# Patient Record
Sex: Female | Born: 1960 | Race: White | Hispanic: No | Smoking: Current every day smoker
Health system: Southern US, Community
[De-identification: ages and names within clinical notes are randomized; demographics above are authoritative.]

## PROBLEM LIST (undated history)

## (undated) DIAGNOSIS — H547 Unspecified visual loss: Secondary | ICD-10-CM

## (undated) DIAGNOSIS — N760 Acute vaginitis: Secondary | ICD-10-CM

## (undated) DIAGNOSIS — F419 Anxiety disorder, unspecified: Secondary | ICD-10-CM

## (undated) DIAGNOSIS — K219 Gastro-esophageal reflux disease without esophagitis: Secondary | ICD-10-CM

## (undated) DIAGNOSIS — F329 Major depressive disorder, single episode, unspecified: Secondary | ICD-10-CM

## (undated) DIAGNOSIS — R519 Headache, unspecified: Secondary | ICD-10-CM

## (undated) DIAGNOSIS — R51 Headache: Secondary | ICD-10-CM

## (undated) DIAGNOSIS — E041 Nontoxic single thyroid nodule: Secondary | ICD-10-CM

## (undated) DIAGNOSIS — F32A Depression, unspecified: Secondary | ICD-10-CM

## (undated) DIAGNOSIS — F4310 Post-traumatic stress disorder, unspecified: Secondary | ICD-10-CM

## (undated) DIAGNOSIS — R569 Unspecified convulsions: Secondary | ICD-10-CM

## (undated) HISTORY — PX: EYE SURGERY: SHX253

## (undated) HISTORY — PX: TUBAL LIGATION: SHX77

## (undated) HISTORY — PX: OTHER SURGICAL HISTORY: SHX169

## (undated) HISTORY — PX: HERNIA REPAIR: SHX51

## (undated) HISTORY — PX: COLONOSCOPY: SHX174

---

## 2003-10-28 HISTORY — PX: TONSILLECTOMY: SUR1361

## 2015-04-26 ENCOUNTER — Inpatient Hospital Stay (HOSPITAL_COMMUNITY)
Admission: AD | Admit: 2015-04-26 | Discharge: 2015-04-26 | Disposition: A | Discharge status: Home / Self Care | Payer: Medicaid - Out of State | Source: Ambulatory Visit | Attending: Obstetrics & Gynecology | Admitting: Obstetrics & Gynecology

## 2015-04-26 ENCOUNTER — Encounter (HOSPITAL_COMMUNITY): Payer: Self-pay | Admitting: *Deleted

## 2015-04-26 DIAGNOSIS — F419 Anxiety disorder, unspecified: Secondary | ICD-10-CM | POA: Insufficient documentation

## 2015-04-26 DIAGNOSIS — L02828 Furuncle of other sites: Secondary | ICD-10-CM | POA: Insufficient documentation

## 2015-04-26 DIAGNOSIS — N904 Leukoplakia of vulva: Secondary | ICD-10-CM

## 2015-04-26 DIAGNOSIS — H6692 Otitis media, unspecified, left ear: Secondary | ICD-10-CM | POA: Diagnosis not present

## 2015-04-26 DIAGNOSIS — F411 Generalized anxiety disorder: Secondary | ICD-10-CM

## 2015-04-26 DIAGNOSIS — Z87891 Personal history of nicotine dependence: Secondary | ICD-10-CM | POA: Diagnosis not present

## 2015-04-26 DIAGNOSIS — R42 Dizziness and giddiness: Secondary | ICD-10-CM

## 2015-04-26 DIAGNOSIS — N362 Urethral caruncle: Secondary | ICD-10-CM

## 2015-04-26 HISTORY — DX: Major depressive disorder, single episode, unspecified: F32.9

## 2015-04-26 HISTORY — DX: Post-traumatic stress disorder, unspecified: F43.10

## 2015-04-26 HISTORY — DX: Nontoxic single thyroid nodule: E04.1

## 2015-04-26 HISTORY — DX: Anxiety disorder, unspecified: F41.9

## 2015-04-26 HISTORY — DX: Depression, unspecified: F32.A

## 2015-04-26 HISTORY — DX: Acute vaginitis: N76.0

## 2015-04-26 HISTORY — DX: Headache: R51

## 2015-04-26 HISTORY — DX: Headache, unspecified: R51.9

## 2015-04-26 LAB — URINALYSIS, ROUTINE W REFLEX MICROSCOPIC
Bilirubin Urine: NEGATIVE
Glucose, UA: NEGATIVE mg/dL
HGB URINE DIPSTICK: NEGATIVE
Ketones, ur: NEGATIVE mg/dL
Leukocytes, UA: NEGATIVE
Nitrite: NEGATIVE
PROTEIN: NEGATIVE mg/dL
Urobilinogen, UA: 0.2 mg/dL (ref 0.0–1.0)
pH: 6 (ref 5.0–8.0)

## 2015-04-26 LAB — WET PREP, GENITAL
CLUE CELLS WET PREP: NONE SEEN
TRICH WET PREP: NONE SEEN
Yeast Wet Prep HPF POC: NONE SEEN

## 2015-04-26 LAB — POCT PREGNANCY, URINE: Preg Test, Ur: NEGATIVE

## 2015-04-26 MED ORDER — PSEUDOEPHEDRINE HCL 30 MG PO TABS: 30.0000 mg | ORAL_TABLET | ORAL | Status: DC | PRN

## 2015-04-26 MED ORDER — AMOXICILLIN-POT CLAVULANATE 875-125 MG PO TABS: 1.0000 | ORAL_TABLET | Freq: Two times a day (BID) | ORAL | Status: DC

## 2015-04-26 MED ORDER — MECLIZINE HCL 32 MG PO TABS: 32.0000 mg | ORAL_TABLET | Freq: Three times a day (TID) | ORAL | Status: DC | PRN
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
## 2015-04-26 NOTE — Discharge Instructions (Signed)
Benign Positional Vertigo °Vertigo means you feel like you or your surroundings are moving when they are not. Benign positional vertigo is the most common form of vertigo. Benign means that the cause of your condition is not serious. Benign positional vertigo is more common in older adults. °CAUSES  °Benign positional vertigo is the result of an upset in the labyrinth system. This is an area in the middle ear that helps control your balance. This may be caused by a viral infection, head injury, or repetitive motion. However, often no specific cause is found. °SYMPTOMS  °Symptoms of benign positional vertigo occur when you move your head or eyes in different directions. Some of the symptoms may include: °· Loss of balance and falls. °· Vomiting. °· Blurred vision. °· Dizziness. °· Nausea. °· Involuntary eye movements (nystagmus). °DIAGNOSIS  °Benign positional vertigo is usually diagnosed by physical exam. If the specific cause of your benign positional vertigo is unknown, your caregiver may perform imaging tests, such as magnetic resonance imaging (MRI) or computed tomography (CT). °TREATMENT  °Your caregiver may recommend movements or procedures to correct the benign positional vertigo. Medicines such as meclizine, benzodiazepines, and medicines for nausea may be used to treat your symptoms. In rare cases, if your symptoms are caused by certain conditions that affect the inner ear, you may need surgery. °HOME CARE INSTRUCTIONS  °· Follow your caregiver's instructions. °· Move slowly. Do not make sudden body or head movements. °· Avoid driving. °· Avoid operating heavy machinery. °· Avoid performing any tasks that would be dangerous to you or others during a vertigo episode. °· Drink enough fluids to keep your urine clear or pale yellow. °SEEK IMMEDIATE MEDICAL CARE IF:  °· You develop problems with walking, weakness, numbness, or using your arms, hands, or legs. °· You have difficulty speaking. °· You develop  severe headaches. °· Your nausea or vomiting continues or gets worse. °· You develop visual changes. °· Your family or friends notice any behavioral changes. °· Your condition gets worse. °· You have a fever. °· You develop a stiff neck or sensitivity to light. °MAKE SURE YOU:  °· Understand these instructions. °· Will watch your condition. °· Will get help right away if you are not doing well or get worse. °Document Released: 07/21/2006 Document Revised: 01/05/2012 Document Reviewed: 07/03/2011 °ExitCare® Patient Information ©2015 [employer]. This information is not intended to replace advice given to you by your health care provider. Make sure you discuss any questions you have with your health care provider. ° °Dizziness ° Dizziness means you feel unsteady or lightheaded. You might feel like you are going to pass out (faint). °HOME CARE  °· Drink enough fluids to keep your pee (urine) clear or pale yellow. °· Take your medicines exactly as told by your doctor. If you take blood pressure medicine, always stand up slowly from the lying or sitting position. Hold on to something to steady yourself. °· If you need to stand in one place for a long time, move your legs often. Tighten and relax your leg muscles. °· Have someone stay with you until you feel okay. °· Do not drive or use heavy machinery if you feel dizzy. °· Do not drink alcohol. °GET HELP RIGHT AWAY IF:  °· You feel dizzy or lightheaded and it gets worse. °· You feel sick to your stomach (nauseous), or you throw up (vomit). °· You have trouble talking or walking. °· You feel weak or have trouble using your arms, hands,   or legs.  You cannot think clearly or have trouble forming sentences.  You have chest pain, belly (abdominal) pain, sweating, or you are short of breath.  Your vision changes.  You are bleeding.  You have problems from your medicine that seem to be getting worse. MAKE SURE YOU:   Understand these instructions.  Will watch  your condition.  Will get help right away if you are not doing well or get worse. Document Released: 10/02/2011 Document Revised: 01/05/2012 Document Reviewed: 10/02/2011 [facility] Patient Information 2015 [place], [place]. This information is not intended to replace advice given to you by your health care provider. Make sure you discuss any questions you have with your health care provider.  Otitis Media Otitis media is redness, soreness, and inflammation of the middle ear. Otitis media may be caused by allergies or, most commonly, by infection. Often it occurs as a complication of the common cold. SIGNS AND SYMPTOMS Symptoms of otitis media may include:  Earache.  Fever.  Ringing in your ear.  Headache.  Leakage of fluid from the ear. DIAGNOSIS To diagnose otitis media, your health care provider will examine your ear with an otoscope. This is an instrument that allows your health care provider to see into your ear in order to examine your eardrum. Your health care provider also will ask you questions about your symptoms. TREATMENT  Typically, otitis media resolves on its own within 3-5 days. Your health care provider may prescribe medicine to ease your symptoms of pain. If otitis media does not resolve within 5 days or is recurrent, your health care provider may prescribe antibiotic medicines if he or she suspects that a bacterial infection is the cause. HOME CARE INSTRUCTIONS   If you were prescribed an antibiotic medicine, finish it all even if you start to feel better.  Take medicines only as directed by your health care provider.  Keep all follow-up visits as directed by your health care provider. SEEK MEDICAL CARE IF:  You have otitis media only in one ear, or bleeding from your nose, or both.  You notice a lump on your neck.  You are not getting better in 3-5 days.  You feel worse instead of better. SEEK IMMEDIATE MEDICAL CARE IF:   You have pain that is not controlled  with medicine.  You have swelling, redness, or pain around your ear or stiffness in your neck.  You notice that part of your face is paralyzed.  You notice that the bone behind your ear (mastoid) is tender when you touch it. MAKE SURE YOU:   Understand these instructions.  Will watch your condition.  Will get help right away if you are not doing well or get worse. Document Released: 07/18/2004 Document Revised: 02/27/2014 Document Reviewed: 05/10/2013 [facility] Patient Information 2015 [place], [place]. This information is not intended to replace advice given to you by your health care provider. Make sure you discuss any questions you have with your health care provider. Lichen Sclerosus Lichen sclerosus is a skin problem. It can happen on any part of the body, but it commonly involves the anal or genital areas. Lichen sclerosus is not an infection or a fungus. Girls and women are more commonly affected than boys and men. CAUSES The cause is not known. It could be the result of an overactive immune system or a lack of certain hormones. Lichen sclerosus is not passed from one person to another (not contagious). SYMPTOMS Your skin may have:  Thin, wrinkled, white areas.  Thickened white areas.  Red and swollen patches.  Tears or cracks.  Bruising.  Blood blisters.  Severe itching. You may also have pain, itching, or burning with urination. Constipation is also common in people with lichen sclerosus. DIAGNOSIS Your caregiver will do a physical exam. Sometimes, a tissue sample (biopsy) may be sent for testing. TREATMENT Treatment may involve putting a thin layer of medicated cream (topical steroid) over the areas with lichen sclerosus. Use the cream only as directed by your caregiver.  HOME CARE INSTRUCTIONS  Only take over-the-counter or prescription medicines as directed by your caregiver.  Keep the vaginal area as clean and dry as possible. SEEK MEDICAL CARE IF: You  develop increasing pain, swelling, or redness. Document Released: 03/05/2011 Document Revised: 01/05/2012 Document Reviewed: 03/05/2011 [facility] Patient Information 2015 [place], [place]. This information is not intended to replace advice given to you by your health care provider. Make sure you discuss any questions you have with your health care provider.

## 2015-04-26 NOTE — MAU Note (Signed)
Urine in lab 
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
## 2015-04-26 NOTE — MAU Note (Signed)
Cherry red urinary meatus for 4 wks, [facility] for a bad vulvar yeast infection- not internal, just external.  [facility]

## 2015-04-26 NOTE — MAU Note (Signed)
136/82     61    Standing   126/93     66    Sitting  108/82     75     Standing

## 2015-05-30 ENCOUNTER — Encounter: Payer: Self-pay | Admitting: Obstetrics & Gynecology

## 2015-05-30 ENCOUNTER — Ambulatory Visit (INDEPENDENT_AMBULATORY_CARE_PROVIDER_SITE_OTHER): Payer: Medicaid - Out of State | Admitting: Obstetrics & Gynecology

## 2015-05-30 VITALS — BP 119/86 | HR 78 | Temp 98.7°F | Wt 211.5 lb

## 2015-05-30 DIAGNOSIS — L900 Lichen sclerosus et atrophicus: Secondary | ICD-10-CM

## 2015-05-30 DIAGNOSIS — B372 Candidiasis of skin and nail: Secondary | ICD-10-CM

## 2015-05-30 DIAGNOSIS — B373 Candidiasis of vulva and vagina: Secondary | ICD-10-CM

## 2015-05-30 LAB — POCT PREGNANCY, URINE: Preg Test, Ur: NEGATIVE

## 2015-05-30 MED ORDER — FLUCONAZOLE 150 MG PO TABS: 150.0000 mg | ORAL_TABLET | Freq: Once | ORAL | Status: DC

## 2015-05-30 MED ORDER — CLOBETASOL PROPIONATE 0.05 % EX CREA: TOPICAL_CREAM | CUTANEOUS | Status: DC

## 2015-05-30 NOTE — Patient Instructions (Signed)
Return to clinic for any scheduled appointments or for any gynecologic concerns as needed.   

## 2015-05-30 NOTE — Progress Notes (Signed)
   CLINIC ENCOUNTER NOTE  History:  54 y.o. [id number] here today for follow up after being evaluated in MAU on 04/26/15 for presumed lichen sclerosus and a urethral furuncle. She has been using clobetasol ointment and some herbal remedies which have helped her symptoms.  Reports having a perianal yeast infection, wants treatment. She denies any abnormal vaginal discharge, bleeding, pelvic pain or other concerns.   Past Medical History  Diagnosis Date  . Anxiety   . Depression   . PTSD (post-traumatic stress disorder)   . Headache     migraines  . Thyroid nodule   . Vaginal infection     Past Surgical History  Procedure Laterality Date  . Tubal ligation    . Hernia repair     The following portions of the patient's history were reviewed and updated as appropriate: allergies, current medications, past family history, past medical history, past social history, past surgical history and problem list.   Health Maintenance:  Normal pap and negative HRHPV on 10/2014.  Normal mammogram on 07/2015.   Review of Systems:  Pertinent items are noted in HPI. Comprehensive review of systems was otherwise negative.  Objective:  Physical Exam BP 119/86 mmHg  Pulse 78  Temp(Src) 98.7 F (37.1 C)  Wt 211 lb 8 oz (95.936 kg)  SpO2 97% CONSTITUTIONAL: Well-developed, well-nourished female in no acute distress.  HENT:  Normocephalic, atraumatic. External right and left ear normal. Oropharynx is clear and moist EYES: Conjunctivae and EOM are normal. Pupils are equal, round, and reactive to light. No scleral icterus.  NECK: Normal range of motion, supple, no masses SKIN: Skin is warm and dry. No rash noted. Not diaphoretic. No erythema. No pallor. NEUROLGIC: Alert and oriented to person, place, and time. Normal reflexes, muscle tone coordination. No cranial nerve deficit noted. PSYCHIATRIC: Normal mood and affect. Normal behavior. Normal judgment and thought content. CARDIOVASCULAR: Normal heart  rate noted RESPIRATORY: Effort and breath sounds normal, no problems with respiration noted ABDOMEN: Soft, no distention noted.   MUSCULOSKELETAL: Normal range of motion. No edema noted. PELVIC: Improved vulvar exam compared to 04/26/15.  Vulva has less loss of pigmentation and even urethral furuncle has improved. No erythema.   No abnormal discharge noted.   Perianal erythema noted concerning for candidal infection.  Assessment & Plan:  Improved vulvar exam s/p clobetasol consistent with lichen sclerosus No indication for biopsy today, will continue to follow. Diflucan prescribed for perianal erythema, she will call/come in for worsening symptoms. Return in 3-6 months for re-evaluation. Routine preventative health maintenance measures emphasized. Please refer to After Visit Summary for other counseling recommendations.    Jaynie Collins, MD, FACOG Attending Obstetrician & Gynecologist Center for [employer], [facility]

## 2015-06-11 ENCOUNTER — Emergency Department (HOSPITAL_COMMUNITY)
Admission: EM | Admit: 2015-06-11 | Discharge: 2015-06-11 | Disposition: A | Discharge status: Home / Self Care | Payer: No Typology Code available for payment source | Attending: Emergency Medicine | Admitting: Emergency Medicine

## 2015-06-11 ENCOUNTER — Emergency Department (HOSPITAL_COMMUNITY): Payer: No Typology Code available for payment source

## 2015-06-11 ENCOUNTER — Encounter (HOSPITAL_COMMUNITY): Payer: Self-pay | Admitting: Emergency Medicine

## 2015-06-11 DIAGNOSIS — F329 Major depressive disorder, single episode, unspecified: Secondary | ICD-10-CM | POA: Diagnosis not present

## 2015-06-11 DIAGNOSIS — S59901A Unspecified injury of right elbow, initial encounter: Secondary | ICD-10-CM | POA: Diagnosis not present

## 2015-06-11 DIAGNOSIS — Y998 Other external cause status: Secondary | ICD-10-CM | POA: Diagnosis not present

## 2015-06-11 DIAGNOSIS — F419 Anxiety disorder, unspecified: Secondary | ICD-10-CM | POA: Diagnosis not present

## 2015-06-11 DIAGNOSIS — Y9289 Other specified places as the place of occurrence of the external cause: Secondary | ICD-10-CM | POA: Insufficient documentation

## 2015-06-11 DIAGNOSIS — Z79899 Other long term (current) drug therapy: Secondary | ICD-10-CM | POA: Diagnosis not present

## 2015-06-11 DIAGNOSIS — Z8639 Personal history of other endocrine, nutritional and metabolic disease: Secondary | ICD-10-CM | POA: Insufficient documentation

## 2015-06-11 DIAGNOSIS — S52121A Displaced fracture of head of right radius, initial encounter for closed fracture: Secondary | ICD-10-CM

## 2015-06-11 DIAGNOSIS — S4991XA Unspecified injury of right shoulder and upper arm, initial encounter: Secondary | ICD-10-CM | POA: Diagnosis present

## 2015-06-11 DIAGNOSIS — W01198A Fall on same level from slipping, tripping and stumbling with subsequent striking against other object, initial encounter: Secondary | ICD-10-CM | POA: Insufficient documentation

## 2015-06-11 DIAGNOSIS — S6991XA Unspecified injury of right wrist, hand and finger(s), initial encounter: Secondary | ICD-10-CM | POA: Insufficient documentation

## 2015-06-11 DIAGNOSIS — Z8742 Personal history of other diseases of the female genital tract: Secondary | ICD-10-CM | POA: Diagnosis not present

## 2015-06-11 DIAGNOSIS — Z87891 Personal history of nicotine dependence: Secondary | ICD-10-CM | POA: Diagnosis not present

## 2015-06-11 DIAGNOSIS — Y9389 Activity, other specified: Secondary | ICD-10-CM | POA: Insufficient documentation

## 2015-06-11 MED ORDER — OXYCODONE-ACETAMINOPHEN 5-325 MG PO TABS: 2.0000 | ORAL_TABLET | Freq: Once | ORAL | Status: DC

## 2015-06-11 MED ORDER — ONDANSETRON HCL 4 MG/2ML IJ SOLN
4.0000 mg | Freq: Once | INTRAMUSCULAR | Status: DC
  Filled 2015-06-11: qty 2

## 2015-06-11 MED ORDER — OXYCODONE-ACETAMINOPHEN 5-325 MG PO TABS
2.0000 | ORAL_TABLET | Freq: Once | ORAL | Status: AC
  Administered 2015-06-11: 2 via ORAL
  Filled 2015-06-11: qty 2

## 2015-06-11 MED ORDER — OXYCODONE-ACETAMINOPHEN 5-325 MG PO TABS: 2.0000 | ORAL_TABLET | ORAL | Status: DC | PRN

## 2015-06-11 MED ORDER — HYDROMORPHONE HCL 1 MG/ML IJ SOLN
2.0000 mg | Freq: Once | INTRAMUSCULAR | Status: DC
  Filled 2015-06-11: qty 2

## 2015-06-11 NOTE — ED Notes (Signed)
Transported patient to Xray

## 2015-06-11 NOTE — Discharge Instructions (Signed)
Radial Head Fracture °A radial head fracture is a break of the smaller bone (radius) in the forearm. The head of this bone is the part near the elbow. These fractures commonly happen during a fall, when you land on an outstretched arm. These fractures are more common in middle aged adults and are common with a dislocation of the elbow. °SYMPTOMS  °· Swelling of the elbow joint and pain on the outside of the elbow. °· Pain and difficulty in bending or straightening the elbow. °· Pain and difficulty in turning the palm of the hand up or down with the elbow bent. °DIAGNOSIS  °Your caregiver may make this diagnosis by a physical exam. X-rays can confirm the type and amount of fracture. Sometimes a fracture that is not displaced cannot be seen on the original X-ray. °TREATMENT  °Radial head fractures are classified according to the amount of movement (displacement) of parts from the normal position.  °Type 1 Fractures °· Type 1 fractures are generally small fractures in which bone pieces remain together (nondisplaced fracture). °· The fracture may not be seen on initial X-rays. Usually if X-rays are repeated two to three weeks later, the fracture will show up. A splint or sling is used for a few days. Gentle early motion is used to prevent the elbow from becoming stiff. It should not be done vigorously or forced as this could displace the bone pieces. °Type 2 Fractures °· With type 2 fractures, bone pieces are slightly displaced and larger pieces of bone are broken off. °· If only a little displacement of the bone piece is present, splinting for 4 to 5 days usually works well. This is again followed with gentle active range of motion. Small fragments may be surgically removed. °· Large pieces of bone that can be put back into place will sometimes be fixed with pins or screws to hold them until the bone is healed. If this cannot be done, the fragments are removed. For older, less active people, sometimes the entire radial  head is removed if the wrist is not injured. The elbow and arm will still work fine. Soft tissue, tendon, and ligament injuries are corrected at the same time. °Type 3 Fractures °· Type 3 fractures have multiple broken pieces of bone that cannot be fixed. Surgery is usually needed to remove the broken bits of bone and what is left of the radial head. Soft-tissue damage is repaired. Gentle early motion is used to prevent the elbow from becoming stiff. Sometimes an artificial radial head can be used to prevent deformity if the elbow is unstable. °Rest, ice, elevation, immobilization, medications, and pain control are used in the early care. °HOME CARE INSTRUCTIONS  °· Keep the injured part elevated while sitting or lying down. Keep the injury above the level of your heart (the center of the chest). This will decrease swelling and pain. °· Apply ice to the injury for 15-20 minutes, 03-04 times per day while awake, for 2 days. Put the ice in a plastic bag and place a towel between the bag of ice and your cast or splint. °· Move your fingers to avoid stiffness and minimize swelling. °· If you have a plaster or fiberglass cast: °¨ Do not try to scratch the skin under the cast using sharp or pointed objects. °¨ Check the skin around the cast every day. You may put lotion on any red or sore areas. °¨ Keep your cast dry and clean. °· If you have a plaster splint: °¨   Wear the splint as directed. °¨ You may loosen the elastic around the splint if your fingers become numb, tingle, or turn cold or blue. °· Do not put pressure on any part of your cast or splint. It may break. Rest your cast only on a pillow for the first 24 hours until it is fully hardened. °· Your cast or splint can be protected during bathing with a plastic bag. Do not lower the cast or splint into the water. °· Only take over-the-counter or prescription medicines for pain, discomfort, or fever as directed by your caregiver. °· Follow all instructions for  follow-up with your caregiver. This includes any orthopedic referrals, physical therapy, and rehabilitation. Any delay in obtaining necessary care could result in a delay or failure of the bones to heal or permanent elbow stiffness. °· Do not overdo exercises. This could further damage your injury. °SEEK IMMEDIATE MEDICAL CARE IF:  °· Your cast or splint gets damaged or breaks. °· You have more severe pain or swelling than you did before getting the cast. °· You have severe pain when stretching your fingers. °· There is a bad smell, new stains, and/or pus-like (purulent) drainage coming from under the cast. °· Your fingers or hand turn pale or blue, become cold, or you lose feeling. °Document Released: 08/04/2006 Document Revised: 02/27/2014 Document Reviewed: 09/11/2009 °[employer]® Patient Information ©2015 [employer]. This information is not intended to replace advice given to you by your health care provider. Make sure you discuss any questions you have with your health care provider. ° °

## 2015-06-11 NOTE — Progress Notes (Signed)
Orthopedic Tech Progress Note Patient Details:  Sandra Herring 10-03-1961 [id number]  Ortho Devices Type of Ortho Device: Ace wrap, Arm sling, Post (long arm) splint Ortho Device/Splint Location: RUE Ortho Device/Splint Interventions: Ordered, Application   Jennye Moccasin 06/11/2015, 7:48 PM

## 2015-06-11 NOTE — ED Provider Notes (Signed)
CSN: [id number]     Arrival date & time 06/11/15  1723 History  This chart was scribed for Trisha Mangle, PA-C, working with Lyndal Pulley, MD by Elon Spanner, ED Scribe. This patient was seen in room TR10C/TR10C and the patient's care was started at 5:33 PM.   No chief complaint on file.  The history is provided by the patient. No language interpreter was used.   HPI Comments: Sandra Herring is a 54 y.o. female who presents to the Emergency Department complaining of constant, unchanged, severe right elbow and wrist pain onset PTA after a fall.  The patient was a gas station when she tripped and fell, catching herself on an outstretched right hand.  She currently takes sertraline, valium, trazadone, and propanolol.  Patient is new to the area and does not have primary care.  NKA.    Past Medical History  Diagnosis Date  . Anxiety   . Depression   . PTSD (post-traumatic stress disorder)   . Headache     migraines  . Thyroid nodule   . Vaginal infection    Past Surgical History  Procedure Laterality Date  . Tubal ligation    . Hernia repair     No family history on file. Social History  Substance Use Topics  . Smoking status: Former Smoker -- 0.25 packs/day    Quit date: 10/20/2014  . Smokeless tobacco: Never Used  . Alcohol Use: Yes   OB History    Gravida Para Term Preterm AB TAB SAB Ectopic Multiple Living   Review of Systems  Constitutional: Negative for fever.  Musculoskeletal: Positive for joint swelling and arthralgias.      Allergies  Serzone  Home Medications   Prior to Admission medications   Medication Sig Start Date End Date Taking? Authorizing Provider  acetaminophen (TYLENOL) 500 MG tablet Take 1,000 mg by mouth every 6 (six) hours as needed for moderate pain.    Historical Provider, MD  amoxicillin-clavulanate (AUGMENTIN) 875-125 MG per tablet Take 1 tablet by mouth every 12 (twelve) hours. Patient not taking: Reported on 05/30/2015  04/26/15   Aviva Signs, CNM  clobetasol cream (TEMOVATE) 0.05 % Apply to affected area two-three times a week 05/30/15   Tereso Newcomer, MD  diazepam (VALIUM) 5 MG tablet Take 2.5-5 mg by mouth as needed for anxiety.    Historical Provider, MD  diphenhydrAMINE (BENADRYL) 25 MG tablet Take 25 mg by mouth every 6 (six) hours as needed for allergies.    Historical Provider, MD  famotidine (PEPCID) 20 MG tablet Take 20 mg by mouth 2 (two) times daily.    Historical Provider, MD  fluconazole (DIFLUCAN) 150 MG tablet Take 1 tablet (150 mg total) by mouth once. Can take additional dose three days later if symptoms persist 05/30/15   Tereso Newcomer, MD  meclizine (ANTIVERT) 32 MG tablet Take 1 tablet (32 mg total) by mouth 3 (three) times daily as needed. Patient not taking: Reported on 05/30/2015 04/26/15   Aviva Signs, CNM  propranolol (INDERAL) 20 MG tablet Take 20 mg by mouth daily.    Historical Provider, MD  pseudoephedrine (SUDAFED) 30 MG tablet Take 1 tablet (30 mg total) by mouth every 4 (four) hours as needed for congestion. Patient not taking: Reported on 05/30/2015 04/26/15   Aviva Signs, CNM  sertraline (ZOLOFT) 25 MG tablet Take 25-50 mg by mouth daily. Patient is  working up to 50mg  Zoloft, took 25mg  today    Ecologist, MD  traZODone (DESYREL) 50 MG tablet Take 50 mg by mouth at bedtime as needed for sleep.    Historical Provider, MD   There were no vitals taken for this visit. Physical Exam  Constitutional: She is oriented to person, place, and time. She appears well-developed and well-nourished. No distress.  HENT:  Head: Normocephalic and atraumatic.  Eyes: Conjunctivae and EOM are normal.  Neck: Neck supple. No tracheal deviation present.  Cardiovascular: Normal rate.   Pulmonary/Chest: Effort normal. No respiratory distress.  Musculoskeletal: Normal range of motion.  Tender to right elbow, wrist.  Pain with ROM.  NVI.  Neurological: She is alert and oriented to  person, place, and time.  Skin: Skin is warm and dry.  Psychiatric: She has a normal mood and affect. Her behavior is normal.  Nursing note and vitals reviewed.   ED Course  Procedures (including critical care time)  DIAGNOSTIC STUDIES: Oxygen Saturation is 97% on RA, normal by my interpretation.    COORDINATION OF CARE:  5:40 PM Discussed treatment plan with patient at bedside.  Patient acknowledges and agrees with plan.    Labs Review Labs Reviewed - No data to display  Imaging Review Dg Elbow Complete Right  06/11/2015   CLINICAL DATA:  Fall today on out stetched right arm. Right arm pain from distal humerus to wrist. Pt in a lot of pain, unable to bend elbow to 90 degrees  EXAM: RIGHT ELBOW - COMPLETE 3+ VIEW  COMPARISON:  None.  FINDINGS: There is a fracture through the radial head, with a fracture line extending across the articular surface in an oblique sagittal plane and another fracture line across the radial neck. No significant fracture comminution or displacement. No other fractures. Elbow joint is normally aligned. There is a joint effusion.  IMPRESSION: Nondisplaced fracture of the radial head.   Electronically Signed   By: Amie Portland M.D.   On: 06/11/2015 18:36   Dg Forearm Right  06/11/2015   CLINICAL DATA:  Fall on outstretched right arm. Right forearm pain. Initial encounter.  EXAM: RIGHT FOREARM - 2 VIEW  COMPARISON:  None.  FINDINGS: Minimally displaced radial head fracture is demonstrated. No other fracture seen involving the radius or ulna. No evidence of dislocation.  IMPRESSION: Minimally displaced radial head fracture.   Electronically Signed   By: Myles Rosenthal M.D.   On: 06/11/2015 18:37   Dg Wrist Complete Right  06/11/2015   CLINICAL DATA:  Fall on outstretched arm injury. Arm pain extending from humerus to wrist.  EXAM: RIGHT WRIST - COMPLETE 3+ VIEW  COMPARISON:  None.  FINDINGS: There is no evidence of fracture or dislocation. There is no evidence of  arthropathy or other focal bone abnormality. Soft tissues are unremarkable.  IMPRESSION: Negative.   Electronically Signed   By: Andreas Newport M.D.   On: 06/11/2015 18:39   I personally reviewed and evaluated these images and lab results as part of my medical decision-making.   EKG Interpretation None      MDM   Final diagnoses:  Radial head fracture, closed, right, initial encounter    Pt placed in a splint and sling Care Management spoke with pt about follow up Percocet   I personally performed the services in this documentation, which was scribed in my presence.  The recorded information has been reviewed and considered.   Barnet Pall.  Lonia Skinner [place], PA-C 06/12/15 [phone]  Lyndal Pulley, MD 06/12/15 [phone]

## 2015-06-11 NOTE — ED Notes (Signed)
Pt was walking and tripped and broke her fall w/ her right arm.

## 2015-07-11 ENCOUNTER — Encounter: Payer: Self-pay | Admitting: Obstetrics & Gynecology

## 2015-11-01 ENCOUNTER — Emergency Department (HOSPITAL_COMMUNITY): Payer: BLUE CROSS/BLUE SHIELD

## 2015-11-01 ENCOUNTER — Emergency Department (HOSPITAL_COMMUNITY)
Admission: EM | Admit: 2015-11-01 | Discharge: 2015-11-01 | Disposition: A | Discharge status: Home / Self Care | Payer: BLUE CROSS/BLUE SHIELD | Attending: Emergency Medicine | Admitting: Emergency Medicine

## 2015-11-01 ENCOUNTER — Encounter (HOSPITAL_COMMUNITY): Payer: Self-pay | Admitting: Neurology

## 2015-11-01 DIAGNOSIS — F329 Major depressive disorder, single episode, unspecified: Secondary | ICD-10-CM | POA: Diagnosis not present

## 2015-11-01 DIAGNOSIS — Z8742 Personal history of other diseases of the female genital tract: Secondary | ICD-10-CM | POA: Diagnosis not present

## 2015-11-01 DIAGNOSIS — R112 Nausea with vomiting, unspecified: Secondary | ICD-10-CM | POA: Diagnosis not present

## 2015-11-01 DIAGNOSIS — R1013 Epigastric pain: Secondary | ICD-10-CM

## 2015-11-01 DIAGNOSIS — F419 Anxiety disorder, unspecified: Secondary | ICD-10-CM | POA: Diagnosis not present

## 2015-11-01 DIAGNOSIS — Z79899 Other long term (current) drug therapy: Secondary | ICD-10-CM | POA: Diagnosis not present

## 2015-11-01 DIAGNOSIS — Z8639 Personal history of other endocrine, nutritional and metabolic disease: Secondary | ICD-10-CM | POA: Insufficient documentation

## 2015-11-01 DIAGNOSIS — Z87891 Personal history of nicotine dependence: Secondary | ICD-10-CM | POA: Insufficient documentation

## 2015-11-01 DIAGNOSIS — R109 Unspecified abdominal pain: Secondary | ICD-10-CM | POA: Diagnosis present

## 2015-11-01 LAB — COMPREHENSIVE METABOLIC PANEL
ALT: 23 U/L (ref 14–54)
AST: 19 U/L (ref 15–41)
Albumin: 4.3 g/dL (ref 3.5–5.0)
Alkaline Phosphatase: 70 U/L (ref 38–126)
Anion gap: 8 (ref 5–15)
BUN: 9 mg/dL (ref 6–20)
CHLORIDE: 103 mmol/L (ref 101–111)
CO2: 29 mmol/L (ref 22–32)
Calcium: 9.7 mg/dL (ref 8.9–10.3)
Creatinine, Ser: 0.82 mg/dL (ref 0.44–1.00)
GFR calc Af Amer: >60 mL/min (ref >60)
GFR calc non Af Amer: >60 mL/min (ref >60)
Glucose, Bld: 111 mg/dL — ABNORMAL HIGH (ref 65–99)
POTASSIUM: 4.1 mmol/L (ref 3.5–5.1)
Sodium: 140 mmol/L (ref 135–145)
Total Bilirubin: 0.6 mg/dL (ref 0.3–1.2)
Total Protein: 7 g/dL (ref 6.5–8.1)

## 2015-11-01 LAB — CBC
HEMATOCRIT: 39.5 % (ref 36.0–46.0)
Hemoglobin: 12.8 g/dL (ref 12.0–15.0)
MCH: 29.1 pg (ref 26.0–34.0)
MCHC: 32.4 g/dL (ref 30.0–36.0)
MCV: 89.8 fL (ref 78.0–100.0)
Platelets: 212 10*3/uL (ref 150–400)
RBC: 4.4 MIL/uL (ref 3.87–5.11)
RDW: 12.3 % (ref 11.5–15.5)
WBC: 5.4 10*3/uL (ref 4.0–10.5)

## 2015-11-01 LAB — I-STAT TROPONIN, ED: TROPONIN I, POC: 0 ng/mL (ref 0.00–0.08)

## 2015-11-01 LAB — LIPASE, BLOOD: LIPASE: 34 U/L (ref 11–51)

## 2015-11-01 MED ORDER — GI COCKTAIL ~~LOC~~
30.0000 mL | Freq: Once | ORAL | Status: AC
  Administered 2015-11-01: 30 mL via ORAL
  Filled 2015-11-01: qty 30

## 2015-11-01 MED ORDER — LIDOCAINE VISCOUS 2 % MT SOLN: 15.0000 mL | Freq: Four times a day (QID) | OROMUCOSAL | Status: DC | PRN

## 2015-11-01 MED ORDER — PANTOPRAZOLE SODIUM 20 MG PO TBEC: 20.0000 mg | DELAYED_RELEASE_TABLET | Freq: Every day | ORAL | Status: DC

## 2015-11-01 NOTE — ED Provider Notes (Signed)
CSN: [id number]     Arrival date & time 11/01/15  0913 History  By signing my name below, I, Freida Busman, attest that this documentation has been prepared under the direction and in the presence of non-physician practitioner, Rhea Bleacher PA-C. Electronically Signed: Freida Busman, Scribe. 11/01/2015. 2:50 PM.    Chief Complaint  Patient presents with  . Abdominal Pain    The history is provided by the patient. No language interpreter was used.    HPI Comments:  Sandra Herring is a 55 y.o. female with a history of hernia repair (1994), who presents to the Emergency Department complaining of non-radiating burning mid abdominal pain. She notes her pain began after taking levaquin and prednisone which she was placed on for PNA in October 2016. She notes in the past prednisone has caused gastritis.  Pain is most severe in the middle the night. She reports associated sporadic episodes of nausea and vomiting. She was recently evaluated at Urgent Care for the pain, had negative H. Pylori and was given GI cocktail with relief. She was discharged with dexilant which provided no relief. She also notes her pain is improved after eating and she often wakes up with sour taste in her mouth. She reports h/o gallbladder issues. No fever or diarrhea.     Past Medical History  Diagnosis Date  . Anxiety   . Depression   . PTSD (post-traumatic stress disorder)   . Headache     migraines  . Thyroid nodule   . Vaginal infection    Past Surgical History  Procedure Laterality Date  . Tubal ligation    . Hernia repair     No family history on file. Social History  Substance Use Topics  . Smoking status: Former Smoker -- 0.25 packs/day    Quit date: 10/20/2014  . Smokeless tobacco: Never Used  . Alcohol Use: Yes     Comment: occ   OB History    Gravida Para Term Preterm AB TAB SAB Ectopic Multiple Living   3 3 3       3      Review of Systems  Constitutional: Negative for fever.  HENT: Positive  for trouble swallowing (No regurgitation). Negative for rhinorrhea and sore throat.   Eyes: Negative for redness.  Respiratory: Negative for cough.   Cardiovascular: Negative for chest pain.  Gastrointestinal: Positive for nausea, vomiting and abdominal pain. Negative for diarrhea.  Genitourinary: Negative for dysuria.  Musculoskeletal: Negative for myalgias.  Skin: Negative for rash.  Neurological: Negative for headaches.   Allergies  Serzone  Home Medications   Prior to Admission medications   Medication Sig Start Date End Date Taking? Authorizing Provider  acetaminophen (TYLENOL) 500 MG tablet Take 1,000 mg by mouth every 6 (six) hours as needed for moderate pain.    Historical Provider, MD  amoxicillin-clavulanate (AUGMENTIN) 875-125 MG per tablet Take 1 tablet by mouth every 12 (twelve) hours. Patient not taking: Reported on 05/30/2015 04/26/15   Aviva Signs, CNM  clobetasol cream (TEMOVATE) 0.05 % Apply to affected area two-three times a week 05/30/15   Tereso Newcomer, MD  diazepam (VALIUM) 5 MG tablet Take 2.5-5 mg by mouth as needed for anxiety.    Historical Provider, MD  diphenhydrAMINE (BENADRYL) 25 MG tablet Take 25 mg by mouth every 6 (six) hours as needed for allergies.    Historical Provider, MD  famotidine (PEPCID) 20 MG tablet Take 20 mg by mouth 2 (two) times daily.  Historical Provider, MD  fluconazole (DIFLUCAN) 150 MG tablet Take 1 tablet (150 mg total) by mouth once. Can take additional dose three days later if symptoms persist 05/30/15   Tereso NewcomerUgonna A Anyanwu, MD  meclizine (ANTIVERT) 32 MG tablet Take 1 tablet (32 mg total) by mouth 3 (three) times daily as needed. Patient not taking: Reported on 05/30/2015 04/26/15   Aviva SignsMarie L Williams, CNM  oxyCODONE-acetaminophen (PERCOCET/ROXICET) 5-325 MG per tablet Take 2 tablets by mouth every 4 (four) hours as needed for severe pain. 06/11/15   Elson AreasLeslie K Sofia, PA-C  propranolol (INDERAL) 20 MG tablet Take 20 mg by mouth daily.     Historical Provider, MD  pseudoephedrine (SUDAFED) 30 MG tablet Take 1 tablet (30 mg total) by mouth every 4 (four) hours as needed for congestion. Patient not taking: Reported on 05/30/2015 04/26/15   Aviva SignsMarie L Williams, CNM  sertraline (ZOLOFT) 25 MG tablet Take 25-50 mg by mouth daily. Patient is working up to 50mg  Zoloft, took 25mg  today    EcologistHistorical Provider, MD  traZODone (DESYREL) 50 MG tablet Take 50 mg by mouth at bedtime as needed for sleep.    Historical Provider, MD   BP 134/96 mmHg  Pulse 65  Temp(Src) 97.9 F (36.6 C) (Oral)  Resp 22  Ht 5' 8.5" (1.74 m)  Wt 224 lb (101.606 kg)  BMI 33.56 kg/m2  SpO2 96%   Physical Exam  Constitutional: She is oriented to person, place, and time. She appears well-developed and well-nourished. No distress.  HENT:  Head: Normocephalic and atraumatic.  Mouth/Throat: Oropharynx is clear and moist.  Eyes: Conjunctivae are normal. Right eye exhibits no discharge. Left eye exhibits no discharge.  Neck: Normal range of motion. Neck supple.  Cardiovascular: Normal rate, regular rhythm and normal heart sounds.   No murmur heard. Pulmonary/Chest: Effort normal and breath sounds normal. No respiratory distress. She has no wheezes. She has no rales.  Abdominal: Soft. She exhibits no distension. There is tenderness (epigastrium, mild to moderate).  Neurological: She is alert and oriented to person, place, and time.  Skin: Skin is warm and dry.  Psychiatric: She has a normal mood and affect.  Nursing note and vitals reviewed.   ED Course  Procedures   DIAGNOSTIC STUDIES:  Oxygen Saturation is 96% on RA, normal by my interpretation.    COORDINATION OF CARE:  2:32 PM Will order abdominal US and GI cocktail and discharge with PPI. Discussed treatment plan with pt at bedside and pt agreed to plan.  Labs Review Labs Reviewed  COMPREHENSIVE METABOLIC PANEL - Abnormal; Notable for the following:    Glucose, Bld 111 (*)    All other components  within normal limits  LIPASE, BLOOD  CBC  I-STAT TROPOININ, ED    Imaging Review [place] Abdomen Complete  11/01/2015  CLINICAL DATA:  Upper abdominal pain for 3 months EXAM: ABDOMEN ULTRASOUND COMPLETE COMPARISON:  None FINDINGS: Gallbladder: Normally distended without stones or wall thickening. No pericholecystic fluid or sonographic Murphy sign. Common bile duct: Diameter: 2 mm diameter , normal Liver: Normal appearance IVC: Normal appearance Pancreas: Normal appearance Spleen: Normal appearance, 8.3 cm length Right Kidney: Length: 12.1 cm. Normal morphology without mass or hydronephrosis. Left Kidney: Length: 11.5 cm. Normal morphology without mass or hydronephrosis. Abdominal aorta: Mild atherosclerotic changes.  Normal caliber. Other findings: No free fluid IMPRESSION: No acute upper abdominal sonographic abnormalities. Electronically Signed   By: Ulyses SouthwardMark  Boles M.D.   On: 11/01/2015 16:29   I have personally reviewed  and evaluated these images and lab results as part of my medical decision-making.  Vital signs reviewed and are as follows: Filed Vitals:   11/01/15 0949  BP: 134/96  Pulse: 65  Temp: 97.9 F (36.6 C)  Resp: 22   5:03 PM ultrasound is unremarkable. Will start on Protonix and give viscous lidocaine. Patient states that her primary care doctor has called in her prescription for Carafate. She has Pepcid which she will continue to take.  Referral to gastroenterology given for consideration of EGD.  The patient was urged to return to the Emergency Department immediately with worsening of current symptoms, worsening abdominal pain, persistent vomiting, blood noted in stools, fever, or any other concerns. The patient verbalized understanding.    MDM   Final diagnoses:  Epigastric pain   Patient with epigastric pain with features most consistent with peptic ulcer disease or gastritis. Ultrasound performed to rule out possibility of gallbladder disease. This was negative. Lab work  is otherwise unremarkable. Patient discharged with GI follow-up. Return instructions as above. Treatment as above.  I personally performed the services described in this documentation, which was scribed in my presence. The recorded information has been reviewed and is accurate.    Renne Crigler, PA-C 11/01/15 1704  Pricilla Loveless, MD 11/02/15 [phone]

## 2015-11-01 NOTE — ED Notes (Signed)
Pt states she has been experiencing epigastric pain since Oct when she was tx for pnx with levaquin and prednisone.  [facility] and was given 4 days of dexilant with  No relief.

## 2015-11-01 NOTE — Discharge Instructions (Signed)
Please read and follow all provided instructions.  Your diagnoses today include:  1. Epigastric pain     Tests performed today include:  Blood counts and electrolytes  Blood tests to check liver and kidney function  Blood tests to check pancreas function  Ultrasound - no gallbladder problems  Vital signs. See below for your results today.   Medications prescribed:   Protonix - stomach acid reducer  Take any prescribed medications only as directed.  Home care instructions:   Follow any educational materials contained in this packet.  Use of famotidine and Carafate as prescribed by your doctor.   Follow-up instructions: Please follow-up with the gastroenterologist in the next 7 days for further evaluation of your symptoms.    Return instructions:  SEEK IMMEDIATE MEDICAL ATTENTION IF:  The pain does not go away or becomes severe   A temperature above 101F develops   Repeated vomiting occurs (multiple episodes)   The pain becomes localized to portions of the abdomen. The right side could possibly be appendicitis. In an adult, the left lower portion of the abdomen could be colitis or diverticulitis.   Blood is being passed in stools or vomit (bright red or black tarry stools)   You develop chest pain, difficulty breathing, dizziness or fainting, or become confused, poorly responsive, or inconsolable (young children)  If you have any other emergent concerns regarding your health  Additional Information: Abdominal (belly) pain can be caused by many things. Your caregiver performed an examination and possibly ordered blood/urine tests and imaging (CT scan, x-rays, ultrasound). Many cases can be observed and treated at home after initial evaluation in the emergency department. Even though you are being discharged home, abdominal pain can be unpredictable. Therefore, you need a repeated exam if your pain does not resolve, returns, or worsens. Most patients with abdominal pain  don't have to be admitted to the hospital or have surgery, but serious problems like appendicitis and gallbladder attacks can start out as nonspecific pain. Many abdominal conditions cannot be diagnosed in one visit, so follow-up evaluations are very important.  Your vital signs today were: BP 134/96 mmHg   Pulse 65   Temp(Src) 97.9 F (36.6 C) (Oral)   Resp 22   Ht 5' 8.5" (1.74 m)   Wt 101.606 kg   BMI 33.56 kg/m2   SpO2 96% If your blood pressure (bp) was elevated above 135/85 this visit, please have this repeated by your doctor within one month. --------------

## 2015-11-13 ENCOUNTER — Other Ambulatory Visit: Payer: Self-pay | Admitting: Gastroenterology

## 2015-11-13 DIAGNOSIS — R1013 Epigastric pain: Secondary | ICD-10-CM

## 2015-11-21 ENCOUNTER — Ambulatory Visit (HOSPITAL_COMMUNITY)
Admission: RE | Admit: 2015-11-21 | Discharge: 2015-11-21 | Disposition: A | Discharge status: Home / Self Care | Payer: BLUE CROSS/BLUE SHIELD | Source: Ambulatory Visit | Attending: Gastroenterology | Admitting: Gastroenterology

## 2015-11-21 DIAGNOSIS — R1013 Epigastric pain: Secondary | ICD-10-CM | POA: Diagnosis not present

## 2015-11-21 MED ORDER — SINCALIDE 5 MCG IJ SOLR
0.0200 ug/kg | Freq: Once | INTRAMUSCULAR | Status: AC
  Administered 2015-11-21: 5 ug via INTRAVENOUS

## 2015-11-21 MED ORDER — SINCALIDE 5 MCG IJ SOLR
INTRAMUSCULAR | Status: AC
  Administered 2015-11-21: 5 ug via INTRAVENOUS
  Filled 2015-11-21: qty 5

## 2015-11-21 MED ORDER — TECHNETIUM TC 99M MEBROFENIN IV KIT
5.2000 | PACK | Freq: Once | INTRAVENOUS | Status: AC | PRN
  Administered 2015-11-21: 5 via INTRAVENOUS

## 2016-07-31 ENCOUNTER — Other Ambulatory Visit: Payer: Self-pay | Admitting: Orthopaedic Surgery

## 2016-07-31 DIAGNOSIS — M5416 Radiculopathy, lumbar region: Secondary | ICD-10-CM

## 2016-08-18 ENCOUNTER — Inpatient Hospital Stay: Admission: RE | Admit: 2016-08-18 | Payer: No Typology Code available for payment source | Source: Ambulatory Visit

## 2016-08-21 ENCOUNTER — Other Ambulatory Visit: Payer: No Typology Code available for payment source

## 2016-08-26 ENCOUNTER — Ambulatory Visit
Admission: RE | Admit: 2016-08-26 | Discharge: 2016-08-26 | Disposition: A | Discharge status: Home / Self Care | Payer: BLUE CROSS/BLUE SHIELD | Source: Ambulatory Visit | Attending: Orthopaedic Surgery | Admitting: Orthopaedic Surgery

## 2016-08-26 DIAGNOSIS — M5416 Radiculopathy, lumbar region: Secondary | ICD-10-CM

## 2016-09-22 IMAGING — DX DG ELBOW COMPLETE 3+V*R*
4 series · 4 of 4 positions shown · non-contrast
Comparison: None.

CLINICAL DATA: Fall today on out stetched right arm. Right arm pain
from distal humerus to wrist. Pt in a lot of pain, unable to bend
elbow to 90 degrees

EXAM:
RIGHT ELBOW - COMPLETE 3+ VIEW

[x elbow obl right (1 of 2)]
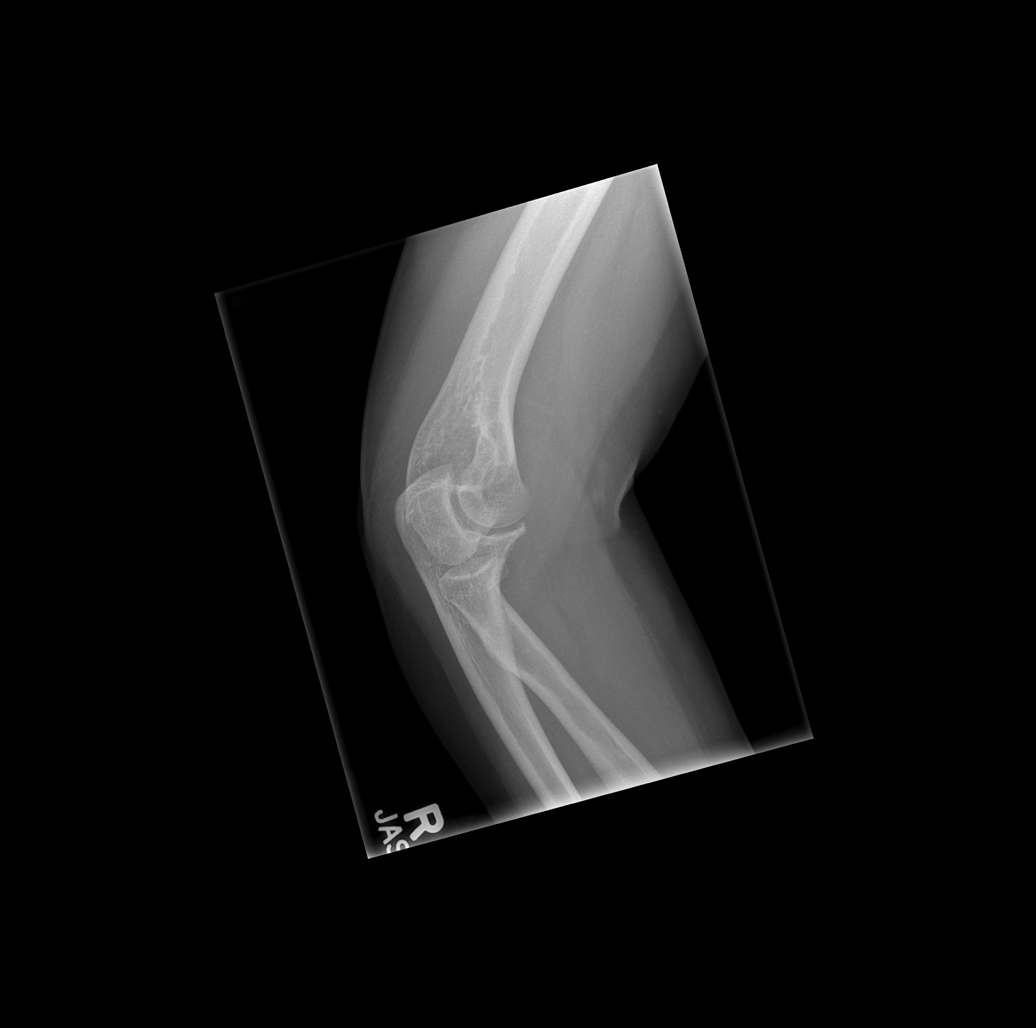

[x elbow ap right]
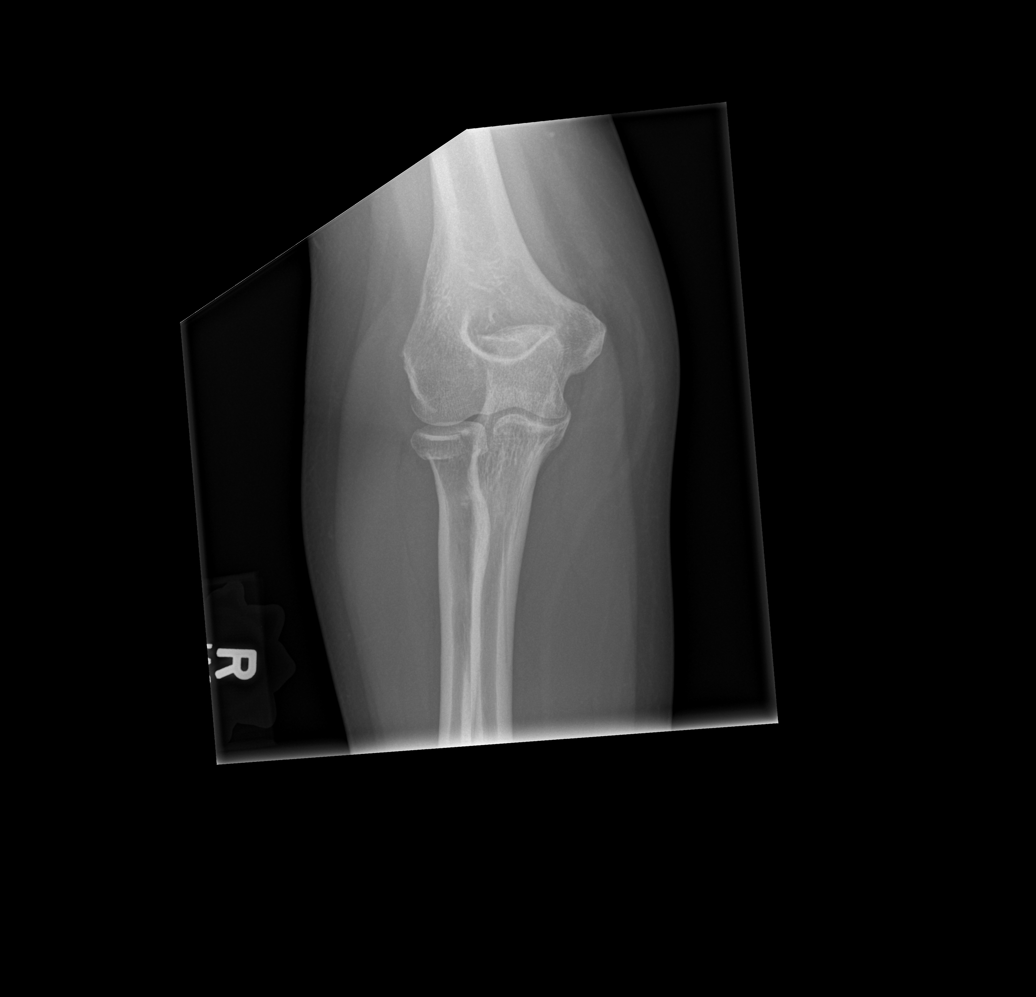

[x elbow obl right (2 of 2)]
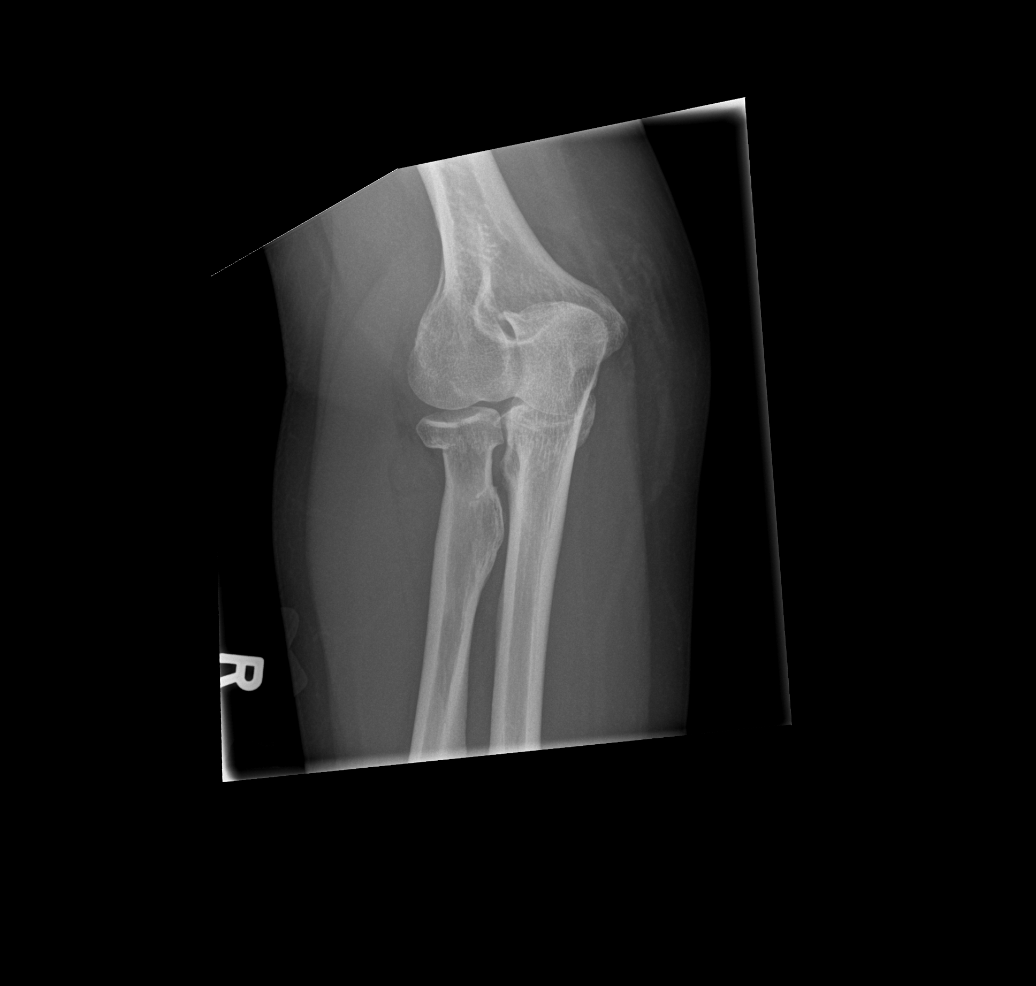

[x elbow lat right]
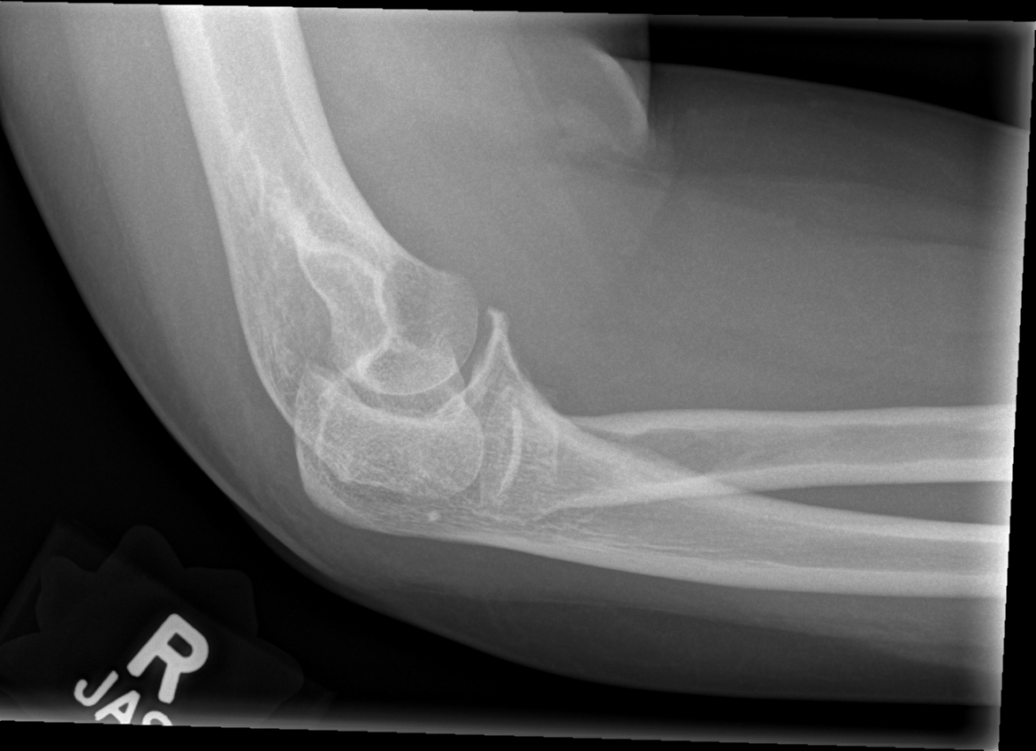

[4 of 4 positions shown; findings below may reference images not displayed]

FINDINGS: There is a fracture through the radial head, with a fracture line
extending across the articular surface in an oblique sagittal plane
and another fracture line across the radial neck. No significant
fracture comminution or displacement. No other fractures. Elbow
joint is normally aligned. There is a joint effusion.
IMPRESSION: Nondisplaced fracture of the radial head.

## 2016-09-22 IMAGING — DX DG FOREARM 2V*R*
3 series · 3 of 3 positions shown · non-contrast
Comparison: None.

CLINICAL DATA: Fall on outstretched right arm. Right forearm pain.
Initial encounter.

EXAM:
RIGHT FOREARM - 2 VIEW

[x forearm ap right (1 of 2)]
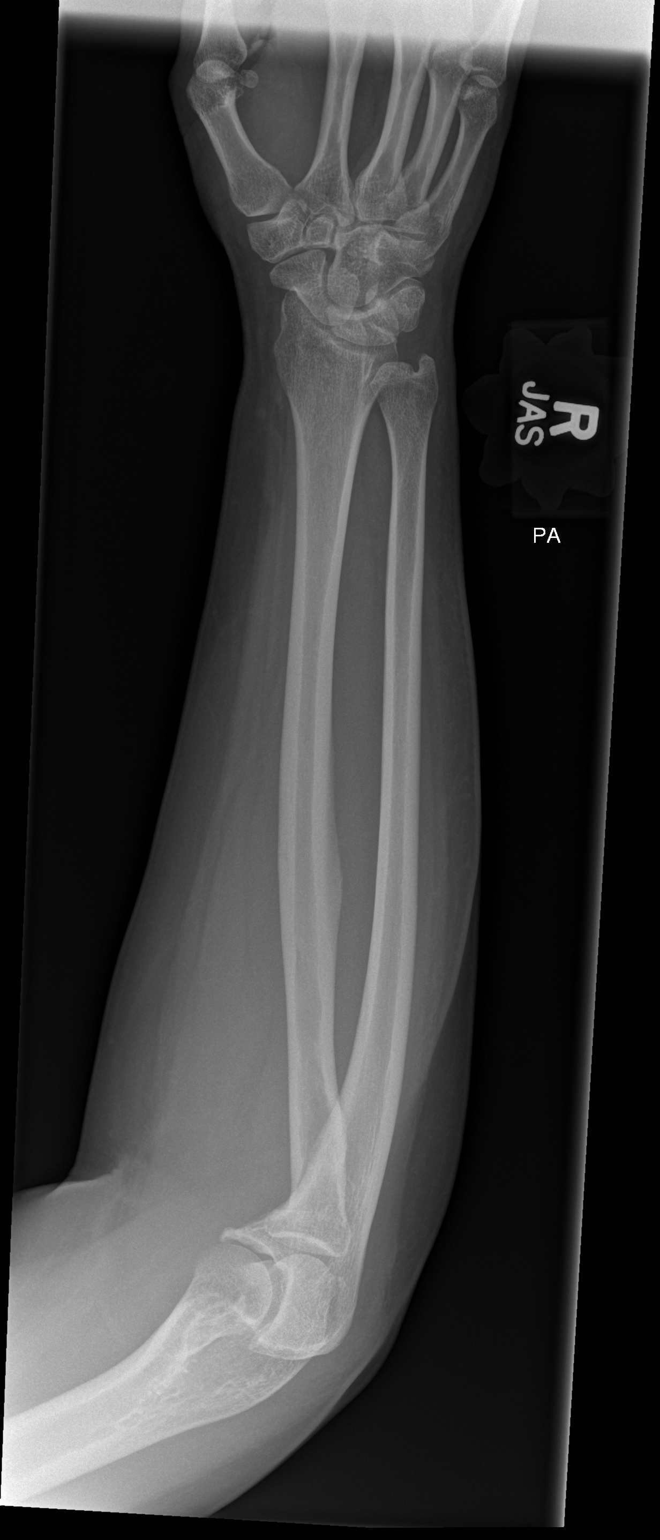

[x forearm ap right (2 of 2)]
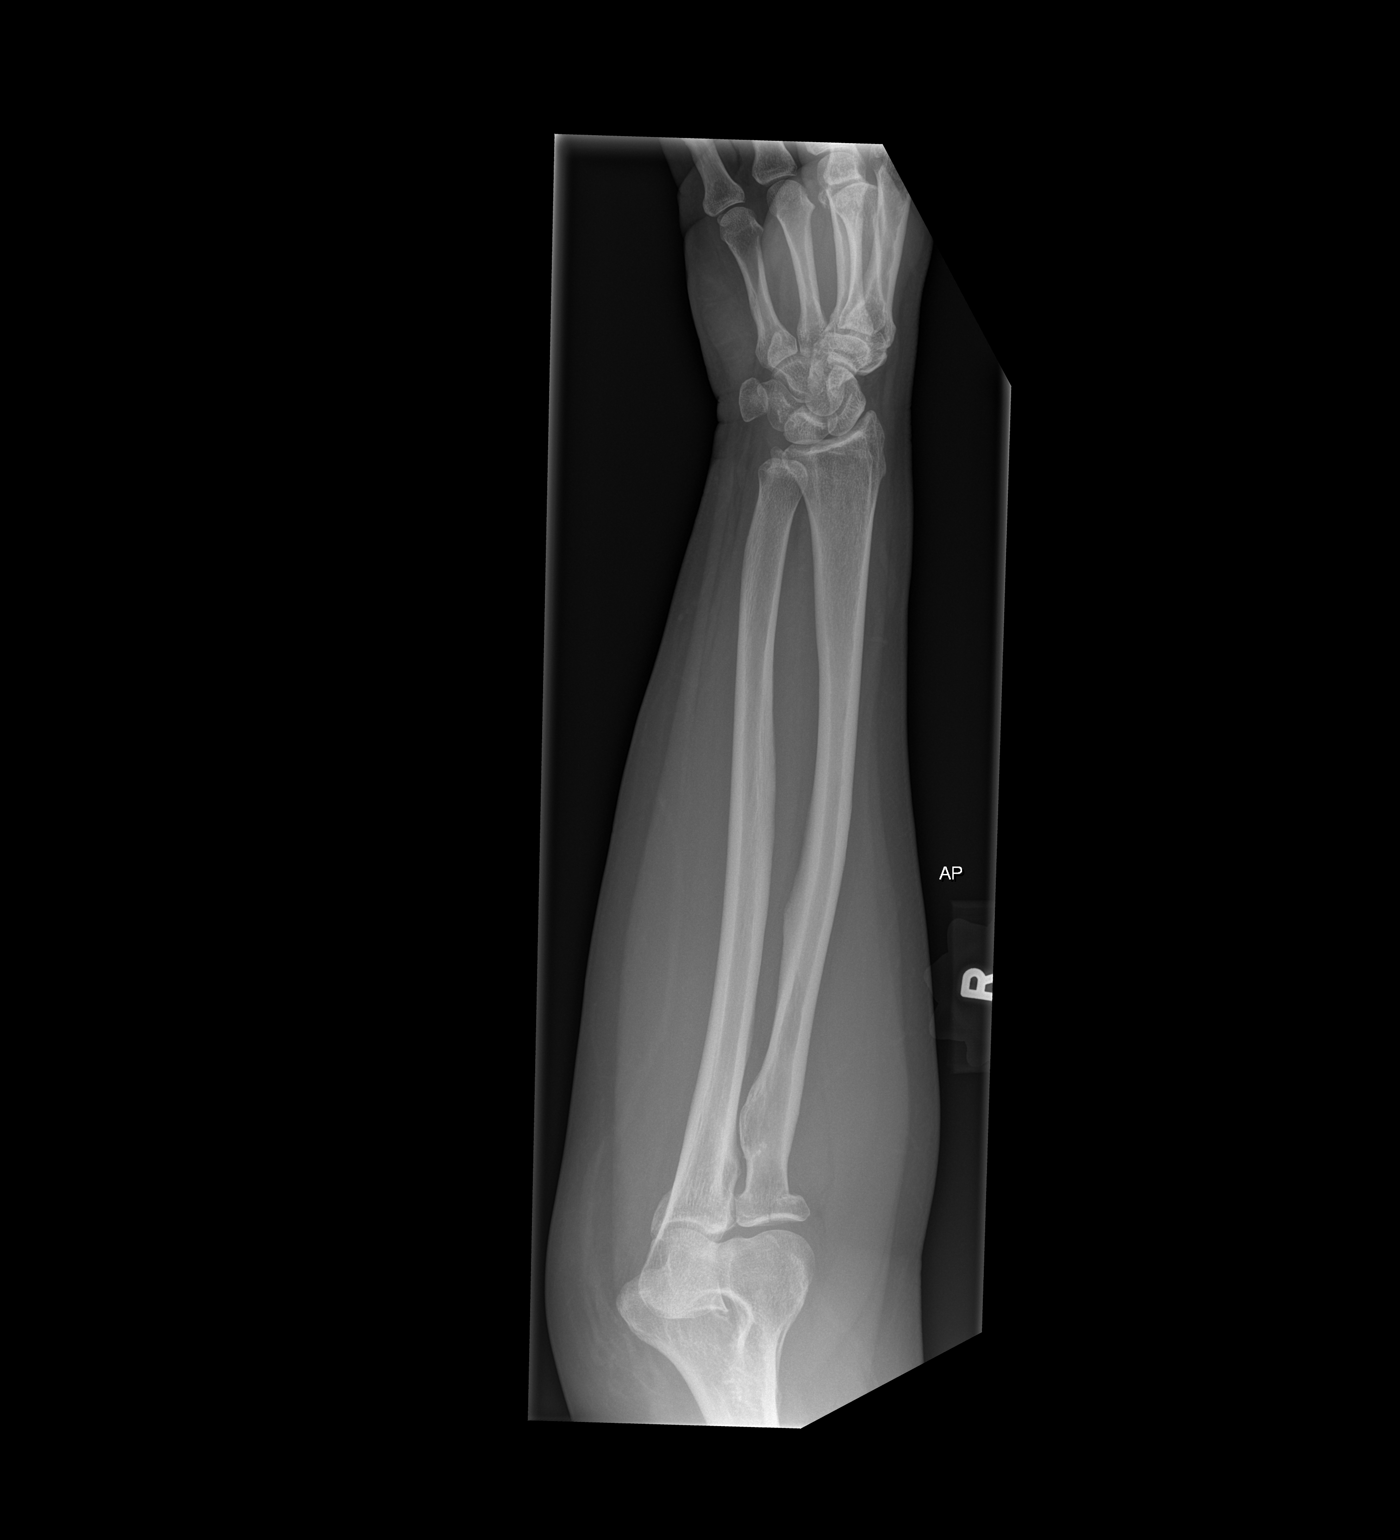

[x forearm lat right]
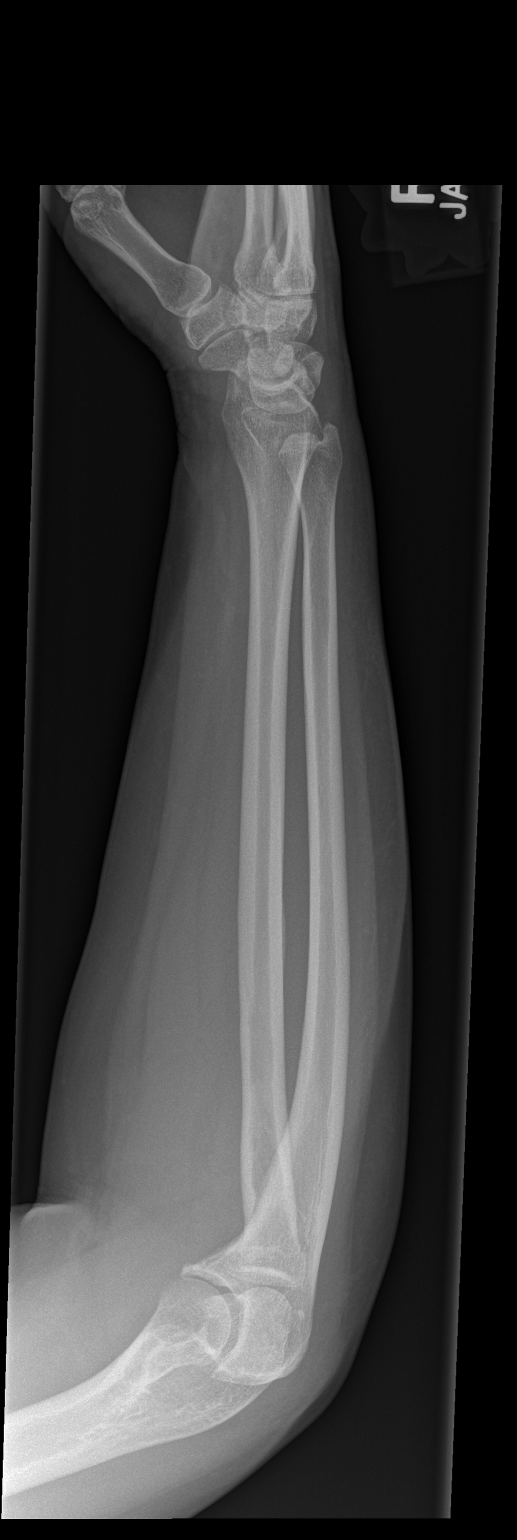

[3 of 3 positions shown; findings below may reference images not displayed]

FINDINGS: Minimally displaced radial head fracture is demonstrated. No other
fracture seen involving the radius or ulna. No evidence of
dislocation.
IMPRESSION: Minimally displaced radial head fracture.

## 2017-10-13 NOTE — Progress Notes (Signed)
[facility] - Clinic Note  10/14/2017     CHIEF COMPLAINT Patient presents for Retina Evaluation   HISTORY OF PRESENT ILLNESS: Sandra CockayneLaurie A Mcmiller is a 56 y.o. female who presents to the clinic today for:   HPI    Retina Evaluation    In right eye.  This started 1 week ago.  Associated Symptoms Blind Spot, Floaters, Photophobia and Glare.  Negative for Flashes, Scalp Tenderness, Fever, Pain, Jaw Claudication, Weight Loss, Distortion, Redness, Trauma, Shoulder/Hip pain and Fatigue.  Context:  distance vision, mid-range vision and near vision.  Treatments tried include no treatments.  I, the attending physician,  performed the HPI with the patient and updated documentation appropriately.          Comments    Pt presents today for retinal evaluation for VMT OD at the referral of Dr. Fabian SharpScott Groat, pt states she sees a gray ring in her central vision OD, pt also states she has had cataract sx OS and now vision in that eye is cloudy, pt denies flashes, pain or wavy vision, but does see floaters OS, pt states she had a spontaneous RD OS in 2006;       Last edited by Rennis ChrisZamora, Dynasia Kercheval, MD on 10/14/2017 10:58 AM. (History)    Pt states that she went to see Dr. Dione BoozeGroat to have a Yag Cap done; pt states that she was seen to "have vision confirmed"; Pt reports that she was given a "scan" and there was a "pulling on macula"; Pt reports she has many floaters OS, states that she experienced flashes of light OS at night;   Referring physician: Olivia CanterGroat, Richard Scott, MD [address] [place], [place] [id number]  HISTORICAL INFORMATION:   Selected notes from the MEDICAL RECORD NUMBER Referred by Dr. Laruth BouchardS. Groat for concern of VMT with macular hole OD;  LEE- 12.13.18 (S. Groat) [BCVA OD: 20/30+2 OS: 20/20] [facility] cataract OD, pseudophakia OS  PMH- migraine, PTSD    CURRENT MEDICATIONS: No current outpatient medications on file. (Ophthalmic  Drugs)   No current facility-administered medications for this visit.  (Ophthalmic Drugs)   Current Outpatient Medications (Other)  Medication Sig  . acetaminophen (TYLENOL) 500 MG tablet Take 1,000 mg by mouth every 6 (six) hours as needed for moderate pain.  . clobetasol cream (TEMOVATE) 0.05 % Apply to affected area two-three times a week  . diazepam (VALIUM) 5 MG tablet Take 2.5-5 mg by mouth as needed for anxiety.  . diphenhydrAMINE (BENADRYL) 25 MG tablet Take 25 mg by mouth every 6 (six) hours as needed for allergies.  . famotidine (PEPCID) 20 MG tablet Take 20 mg by mouth 2 (two) times daily.  . fluconazole (DIFLUCAN) 150 MG tablet Take 1 tablet (150 mg total) by mouth once. Can take additional dose three days later if symptoms persist  . lidocaine (XYLOCAINE) 2 % solution Use as directed 15 mLs in the mouth or throat every 6 (six) hours as needed.  Marland Kitchen. oxyCODONE-acetaminophen (PERCOCET/ROXICET) 5-325 MG per tablet Take 2 tablets by mouth every 4 (four) hours as needed for severe pain.  . pantoprazole (PROTONIX) 20 MG tablet Take 1 tablet (20 mg total) by mouth daily.  . propranolol (INDERAL) 20 MG tablet Take 20 mg by mouth daily.  . sertraline (ZOLOFT) 25 MG tablet Take 25-50 mg by mouth daily. Patient is working up to 50mg  Zoloft, took 25mg  today  . traZODone (DESYREL)  50 MG tablet Take 50 mg by mouth at bedtime as needed for sleep.   No current facility-administered medications for this visit.  (Other)      REVIEW OF SYSTEMS: ROS    Positive for: Eyes   Negative for: Constitutional, Gastrointestinal, Neurological, Skin, Genitourinary, Musculoskeletal, HENT, Endocrine, Cardiovascular, Respiratory, Psychiatric, Allergic/Imm, Heme/Lymph   Last edited by Posey Boyer, COT on 10/14/2017  9:14 AM. (History)       ALLERGIES Allergies  Allergen Reactions  . Serzone [Nefazodone] Other (See Comments)    Breathing difficulty    PAST MEDICAL HISTORY Past Medical History:   Diagnosis Date  . Anxiety   . Depression   . Headache    migraines  . PTSD (post-traumatic stress disorder)   . Thyroid nodule   . Vaginal infection    Past Surgical History:  Procedure Laterality Date  . HERNIA REPAIR    . TUBAL LIGATION      FAMILY HISTORY Family History  Problem Relation Age of Onset  . Cataracts Mother   . Cataracts Father   . Cataracts Maternal Grandfather   . Amblyopia Neg Hx   . Blindness Neg Hx   . Glaucoma Neg Hx   . Diabetes Neg Hx   . Macular degeneration Neg Hx   . Retinal detachment Neg Hx   . Strabismus Neg Hx   . Retinitis pigmentosa Neg Hx     SOCIAL HISTORY Social History   Tobacco Use  . Smoking status: Former Smoker    Packs/day: 0.25    Last attempt to quit: 10/20/2014    Years since quitting: 2.9  . Smokeless tobacco: Never Used  Substance Use Topics  . Alcohol use: Yes    Comment: occ  . Drug use: No         OPHTHALMIC EXAM:  Base Eye Exam    Visual Acuity (Snellen - Linear)      Right Left   Dist [place]  20/25 -2   Dist cc 20/50 +2    Dist ph [place]  20/20   Dist ph cc NI    Correction:  Glasses       Tonometry (Tonopen, 9:36 AM)      Right Left   Pressure 19 17       Pupils      Dark Light Shape React APD   Right 5 3 Round Brisk None   Left 5 3 Round Brisk None       Visual Fields (Counting fingers)      Left Right    Full Full       Extraocular Movement      Right Left    Full, Ortho Full, Ortho       Neuro/Psych    Oriented x3:  Yes   Mood/Affect:  Normal       Dilation    Both eyes:  1.0% Mydriacyl, 2.5% Phenylephrine @ 9:57 AM        Slit Lamp and Fundus Exam    Slit Lamp Exam      Right Left   Lids/Lashes Dermatochalasis - upper lid Dermatochalasis - upper lid   Conjunctiva/Sclera White and quiet White and quiet   Cornea 1+ Punctate epithelial erosions 1+ Punctate epithelial erosions   Anterior Chamber Deep and quiet Deep and quiet   Iris Round and dilated, punctate TID at 1000  Round and dilated, Transillumination defects from 0600 to 1100   Lens 1+ Posterior capsular opacification, 2+ Nuclear sclerosis PC  IOL in good postion with open PC   Vitreous syneresis Posterior vitreous detachment, vitreous debris       Fundus Exam      Right Left   Disc Normal Normal   C/D Ratio 0.1 0.25   Macula abnormal foveal reflex with tiny macular hole flat; mild ERM; no heme or edema   Vessels Mild Vascular attenuation Mild Vascular attenuation   Periphery Attached ragged Horseshoe tear spanning from 1130 to 0130 with good surrounding laser scars, round area of chorioretinal atrophy at 0700        Refraction    Wearing Rx      Sphere Cylinder Axis Add   Right -4.00 +3.00 098 +2.00   Left -5.50 +2.25 085 +2.00   Age:  66yrs   Type:  Bifocal       Manifest Refraction (Over)      Sphere Cylinder Axis Dist VA   Right -4.00 +3.00 087 20/40   Left -0.75 +1.00 085 20/25+2          IMAGING AND PROCEDURES  Imaging and Procedures for 10/15/17  OCT, Retina - OU - Both Eyes     Right Eye Quality was good. Central Foveal Thickness: 271. Progression has no prior data. Findings include abnormal foveal contour, epiretinal membrane, macular hole, vitreous traction, no IRF, no SRF (PVD).   Left Eye Quality was good. Central Foveal Thickness: 281. Progression has no prior data. Findings include normal foveal contour, no IRF, no SRF, epiretinal membrane.   Notes *Images captured and stored on drive  Diagnosis / Impression:  OD: small macular hole with residual VMT OS: mild ERM  Clinical management:  See below  Abbreviations: NFP - Normal foveal profile. CME - cystoid macular edema. PED - pigment epithelial detachment. IRF - intraretinal fluid. SRF - subretinal fluid. EZ - ellipsoid zone. ERM - epiretinal membrane. ORA - outer retinal atrophy. ORT - outer retinal tubulation. SRHM - subretinal hyper-reflective material                  ASSESSMENT/PLAN:     ICD-10-CM   1. Macular hole of right eye H35.341   2. Retinal edema H35.81 OCT, Retina - OU - Both Eyes  3. 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 Pseudophakia of left eye Z96.1   5. History of repair of retinal tear by laser photocoagulation Z98.890     1. Macular hole, OD-  - The natural history, anatomy, potential for loss of vision, and treatment options including vitrectomy techniques and the complications of endophthalmitis, retinal detachment, vitreous hemorrhage, cataract progression and permanent vision loss discussed with the patient. - very small full thickness macular hole with some residual VMT on nasal edge of hole - discussed findings and prognosis - recommend PPV w/ membrane peel following CE/IOL OD w/ Dr. Dione Booze - long discussion of PPV and aftercare, risk/benefits/alternatives - F/U 4-6 weeks   2. Minimal retinal edema associated with macular hole  3. Combined forms of age-related cataract OD - The symptoms of cataract, surgical options, and treatments and risks were discussed with patient. - discussed diagnosis and progression - visually significant - as above, recommend Dr. Dione Booze perform cataract surgery prior to macular hole repair  4. Pseudophakia OS  - s/p CE/IOL OS  - beautiful surgery, doing well  - monitor  5. History of RT with laser retinopexy OS - large, jagged retinal tear superiorly OS with good laser surrounding - however she does have  a lot of vitreous debris and floaters, which may be contributing to her complaints of haze and glare following cataract surgery - BCVA OS is 20/20    Ophthalmic Meds Ordered this visit:  No orders of the defined types were placed in this encounter.      Return in about 5 weeks (around 11/18/2017) for F/U Macular hole OD.  There are no Patient Instructions on file for this visit.   Explained the diagnoses, plan, and follow up with the patient and they expressed understanding.  Patient  expressed understanding of the importance of proper follow up care.   This document serves as a record of services personally performed by Karie ChimeraBrian G. Rahcel Shutes, MD, PhD. It was created on their behalf by Virgilio BellingMeredith Fabian, COA, a certified ophthalmic assistant. The creation of this record is the provider's dictation and/or activities during the visit.  Electronically signed by: Virgilio BellingMeredith Fabian, COA  10/15/17 9:24 AM    Karie ChimeraBrian G. Sanav Remer, M.D., Ph.D. Diseases & Surgery of the Retina and Vitreous [facility] 10/15/17   I have reviewed the above documentation for accuracy and completeness, and I agree with the above. Karie ChimeraBrian G. Miklo Aken, M.D., Ph.D. 10/15/17 9:24 AM     Abbreviations: M myopia (nearsighted); A astigmatism; H hyperopia (farsighted); P presbyopia; Mrx spectacle prescription;  CTL contact lenses; OD right eye; OS left eye; OU both eyes  XT exotropia; ET esotropia; PEK punctate epithelial keratitis; PEE punctate epithelial erosions; DES dry eye syndrome; MGD meibomian gland dysfunction; ATs artificial tears; PFAT's preservative free artificial tears; NSC nuclear sclerotic cataract; PSC posterior subcapsular cataract; ERM epi-retinal membrane; PVD posterior vitreous detachment; RD retinal detachment; DM diabetes mellitus; DR diabetic retinopathy; NPDR non-proliferative diabetic retinopathy; PDR proliferative diabetic retinopathy; CSME clinically significant macular edema; DME diabetic macular edema; dbh dot blot hemorrhages; CWS cotton wool spot; POAG primary open angle glaucoma; C/D cup-to-disc ratio; HVF humphrey visual field; GVF goldmann visual field; OCT optical coherence tomography; IOP intraocular pressure; BRVO Branch retinal vein occlusion; CRVO central retinal vein occlusion; CRAO central retinal artery occlusion; BRAO branch retinal artery occlusion; RT retinal tear; SB scleral buckle; PPV pars plana vitrectomy; VH Vitreous hemorrhage; PRP panretinal laser  photocoagulation; IVK intravitreal kenalog; VMT vitreomacular traction; MH Macular hole;  NVD neovascularization of the disc; NVE neovascularization elsewhere; AREDS age related eye disease study; ARMD age related macular degeneration; POAG primary open angle glaucoma; EBMD epithelial/anterior basement membrane dystrophy; ACIOL anterior chamber intraocular lens; IOL intraocular lens; PCIOL posterior chamber intraocular lens; Phaco/IOL phacoemulsification with intraocular lens placement; PRK photorefractive keratectomy; LASIK laser assisted in situ keratomileusis; HTN hypertension; DM diabetes mellitus; COPD chronic obstructive pulmonary disease

## 2017-10-14 ENCOUNTER — Ambulatory Visit (INDEPENDENT_AMBULATORY_CARE_PROVIDER_SITE_OTHER): Payer: 59 | Admitting: Ophthalmology

## 2017-10-14 ENCOUNTER — Encounter (INDEPENDENT_AMBULATORY_CARE_PROVIDER_SITE_OTHER): Payer: Self-pay | Admitting: Ophthalmology

## 2017-10-14 DIAGNOSIS — H25811 Combined forms of age-related cataract, right eye: Secondary | ICD-10-CM

## 2017-10-14 DIAGNOSIS — Z961 Presence of intraocular lens: Secondary | ICD-10-CM

## 2017-10-14 DIAGNOSIS — Z9889 Other specified postprocedural states: Secondary | ICD-10-CM

## 2017-10-14 DIAGNOSIS — H3581 Retinal edema: Secondary | ICD-10-CM

## 2017-10-14 DIAGNOSIS — H35341 Macular cyst, hole, or pseudohole, right eye: Secondary | ICD-10-CM

## 2017-10-15 ENCOUNTER — Encounter (INDEPENDENT_AMBULATORY_CARE_PROVIDER_SITE_OTHER): Payer: Self-pay | Admitting: Ophthalmology

## 2017-11-17 NOTE — Progress Notes (Deleted)
[facility] - Clinic Note  11/18/2017     CHIEF COMPLAINT Patient presents for No chief complaint on file.   HISTORY OF PRESENT ILLNESS: Sandra Herring is a 57 y.o. female who presents to the clinic today for:   Pt states that she went to see Dr. Dione Booze to have a Yag Cap done; pt states that she was seen to "have vision confirmed"; Pt reports that she was given a "scan" and there was a "pulling on macula"; Pt reports she has many floaters OS, states that she experienced flashes of light OS at night;   Referring physician: Lindaann Pascal, PA-C [address] [place], [place] [address]  HISTORICAL INFORMATION:   Selected notes from the MEDICAL RECORD NUMBER Referred by Dr. Laruth Bouchard for concern of VMT with macular hole OD;  LEE- 12.13.18 (S. Groat) [BCVA OD: 20/30+2 OS: 20/20] Ocular Hx- S/P focal laser OS (2007 - [facility]), cataract OD, pseudophakia OS  PMH- migraine, PTSD    CURRENT MEDICATIONS: No current outpatient medications on file. (Ophthalmic Drugs)   No current facility-administered medications for this visit.  (Ophthalmic Drugs)   Current Outpatient Medications (Other)  Medication Sig   acetaminophen (TYLENOL) 500 MG tablet Take 1,000 mg by mouth every 6 (six) hours as needed for moderate pain.   clobetasol cream (TEMOVATE) 0.05 % Apply to affected area two-three times a week   diazepam (VALIUM) 5 MG tablet Take 2.5-5 mg by mouth as needed for anxiety.   diphenhydrAMINE (BENADRYL) 25 MG tablet Take 25 mg by mouth every 6 (six) hours as needed for allergies.   famotidine (PEPCID) 20 MG tablet Take 20 mg by mouth 2 (two) times daily.   fluconazole (DIFLUCAN) 150 MG tablet Take 1 tablet (150 mg total) by mouth once. Can take additional dose three days later if symptoms persist   lidocaine (XYLOCAINE) 2 % solution Use as directed 15 mLs in the mouth or throat every 6 (six) hours as needed.   oxyCODONE-acetaminophen (PERCOCET/ROXICET)  5-325 MG per tablet Take 2 tablets by mouth every 4 (four) hours as needed for severe pain.   pantoprazole (PROTONIX) 20 MG tablet Take 1 tablet (20 mg total) by mouth daily.   propranolol (INDERAL) 20 MG tablet Take 20 mg by mouth daily.   sertraline (ZOLOFT) 25 MG tablet Take 25-50 mg by mouth daily. Patient is working up to 50mg  Zoloft, took 25mg  today   traZODone (DESYREL) 50 MG tablet Take 50 mg by mouth at bedtime as needed for sleep.   No current facility-administered medications for this visit.  (Other)      REVIEW OF SYSTEMS:    ALLERGIES Allergies  Allergen Reactions   Serzone [Nefazodone] Other (See Comments)    Breathing difficulty    PAST MEDICAL HISTORY Past Medical History:  Diagnosis Date   Anxiety    Depression    Headache    migraines   PTSD (post-traumatic stress disorder)    Thyroid nodule    Vaginal infection    Past Surgical History:  Procedure Laterality Date   HERNIA REPAIR     TUBAL LIGATION      FAMILY HISTORY Family History  Problem Relation Age of Onset   Cataracts Mother    Cataracts Father    Cataracts Maternal Grandfather    Amblyopia Neg Hx    Blindness Neg Hx    Glaucoma Neg Hx    Diabetes Neg Hx    Macular degeneration Neg Hx  Retinal detachment Neg Hx    Strabismus Neg Hx    Retinitis pigmentosa Neg Hx     SOCIAL HISTORY Social History   Tobacco Use   Smoking status: Former Smoker    Packs/day: 0.25    Last attempt to quit: 10/20/2014    Years since quitting: 3.0   Smokeless tobacco: Never Used  Substance Use Topics   Alcohol use: Yes    Comment: occ   Drug use: No         OPHTHALMIC EXAM:   Not recorded      IMAGING AND PROCEDURES  Imaging and Procedures for 11/17/17           ASSESSMENT/PLAN:    ICD-10-CM   1. Macular hole of right eye H35.341   2. Retinal edema H35.81 OCT, Retina - OU - Both Eyes  3. Combined forms of age-related cataract of right eye  H25.811   4. Pseudophakia of left eye Z96.1   5. History of repair of retinal tear by laser photocoagulation Z98.890     1. Macular hole, OD-  - The natural history, anatomy, potential for loss of vision, and treatment options including vitrectomy techniques and the complications of endophthalmitis, retinal detachment, vitreous hemorrhage, cataract progression and permanent vision loss discussed with the patient. - very small full thickness macular hole with some residual VMT on nasal edge of hole - discussed findings and prognosis - recommend PPV w/ membrane peel following CE/IOL OD w/ Dr. Dione BoozeGroat\ - long discussion of PPV and aftercare, risk/benefits/alternatives - F/U 4-6 weeks   2. Minimal retinal edema associated with macular hole  3. Combined forms of age-related cataract OD - The symptoms of cataract, surgical options, and treatments and risks were discussed with patient. - discussed diagnosis and progression - visually significant - as above, recommend Dr. Dione BoozeGroat perform cataract surgery prior to macular hole repair  4. Pseudophakia OS  - s/p CE/IOL OS  - beautiful surgery, doing well  - monitor  5. History of RT with laser retinopexy OS - large, jagged retinal tear superiorly OS with good laser surrounding - however she does have a lot of vitreous debris and floaters, which may be contributing to her complaints of haze and glare following cataract surgery - BCVA OS is 20/20    Ophthalmic Meds Ordered this visit:  No orders of the defined types were placed in this encounter.      No Follow-up on file.  There are no Patient Instructions on file for this visit.   Explained the diagnoses, plan, and follow up with the patient and they expressed understanding.  Patient expressed understanding of the importance of proper follow up care.   This document serves as a record of services personally performed by Karie ChimeraBrian G. Zamora, MD, PhD. It was created on their behalf by  Virgilio BellingMeredith Fabian, COA, a certified ophthalmic assistant. The creation of this record is the provider's dictation and/or activities during the visit.  Electronically signed by: Virgilio BellingMeredith Fabian, COA  11/17/17 8:39 AM    Karie ChimeraBrian G. Zamora, M.D., Ph.D. Diseases & Surgery of the Retina and Vitreous [facility] 11/17/17     Abbreviations: M myopia (nearsighted); A astigmatism; H hyperopia (farsighted); P presbyopia; Mrx spectacle prescription;  CTL contact lenses; OD right eye; OS left eye; OU both eyes  XT exotropia; ET esotropia; PEK punctate epithelial keratitis; PEE punctate epithelial erosions; DES dry eye syndrome; MGD meibomian gland dysfunction; ATs artificial tears; PFAT's preservative free artificial tears;  NSC nuclear sclerotic cataract; PSC posterior subcapsular cataract; ERM epi-retinal membrane; PVD posterior vitreous detachment; RD retinal detachment; DM diabetes mellitus; DR diabetic retinopathy; NPDR non-proliferative diabetic retinopathy; PDR proliferative diabetic retinopathy; CSME clinically significant macular edema; DME diabetic macular edema; dbh dot blot hemorrhages; CWS cotton wool spot; POAG primary open angle glaucoma; C/D cup-to-disc ratio; HVF humphrey visual field; GVF goldmann visual field; OCT optical coherence tomography; IOP intraocular pressure; BRVO Branch retinal vein occlusion; CRVO central retinal vein occlusion; CRAO central retinal artery occlusion; BRAO branch retinal artery occlusion; RT retinal tear; SB scleral buckle; PPV pars plana vitrectomy; VH Vitreous hemorrhage; PRP panretinal laser photocoagulation; IVK intravitreal kenalog; VMT vitreomacular traction; MH Macular hole;  NVD neovascularization of the disc; NVE neovascularization elsewhere; AREDS age related eye disease study; ARMD age related macular degeneration; POAG primary open angle glaucoma; EBMD epithelial/anterior basement membrane dystrophy; ACIOL anterior chamber intraocular  lens; IOL intraocular lens; PCIOL posterior chamber intraocular lens; Phaco/IOL phacoemulsification with intraocular lens placement; PRK photorefractive keratectomy; LASIK laser assisted in situ keratomileusis; HTN hypertension; DM diabetes mellitus; COPD chronic obstructive pulmonary disease

## 2017-11-18 ENCOUNTER — Encounter (INDEPENDENT_AMBULATORY_CARE_PROVIDER_SITE_OTHER): Payer: 59 | Admitting: Ophthalmology

## 2017-12-08 ENCOUNTER — Encounter (INDEPENDENT_AMBULATORY_CARE_PROVIDER_SITE_OTHER): Payer: Self-pay | Admitting: Ophthalmology

## 2017-12-08 ENCOUNTER — Ambulatory Visit (INDEPENDENT_AMBULATORY_CARE_PROVIDER_SITE_OTHER): Payer: 59 | Admitting: Ophthalmology

## 2017-12-08 DIAGNOSIS — H33331 Multiple defects of retina without detachment, right eye: Secondary | ICD-10-CM | POA: Diagnosis not present

## 2017-12-08 DIAGNOSIS — Z961 Presence of intraocular lens: Secondary | ICD-10-CM

## 2017-12-08 DIAGNOSIS — H35341 Macular cyst, hole, or pseudohole, right eye: Secondary | ICD-10-CM | POA: Diagnosis not present

## 2017-12-08 DIAGNOSIS — H3581 Retinal edema: Secondary | ICD-10-CM | POA: Diagnosis not present

## 2017-12-08 DIAGNOSIS — Z9889 Other specified postprocedural states: Secondary | ICD-10-CM

## 2017-12-08 MED ORDER — PREDNISOLONE ACETATE 1 % OP SUSP: 1.0000 [drp] | Freq: Four times a day (QID) | OPHTHALMIC | 0 refills | Status: DC

## 2017-12-08 NOTE — H&P (Signed)
Sandra CockayneLaurie A Herring is an 57 y.o. female.    Chief Complaint: Decreased vision, right  HPI: Pt with known history of macular hole right eye, presents with 3 day history of acute decrease in vision. Noted to have acute PVD with progression of macular hole and 3 new retinal tears OD.  Past Medical History:  Diagnosis Date  . Anxiety   . Depression   . Headache    migraines  . PTSD (post-traumatic stress disorder)   . Thyroid nodule   . Vaginal infection     Past Surgical History:  Procedure Laterality Date  . HERNIA REPAIR    . TUBAL LIGATION      Family History  Problem Relation Age of Onset  . Cataracts Mother   . Cataracts Father   . Cataracts Maternal Grandfather   . Amblyopia Neg Hx   . Blindness Neg Hx   . Glaucoma Neg Hx   . Diabetes Neg Hx   . Macular degeneration Neg Hx   . Retinal detachment Neg Hx   . Strabismus Neg Hx   . Retinitis pigmentosa Neg Hx    Social History:  reports that she quit smoking about 3 years ago. She smoked 0.25 packs per day. she has never used smokeless tobacco. She reports that she drinks alcohol. She reports that she does not use drugs.  Allergies:  Allergies  Allergen Reactions  . Nsaids Other (See Comments)    gastritis  . Serzone [Nefazodone] Other (See Comments)    Breathing difficulty    No medications prior to admission.    Review of systems otherwise negative  There were no vitals taken for this visit.  Physical exam: Mental status: oriented x3. Eyes: See eye exam associated with this date of surgery Ears, Nose, Throat: within normal limits Neck: Within Normal limits General: within normal limits Chest: Within normal limits Breast: deferred Heart: Within normal limits Abdomen: Within normal limits GU: deferred Extremities: within normal limits Skin: within normal limits  Assessment/Plan 1. Full thickness macular hole OD 2. Retinal tears OD  [facility] 25g pars [facility]a vitrectomy w/ ICG  stain, membrane peel, endolaser, and gas, RIGHT EYE under general anesthesia Case scheduled for Thursday, 1245 pm.  Karie ChimeraBrian G. Kingsley Farace, M.D., Ph.D. Vitreoretinal Surgeon [facility]

## 2017-12-08 NOTE — Progress Notes (Signed)
[facility] - Clinic Note  12/08/2017     CHIEF COMPLAINT Patient presents for Retina Evaluation   HISTORY OF PRESENT ILLNESS: Sandra Herring is a 57 y.o. female who presents to the clinic today for:   HPI    Retina Evaluation    In right eye.  This started 3 days ago.  Associated Symptoms Flashes and Floaters.  Negative for Blind Spot, Photophobia, Scalp Tenderness, Fever, Pain, Glare, Jaw Claudication, Weight Loss, Fatigue, Shoulder/Hip pain, Trauma, Redness and Distortion.  Context:  distance vision, mid-range vision and near vision.  Treatments tried include no treatments.  I, the attending physician,  performed the HPI with the patient and updated documentation appropriately.          Comments    Pt present today for referral from Dr. Laruth Bouchard, pt saw him this morning, pt states she had serious agitation for 3 days, it went away at 7:00 Sunday morning and now pt has black dots in her vision OD, states it is like when she had a spontaneous RD in OS, pt states she had bluish flashes in a circular pattern, but only in the dark,        Last edited by Rennis Chris, MD on 12/08/2017  9:13 AM. (History)    Pt states she saw Dr. Laruth Bouchard this morning and states she is having trouble seeing; Pt states she feels she is "looking through mesh"; Pt states she had cataract sx in December; Pt last ate or drank at 6:30 this morning;   Referring physician: Olivia Canter, MD [address] [place], [place] [address]  HISTORICAL INFORMATION:   Selected notes from the MEDICAL RECORD NUMBER Referred by Dr. Laruth Bouchard for concern of VMT with macular hole OD;  LEE- 12.13.18 (S. Groat) [BCVA OD: 20/30+2 OS: 20/20] Ocular Hx- S/P focal laser OS (2007 - [place]), pseudophakia OU PMH- migraine, PTSD    CURRENT MEDICATIONS: Current Outpatient Medications (Ophthalmic Drugs)  Medication Sig  . prednisoLONE acetate (PRED FORTE) 1 % ophthalmic suspension Place 1 drop  into the right eye 4 (four) times daily.   No current facility-administered medications for this visit.  (Ophthalmic Drugs)   Current Outpatient Medications (Other)  Medication Sig  . acetaminophen (TYLENOL) 500 MG tablet Take 1,000 mg by mouth daily as needed for moderate pain.   . Ascorbic Acid (VITAMIN C) 1000 MG tablet Take 4,000 mg by mouth daily.  . cyclobenzaprine (FLEXERIL) 10 MG tablet Take 10 mg by mouth at bedtime.  . diazepam (VALIUM) 5 MG tablet Take 5 mg by mouth at bedtime.   . famotidine (PEPCID) 20 MG tablet Take 20 mg by mouth daily as needed for heartburn.   Marland Kitchen OVER THE COUNTER MEDICATION Take 1,500 mg by mouth daily. Cordyceps otc supplement  . OVER THE COUNTER MEDICATION Take 3 capsules by mouth daily. Bone up otc supplement  . pantoprazole (PROTONIX) 20 MG tablet Take 1 tablet (20 mg total) by mouth daily. (Patient taking differently: Take 20 mg by mouth daily as needed for heartburn or indigestion. )  . propranolol (INDERAL) 20 MG tablet Take 20 mg by mouth at bedtime.   . sertraline (ZOLOFT) 100 MG tablet Take 150 mg by mouth daily.   . sodium chloride (OCEAN) 0.65 % SOLN nasal spray Place 1 spray into both nostrils as needed for congestion.  . traMADol (ULTRAM) 50 MG tablet Take 50 mg by mouth daily as needed for moderate  pain.   . traZODone (DESYREL) 100 MG tablet Take 50 mg by mouth at bedtime.  . Vitamin D-Vitamin K (VITAMIN K2-VITAMIN D3 PO) Take 2 tablets by mouth daily.   No current facility-administered medications for this visit.  (Other)      REVIEW OF SYSTEMS: ROS    Positive for: Endocrine, Eyes, Psychiatric   Negative for: Constitutional, Gastrointestinal, Neurological, Skin, Genitourinary, Musculoskeletal, HENT, Cardiovascular, Respiratory, Allergic/Imm, Heme/Lymph   Last edited by Posey BoyerBrown, Amanda J, COT on 12/08/2017  9:02 AM. (History)       ALLERGIES Allergies  Allergen Reactions  . Nsaids Other (See Comments)    gastritis  . Serzone  [Nefazodone] Other (See Comments)    Breathing difficulty    PAST MEDICAL HISTORY Past Medical History:  Diagnosis Date  . Anxiety   . Depression   . Headache    migraines  . PTSD (post-traumatic stress disorder)   . Thyroid nodule   . Vaginal infection    Past Surgical History:  Procedure Laterality Date  . HERNIA REPAIR    . TUBAL LIGATION      FAMILY HISTORY Family History  Problem Relation Age of Onset  . Cataracts Mother   . Cataracts Father   . Cataracts Maternal Grandfather   . Amblyopia Neg Hx   . Blindness Neg Hx   . Glaucoma Neg Hx   . Diabetes Neg Hx   . Macular degeneration Neg Hx   . Retinal detachment Neg Hx   . Strabismus Neg Hx   . Retinitis pigmentosa Neg Hx     SOCIAL HISTORY Social History   Tobacco Use  . Smoking status: Former Smoker    Packs/day: 0.25    Last attempt to quit: 10/20/2014    Years since quitting: 3.1  . Smokeless tobacco: Never Used  Substance Use Topics  . Alcohol use: Yes    Comment: occ  . Drug use: No         OPHTHALMIC EXAM:  Base Eye Exam    Visual Acuity (Snellen - Linear)      Right Left   Dist [place] 20/200 20/25   Dist ph [place] NI 20/20 -2       Tonometry (Tonopen, 9:09 AM)      Right Left   Pressure 20 19       Pupils      Dark Light Shape React APD   Right 7 7 Round NR None   Left 5 4 Round Brisk None  Pt arrived pharm dilated       Neuro/Psych    Oriented x3:  Yes   Mood/Affect:  Normal       Dilation    Right eye:  1.0% Mydriacyl, 2.5% Phenylephrine @ 9:12 AM        Slit Lamp and Fundus Exam    Slit Lamp Exam      Right Left   Lids/Lashes Dermatochalasis - upper lid Dermatochalasis - upper lid   Conjunctiva/Sclera White and quiet White and quiet   Cornea 1+ Punctate epithelial erosions 1+ Punctate epithelial erosions   Anterior Chamber Deep and quiet Deep and quiet   Iris Round and dilated, punctate TID at 1000 Round and dilated, Transillumination defects from 0600 to 1100   Lens  toric PC IOL with marks at 630 and 1230 PC IOL in good postion with open PC   Vitreous syneresis, Posterior vitreous detachment with vitreous debris and trace heme Posterior vitreous detachment, vitreous debris  Fundus Exam      Right Left   Disc Normal Normal   C/D Ratio 0.1 0.25   Macula abnormal foveal reflex, full thickness Macular hole flat; mild ERM; no heme or edema   Vessels Mild Vascular attenuation Mild Vascular attenuation   Periphery Attached, three ragged tears in a row spanning 0600 to 0730 ragged Horseshoe tear spanning from 1130 to 0130 with good surrounding laser scars, round area of chorioretinal atrophy at 0700          IMAGING AND PROCEDURES  Imaging and Procedures for 12/08/17  OCT, Retina - OU - Both Eyes     Right Eye Quality was good. Central Foveal Thickness: 501. Progression has worsened. Findings include abnormal foveal contour, epiretinal membrane, macular hole, no SRF, intraretinal fluid.   Left Eye Quality was good. Central Foveal Thickness: 277. Progression has been stable. Findings include normal foveal contour, no IRF, no SRF, epiretinal membrane.   Notes *Images captured and stored on drive  Diagnosis / Impression:  OD: FTMH w/ surrounding CME OS: mild ERM  Clinical management:  See below  Abbreviations: NFP - Normal foveal profile. CME - cystoid macular edema. PED - pigment epithelial detachment. IRF - intraretinal fluid. SRF - subretinal fluid. EZ - ellipsoid zone. ERM - epiretinal membrane. ORA - outer retinal atrophy. ORT - outer retinal tubulation. SRHM - subretinal hyper-reflective material         Repair Retinal Breaks, Laser - OD - Right Eye     LASER PROCEDURE NOTE  Procedure:  Barrier laser retinopexy using slit lamp laser, RIGHT eye   Diagnosis:   3 horseshoe retinal tears, RIGHT eye                     3 horseshoe retinal tears in a row from 6-730 oclock, anterior to equator   Surgeon: Rennis Chris, MD,  PhD  Anesthesia: Topical  Informed consent obtained, operative eye marked, and time out performed prior to initiation of laser.   Laser settings:  Lumenis Smart532 laser, slit lamp Lens: [place]ster PRP 165 Power: 300 mW Spot size: 200 microns Duration: 70 msec  # spots: 434  Placement of laser: Using a Mainster PRP 165 contact lens at the slit lamp, laser was placed in three confluent rows around horseshoe tears from 6-730 oclock anterior to equator with additional rows anteriorly.  Complications: None.  Patient tolerated the procedure well and received written and verbal post-procedure care information/education.                ASSESSMENT/PLAN:    ICD-10-CM   1. Macular hole of right eye H35.341   2. 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 Retinal edema H35.81 OCT, Retina - OU - Both Eyes  4. Pseudophakia of both eyes Z96.1   5. History of repair of retinal tear by laser photocoagulation Z98.890     1. Macular hole, OD-  - The natural history, anatomy, potential for loss of vision, and treatment options including vitrectomy techniques and the complications of endophthalmitis, retinal detachment, vitreous hemorrhage, cataract progression and permanent vision loss discussed with the patient. - enlarged full thickness macular hole with +CME - discussed findings and prognosis - recommend PPV w/ membrane peel and gas under general anesthesia OD - pt wishes to proceed with surgical intervention - RBA of procedure discussed, questions answered - informed consent obtained and signed - surgery scheduled for Thursday (  02.14.19) at 12:45 PM, [facility] - Orders placed - H&P completed - f/u POD1  2. Retinal tears OD-  - The incidence, risk factors, and natural history of retinal tear was discussed with patient.   - Potential treatment options including laser retinopexy and cryotherapy discussed with patient. -  three tears located spanning from 0600 to 0730  - recommend laser retinopexy today (02.12.19) - pt wishes to proceed - RBA of procedure discussed, questions answered - informed consent obtained and signed - see procedure note - start PF QID x7 days - f/u in 1-2 wks  3. Retinal edema-  - +CME surrounding macular hole  4. Pseudophakia OU  - s/p CE/IOL OU  - OD by Dr. Kathie Rhodes. Groat (09/2017) toric lens in good postion  - beautiful surgeries, doing well  - monitor  5. History of RT with laser retinopexy OS - large, jagged retinal tear superiorly OS with good laser surrounding - however she does have a lot of vitreous debris and floaters, which may be contributing to her complaints of haze and glare following cataract surgery - BCVA OS is 20/20    Ophthalmic Meds Ordered this visit:  Meds ordered this encounter  Medications  . prednisoLONE acetate (PRED FORTE) 1 % ophthalmic suspension    Sig: Place 1 drop into the right eye 4 (four) times daily.    Dispense:  10 mL    Refill:  0       Return in about 3 days (around 12/11/2017) for POV.  There are no Patient Instructions on file for this visit.   Explained the diagnoses, plan, and follow up with the patient and they expressed understanding.  Patient expressed understanding of the importance of proper follow up care.   This document serves as a record of services personally performed by Karie Chimera, MD, PhD. It was created on their behalf by Virgilio Belling, COA, a certified ophthalmic assistant. The creation of this record is the provider's dictation and/or activities during the visit.  Electronically signed by: Virgilio Belling, COA  12/08/17 12:09 PM    Karie Chimera, M.D., Ph.D. Diseases & Surgery of the Retina and Vitreous [facility] 12/08/17   I have reviewed the above documentation for accuracy and completeness, and I agree with the above. Karie Chimera, M.D., Ph.D. 12/08/17 12:09  PM     Abbreviations: M myopia (nearsighted); A astigmatism; H hyperopia (farsighted); P presbyopia; Mrx spectacle prescription;  CTL contact lenses; OD right eye; OS left eye; OU both eyes  XT exotropia; ET esotropia; PEK punctate epithelial keratitis; PEE punctate epithelial erosions; DES dry eye syndrome; MGD meibomian gland dysfunction; ATs artificial tears; PFAT's preservative free artificial tears; NSC nuclear sclerotic cataract; PSC posterior subcapsular cataract; ERM epi-retinal membrane; PVD posterior vitreous detachment; RD retinal detachment; DM diabetes mellitus; DR diabetic retinopathy; NPDR non-proliferative diabetic retinopathy; PDR proliferative diabetic retinopathy; CSME clinically significant macular edema; DME diabetic macular edema; dbh dot blot hemorrhages; CWS cotton wool spot; POAG primary open angle glaucoma; C/D cup-to-disc ratio; HVF humphrey visual field; GVF goldmann visual field; OCT optical coherence tomography; IOP intraocular pressure; BRVO Branch retinal vein occlusion; CRVO central retinal vein occlusion; CRAO central retinal artery occlusion; BRAO branch retinal artery occlusion; RT retinal tear; SB scleral buckle; PPV pars plana vitrectomy; VH Vitreous hemorrhage; PRP panretinal laser photocoagulation; IVK intravitreal kenalog; VMT vitreomacular traction; MH Macular hole;  NVD neovascularization of the disc; NVE neovascularization elsewhere; AREDS age related eye disease study; ARMD  age related macular degeneration; POAG primary open angle glaucoma; EBMD epithelial/anterior basement membrane dystrophy; ACIOL anterior chamber intraocular lens; IOL intraocular lens; PCIOL posterior chamber intraocular lens; Phaco/IOL phacoemulsification with intraocular lens placement; [place] photorefractive keratectomy; LASIK laser assisted in situ keratomileusis; HTN hypertension; DM diabetes mellitus; COPD chronic obstructive pulmonary disease

## 2017-12-09 ENCOUNTER — Other Ambulatory Visit: Payer: Self-pay

## 2017-12-09 ENCOUNTER — Encounter (HOSPITAL_COMMUNITY): Payer: Self-pay | Admitting: *Deleted

## 2017-12-09 NOTE — Progress Notes (Signed)
Spoke with pt for pre-op call. Pt denies cardiac history, chest pain, HTN or diabetes. Pt states she takes Propanolol to prevent migraines.

## 2017-12-10 ENCOUNTER — Ambulatory Visit (HOSPITAL_COMMUNITY): Payer: 59 | Admitting: Anesthesiology

## 2017-12-10 ENCOUNTER — Encounter (HOSPITAL_COMMUNITY)
Admission: RE | Disposition: A | Discharge status: Home / Self Care | Payer: Self-pay | Source: Ambulatory Visit | Attending: Ophthalmology

## 2017-12-10 ENCOUNTER — Ambulatory Visit (HOSPITAL_COMMUNITY)
Admission: RE | Admit: 2017-12-10 | Discharge: 2017-12-10 | Disposition: A | Discharge status: Home / Self Care | Payer: 59 | Source: Ambulatory Visit | Attending: Ophthalmology | Admitting: Ophthalmology

## 2017-12-10 ENCOUNTER — Encounter (HOSPITAL_COMMUNITY): Payer: Self-pay | Admitting: Anesthesiology

## 2017-12-10 DIAGNOSIS — H35341 Macular cyst, hole, or pseudohole, right eye: Secondary | ICD-10-CM | POA: Diagnosis not present

## 2017-12-10 DIAGNOSIS — E669 Obesity, unspecified: Secondary | ICD-10-CM | POA: Insufficient documentation

## 2017-12-10 DIAGNOSIS — K219 Gastro-esophageal reflux disease without esophagitis: Secondary | ICD-10-CM | POA: Diagnosis not present

## 2017-12-10 DIAGNOSIS — Z6834 Body mass index (BMI) 34.0-34.9, adult: Secondary | ICD-10-CM | POA: Diagnosis not present

## 2017-12-10 DIAGNOSIS — H5461 Unqualified visual loss, right eye, normal vision left eye: Secondary | ICD-10-CM | POA: Diagnosis present

## 2017-12-10 DIAGNOSIS — H33311 Horseshoe tear of retina without detachment, right eye: Secondary | ICD-10-CM | POA: Insufficient documentation

## 2017-12-10 DIAGNOSIS — Z87891 Personal history of nicotine dependence: Secondary | ICD-10-CM | POA: Insufficient documentation

## 2017-12-10 DIAGNOSIS — Z83518 Family history of other specified eye disorder: Secondary | ICD-10-CM | POA: Diagnosis not present

## 2017-12-10 HISTORY — DX: Unspecified convulsions: R56.9

## 2017-12-10 HISTORY — DX: Gastro-esophageal reflux disease without esophagitis: K21.9

## 2017-12-10 HISTORY — PX: 25 GAUGE PARS PLANA VITRECTOMY WITH 20 GAUGE MVR PORT FOR MACULAR HOLE: SHX6096

## 2017-12-10 LAB — BASIC METABOLIC PANEL
Anion gap: 11 (ref 5–15)
BUN: 7 mg/dL (ref 6–20)
CALCIUM: 9.4 mg/dL (ref 8.9–10.3)
CO2: 23 mmol/L (ref 22–32)
Chloride: 105 mmol/L (ref 101–111)
Creatinine, Ser: 0.8 mg/dL (ref 0.44–1.00)
Glucose, Bld: 103 mg/dL — ABNORMAL HIGH (ref 65–99)
Potassium: 3.9 mmol/L (ref 3.5–5.1)
SODIUM: 139 mmol/L (ref 135–145)

## 2017-12-10 LAB — CBC
HCT: 42.4 % (ref 36.0–46.0)
Hemoglobin: 14.2 g/dL (ref 12.0–15.0)
MCH: 29.4 pg (ref 26.0–34.0)
MCHC: 33.5 g/dL (ref 30.0–36.0)
MCV: 87.8 fL (ref 78.0–100.0)
PLATELETS: 260 10*3/uL (ref 150–400)
RBC: 4.83 MIL/uL (ref 3.87–5.11)
RDW: 13.2 % (ref 11.5–15.5)
WBC: 4.7 10*3/uL (ref 4.0–10.5)

## 2017-12-10 SURGERY — 25 GAUGE PARS PLANA VITRECTOMY WITH 20 GAUGE MVR PORT FOR MACULAR HOLE
Anesthesia: General | Site: Eye | Laterality: Right

## 2017-12-10 MED ORDER — POLYMYXIN B SULFATE 500000 UNITS IJ SOLR
INTRAMUSCULAR | Status: AC
  Filled 2017-12-10: qty 500000

## 2017-12-10 MED ORDER — PREDNISOLONE ACETATE 1 % OP SUSP
OPHTHALMIC | Status: DC | PRN
  Administered 2017-12-10: 1 [drp] via OPHTHALMIC

## 2017-12-10 MED ORDER — DORZOLAMIDE HCL-TIMOLOL MAL 2-0.5 % OP SOLN
OPHTHALMIC | Status: AC
  Filled 2017-12-10: qty 10

## 2017-12-10 MED ORDER — NA CHONDROIT SULF-NA HYALURON 40-30 MG/ML IO SOLN
INTRAOCULAR | Status: DC | PRN
  Administered 2017-12-10 (×2): 0.5 mL via INTRAOCULAR

## 2017-12-10 MED ORDER — LIDOCAINE HCL (PF) 2 % IJ SOLN
INTRAMUSCULAR | Status: AC
  Filled 2017-12-10: qty 10

## 2017-12-10 MED ORDER — TRIAMCINOLONE ACETONIDE 40 MG/ML IJ SUSP
INTRAMUSCULAR | Status: DC | PRN
  Administered 2017-12-10: .5 mL

## 2017-12-10 MED ORDER — SODIUM CHLORIDE 0.9 % IV SOLN
INTRAVENOUS | Status: DC | PRN
  Administered 2017-12-10 (×2): via INTRAVENOUS

## 2017-12-10 MED ORDER — STERILE WATER FOR INJECTION IJ SOLN
INTRAMUSCULAR | Status: DC | PRN
  Administered 2017-12-10: 20 mL

## 2017-12-10 MED ORDER — GATIFLOXACIN 0.5 % OP SOLN OPTIME - NO CHARGE
OPHTHALMIC | Status: DC | PRN
  Administered 2017-12-10: 1 [drp] via OPHTHALMIC

## 2017-12-10 MED ORDER — 0.9 % SODIUM CHLORIDE (POUR BTL) OPTIME
TOPICAL | Status: DC | PRN
  Administered 2017-12-10: 200 mL

## 2017-12-10 MED ORDER — PREDNISOLONE ACETATE 1 % OP SUSP
OPHTHALMIC | Status: AC
  Filled 2017-12-10: qty 5

## 2017-12-10 MED ORDER — BRIMONIDINE TARTRATE 0.2 % OP SOLN
OPHTHALMIC | Status: AC
  Filled 2017-12-10: qty 5

## 2017-12-10 MED ORDER — ROCURONIUM BROMIDE 10 MG/ML (PF) SYRINGE
PREFILLED_SYRINGE | INTRAVENOUS | Status: AC
  Filled 2017-12-10: qty 5

## 2017-12-10 MED ORDER — PHENYLEPHRINE HCL 10 MG/ML IJ SOLN
INTRAVENOUS | Status: DC | PRN
  Administered 2017-12-10: 20 ug/min via INTRAVENOUS

## 2017-12-10 MED ORDER — EPINEPHRINE PF 1 MG/ML IJ SOLN
INTRAOCULAR | Status: DC | PRN
  Administered 2017-12-10: 13:00:00

## 2017-12-10 MED ORDER — PHENYLEPHRINE HCL 10 % OP SOLN
1.0000 [drp] | OPHTHALMIC | Status: AC | PRN
  Administered 2017-12-10 (×3): 1 [drp] via OPHTHALMIC
  Filled 2017-12-10: qty 5

## 2017-12-10 MED ORDER — SODIUM CHLORIDE 0.9 % IJ SOLN
INTRAMUSCULAR | Status: AC
  Filled 2017-12-10: qty 10

## 2017-12-10 MED ORDER — ATROPINE SULFATE 1 % OP SOLN
OPHTHALMIC | Status: AC
  Filled 2017-12-10: qty 5

## 2017-12-10 MED ORDER — OXYCODONE HCL 5 MG PO TABS
5.0000 mg | ORAL_TABLET | Freq: Once | ORAL | Status: AC
  Administered 2017-12-10: 5 mg via ORAL

## 2017-12-10 MED ORDER — HYDROMORPHONE HCL 1 MG/ML IJ SOLN
0.2500 mg | INTRAMUSCULAR | Status: DC | PRN
  Administered 2017-12-10 (×2): 0.5 mg via INTRAVENOUS

## 2017-12-10 MED ORDER — BACITRACIN-POLYMYXIN B 500-10000 UNIT/GM OP OINT
TOPICAL_OINTMENT | OPHTHALMIC | Status: AC
  Filled 2017-12-10: qty 3.5

## 2017-12-10 MED ORDER — EPINEPHRINE PF 1 MG/ML IJ SOLN
INTRAMUSCULAR | Status: AC
  Filled 2017-12-10: qty 1

## 2017-12-10 MED ORDER — STERILE WATER FOR INJECTION IJ SOLN
INTRAMUSCULAR | Status: DC | PRN
  Administered 2017-12-10: 200 mL

## 2017-12-10 MED ORDER — TROPICAMIDE 1 % OP SOLN
1.0000 [drp] | OPHTHALMIC | Status: AC | PRN
  Administered 2017-12-10 (×3): 1 [drp] via OPHTHALMIC
  Filled 2017-12-10: qty 15

## 2017-12-10 MED ORDER — BSS PLUS IO SOLN
INTRAOCULAR | Status: AC
  Filled 2017-12-10: qty 500

## 2017-12-10 MED ORDER — NA CHONDROIT SULF-NA HYALURON 40-30 MG/ML IO SOLN
INTRAOCULAR | Status: AC
  Filled 2017-12-10: qty 1

## 2017-12-10 MED ORDER — LIDOCAINE HCL (PF) 4 % IJ SOLN
INTRAMUSCULAR | Status: AC
  Filled 2017-12-10: qty 5

## 2017-12-10 MED ORDER — ACETAZOLAMIDE SODIUM 500 MG IJ SOLR
INTRAMUSCULAR | Status: AC
  Filled 2017-12-10: qty 500

## 2017-12-10 MED ORDER — BUPIVACAINE HCL (PF) 0.75 % IJ SOLN
INTRAMUSCULAR | Status: AC
  Filled 2017-12-10: qty 10

## 2017-12-10 MED ORDER — PROMETHAZINE HCL 25 MG/ML IJ SOLN: 6.2500 mg | INTRAMUSCULAR | Status: DC | PRN

## 2017-12-10 MED ORDER — HYDROMORPHONE HCL 1 MG/ML IJ SOLN
INTRAMUSCULAR | Status: AC
  Filled 2017-12-10: qty 1

## 2017-12-10 MED ORDER — ROCURONIUM BROMIDE 100 MG/10ML IV SOLN
INTRAVENOUS | Status: DC | PRN
  Administered 2017-12-10 (×2): 20 mg via INTRAVENOUS
  Administered 2017-12-10: 50 mg via INTRAVENOUS

## 2017-12-10 MED ORDER — GATIFLOXACIN 0.5 % OP SOLN
OPHTHALMIC | Status: AC
  Filled 2017-12-10: qty 2.5

## 2017-12-10 MED ORDER — DORZOLAMIDE HCL-TIMOLOL MAL 2-0.5 % OP SOLN
OPHTHALMIC | Status: DC | PRN
  Administered 2017-12-10: 1 [drp] via OPHTHALMIC

## 2017-12-10 MED ORDER — DEXAMETHASONE SODIUM PHOSPHATE 10 MG/ML IJ SOLN
INTRAMUSCULAR | Status: AC
  Filled 2017-12-10: qty 3

## 2017-12-10 MED ORDER — STERILE WATER FOR INJECTION IJ SOLN
INTRAMUSCULAR | Status: AC
Start: 2017-12-10 — End: 2017-12-10
  Filled 2017-12-10: qty 10

## 2017-12-10 MED ORDER — ONDANSETRON HCL 4 MG/2ML IJ SOLN
INTRAMUSCULAR | Status: AC
Start: 2017-12-10 — End: 2017-12-10
  Filled 2017-12-10: qty 6

## 2017-12-10 MED ORDER — INDOCYANINE GREEN 25 MG IV SOLR
INTRAVENOUS | Status: AC
  Filled 2017-12-10: qty 25

## 2017-12-10 MED ORDER — OXYCODONE HCL 5 MG PO TABS
ORAL_TABLET | ORAL | Status: AC
  Filled 2017-12-10: qty 1

## 2017-12-10 MED ORDER — BSS IO SOLN
INTRAOCULAR | Status: DC | PRN
  Administered 2017-12-10: 15 mL via INTRAOCULAR

## 2017-12-10 MED ORDER — PROPOFOL 10 MG/ML IV BOLUS
INTRAVENOUS | Status: AC
  Filled 2017-12-10: qty 20

## 2017-12-10 MED ORDER — NA CHONDROIT SULF-NA HYALURON 40-30 MG/ML IO SOLN
INTRAOCULAR | Status: AC
  Filled 2017-12-10: qty 0.5

## 2017-12-10 MED ORDER — BRIMONIDINE TARTRATE 0.2 % OP SOLN
OPHTHALMIC | Status: DC | PRN
  Administered 2017-12-10: 1 [drp] via OPHTHALMIC

## 2017-12-10 MED ORDER — DEXAMETHASONE SODIUM PHOSPHATE 10 MG/ML IJ SOLN
INTRAMUSCULAR | Status: DC | PRN
  Administered 2017-12-10: 10 mg via INTRAVENOUS

## 2017-12-10 MED ORDER — LIDOCAINE 2% (20 MG/ML) 5 ML SYRINGE
INTRAMUSCULAR | Status: AC
  Filled 2017-12-10: qty 10

## 2017-12-10 MED ORDER — SUGAMMADEX SODIUM 200 MG/2ML IV SOLN
INTRAVENOUS | Status: AC
  Filled 2017-12-10: qty 2

## 2017-12-10 MED ORDER — FENTANYL CITRATE (PF) 250 MCG/5ML IJ SOLN
INTRAMUSCULAR | Status: AC
  Filled 2017-12-10: qty 5

## 2017-12-10 MED ORDER — FENTANYL CITRATE (PF) 100 MCG/2ML IJ SOLN
INTRAMUSCULAR | Status: DC | PRN
  Administered 2017-12-10 (×3): 50 ug via INTRAVENOUS
  Administered 2017-12-10: 100 ug via INTRAVENOUS

## 2017-12-10 MED ORDER — SODIUM CHLORIDE 0.9 % IV SOLN: INTRAVENOUS | Status: DC | PRN

## 2017-12-10 MED ORDER — PROPARACAINE HCL 0.5 % OP SOLN
1.0000 [drp] | OPHTHALMIC | Status: AC | PRN
  Administered 2017-12-10 (×3): 1 [drp] via OPHTHALMIC
  Filled 2017-12-10: qty 15

## 2017-12-10 MED ORDER — DEXAMETHASONE SODIUM PHOSPHATE 10 MG/ML IJ SOLN
INTRAMUSCULAR | Status: AC
  Filled 2017-12-10: qty 1

## 2017-12-10 MED ORDER — PROPOFOL 10 MG/ML IV BOLUS
INTRAVENOUS | Status: DC | PRN
  Administered 2017-12-10: 180 mg via INTRAVENOUS

## 2017-12-10 MED ORDER — CEFTAZIDIME 1 G IJ SOLR
INTRAMUSCULAR | Status: AC
  Filled 2017-12-10: qty 1

## 2017-12-10 MED ORDER — BACITRACIN-POLYMYXIN B OP OINT
TOPICAL_OINTMENT | OPHTHALMIC | Status: DC | PRN
  Administered 2017-12-10: 1 via OPHTHALMIC

## 2017-12-10 MED ORDER — ONDANSETRON HCL 4 MG/2ML IJ SOLN
INTRAMUSCULAR | Status: DC | PRN
  Administered 2017-12-10: 4 mg via INTRAVENOUS

## 2017-12-10 MED ORDER — ATROPINE SULFATE 1 % OP SOLN
1.0000 [drp] | OPHTHALMIC | Status: AC | PRN
  Administered 2017-12-10 (×3): 1 [drp] via OPHTHALMIC
  Filled 2017-12-10: qty 5

## 2017-12-10 MED ORDER — INDOCYANINE GREEN 25 MG IV SOLR
INTRAVENOUS | Status: DC | PRN
  Administered 2017-12-10: 25 mg via OPHTHALMIC

## 2017-12-10 MED ORDER — DEXTROSE 50 % IV SOLN
INTRAVENOUS | Status: AC
  Filled 2017-12-10: qty 50

## 2017-12-10 MED ORDER — CARBACHOL 0.01 % IO SOLN
INTRAOCULAR | Status: AC
  Filled 2017-12-10: qty 1.5

## 2017-12-10 MED ORDER — MIDAZOLAM HCL 2 MG/2ML IJ SOLN
INTRAMUSCULAR | Status: AC
  Filled 2017-12-10: qty 2

## 2017-12-10 MED ORDER — TRIAMCINOLONE ACETONIDE 40 MG/ML IJ SUSP
INTRAMUSCULAR | Status: AC
  Filled 2017-12-10: qty 5

## 2017-12-10 MED ORDER — ATROPINE SULFATE 1 % OP SOLN
OPHTHALMIC | Status: DC | PRN
  Administered 2017-12-10: 1 [drp] via OPHTHALMIC

## 2017-12-10 MED ORDER — MIDAZOLAM HCL 5 MG/5ML IJ SOLN
INTRAMUSCULAR | Status: DC | PRN
  Administered 2017-12-10: 2 mg via INTRAVENOUS

## 2017-12-10 MED ORDER — SUGAMMADEX SODIUM 200 MG/2ML IV SOLN
INTRAVENOUS | Status: DC | PRN
  Administered 2017-12-10: 200 mg via INTRAVENOUS

## 2017-12-10 MED ORDER — LIDOCAINE HCL (CARDIAC) 20 MG/ML IV SOLN
INTRAVENOUS | Status: DC | PRN
  Administered 2017-12-10: 80 mg via INTRAVENOUS

## 2017-12-10 MED ORDER — TOBRAMYCIN-DEXAMETHASONE 0.3-0.1 % OP OINT
TOPICAL_OINTMENT | OPHTHALMIC | Status: AC
  Filled 2017-12-10: qty 3.5

## 2017-12-10 MED ORDER — BSS IO SOLN
INTRAOCULAR | Status: AC
  Filled 2017-12-10: qty 15

## 2017-12-10 SURGICAL SUPPLY — 71 items
APPLICATOR COTTON TIP 6IN STRL (MISCELLANEOUS) ×12 IMPLANT
APPLICATOR DR MATTHEWS STRL (MISCELLANEOUS) ×4 IMPLANT
BANDAGE EYE OVAL (MISCELLANEOUS) ×2 IMPLANT
BLADE EYE CATARACT 19 1.4 BEAV (BLADE) IMPLANT
BLADE MVR KNIFE 20G (BLADE) ×2 IMPLANT
CABLE BIPOLOR RESECTION CORD (MISCELLANEOUS) IMPLANT
CANNULA ANT CHAM MAIN (OPHTHALMIC RELATED) IMPLANT
CANNULA FLEX TIP 25G (CANNULA) ×2 IMPLANT
CANNULA TROCAR 23 GA VLV (OPHTHALMIC) IMPLANT
CANNULA VLV SOFT TIP 25GA (OPHTHALMIC) ×2 IMPLANT
CLSR STERI-STRIP ANTIMIC 1/2X4 (GAUZE/BANDAGES/DRESSINGS) ×2 IMPLANT
DRAPE MICROSCOPE LEICA 46X105 (MISCELLANEOUS) ×2 IMPLANT
DRAPE OPHTHALMIC 77X100 STRL (CUSTOM PROCEDURE TRAY) ×2 IMPLANT
ERASER HMR WETFIELD 23G BP (MISCELLANEOUS) IMPLANT
FILTER BLUE MILLIPORE (MISCELLANEOUS) ×4 IMPLANT
FILTER STRAW FLUID ASPIR (MISCELLANEOUS) IMPLANT
FORCEPS GRIESHABER ILM 25G A (INSTRUMENTS) ×2 IMPLANT
GAS AUTO FILL CONSTEL (OPHTHALMIC) ×2
GAS AUTO FILL CONSTELLATION (OPHTHALMIC) ×1 IMPLANT
GLOVE BIO SURGEON STRL SZ7.5 (GLOVE) ×2 IMPLANT
GLOVE BIOGEL M 7.0 STRL (GLOVE) ×2 IMPLANT
GLOVE SKINSENSE NS SZ7.0 (GLOVE) ×1
GLOVE SKINSENSE STRL SZ7.0 (GLOVE) ×1 IMPLANT
GOWN STRL REUS W/ TWL LRG LVL3 (GOWN DISPOSABLE) ×2 IMPLANT
GOWN STRL REUS W/ TWL XL LVL3 (GOWN DISPOSABLE) ×1 IMPLANT
GOWN STRL REUS W/TWL LRG LVL3 (GOWN DISPOSABLE) ×2
GOWN STRL REUS W/TWL XL LVL3 (GOWN DISPOSABLE) ×1
KIT BASIN OR (CUSTOM PROCEDURE TRAY) ×2 IMPLANT
KIT PERFLUORON PROCEDURE 5ML (MISCELLANEOUS) IMPLANT
LENS BIOM SUPER VIEW SET DISP (OPHTHALMIC RELATED) IMPLANT
LENS VITRECTOMY FLAT OCLR DISP (MISCELLANEOUS) IMPLANT
MICROPICK 25G (MISCELLANEOUS)
NEEDLE 18GX1X1/2 (RX/OR ONLY) (NEEDLE) ×10 IMPLANT
NEEDLE 25GX 5/8IN NON SAFETY (NEEDLE) ×12 IMPLANT
NEEDLE FILTER BLUNT 18X 1/2SAF (NEEDLE) ×1
NEEDLE FILTER BLUNT 18X1 1/2 (NEEDLE) ×1 IMPLANT
NEEDLE HYPO 30X.5 LL (NEEDLE) ×4 IMPLANT
NEEDLE PRECISIONGLIDE 27X1.5 (NEEDLE) IMPLANT
NS IRRIG 1000ML POUR BTL (IV SOLUTION) ×2 IMPLANT
PACK VITRECTOMY CUSTOM (CUSTOM PROCEDURE TRAY) ×2 IMPLANT
PAD ARMBOARD 7.5X6 YLW CONV (MISCELLANEOUS) ×4 IMPLANT
PAK PIK VITRECTOMY CVS 25GA (OPHTHALMIC) ×2 IMPLANT
PENCIL BIPOLAR 25GA STR DISP (OPHTHALMIC RELATED) ×2 IMPLANT
PICK MICROPICK 25G (MISCELLANEOUS) IMPLANT
PROBE DIATHERMY DSP 27GA (MISCELLANEOUS) IMPLANT
PROBE ENDO DIATHERMY 25G (MISCELLANEOUS) ×2 IMPLANT
PROBE LASER ILLUM FLEX CVD 25G (OPHTHALMIC) IMPLANT
REPL STRA BRUSH NEEDLE (NEEDLE) IMPLANT
RESERVOIR BACK FLUSH (MISCELLANEOUS) IMPLANT
RETRACTOR IRIS FLEX 25G GRIESH (INSTRUMENTS) IMPLANT
ROLLS DENTAL (MISCELLANEOUS) ×4 IMPLANT
SPONGE SURGIFOAM ABS GEL 12-7 (HEMOSTASIS) IMPLANT
STOPCOCK 4 WAY LG BORE MALE ST (IV SETS) IMPLANT
SUT CHROMIC 7 0 TG140 8 (SUTURE) ×2 IMPLANT
SUT ETHILON 5.0 S-24 (SUTURE) ×2 IMPLANT
SUT ETHILON 9 0 TG140 8 (SUTURE) ×2 IMPLANT
SUT MERSILENE 4 0 RV 2 (SUTURE) ×2 IMPLANT
SUT SILK 2 0 (SUTURE) ×1
SUT SILK 2 0 TIES 17X18 (SUTURE) ×1
SUT SILK 2-0 18XBRD TIE 12 (SUTURE) ×1 IMPLANT
SUT SILK 2-0 18XBRD TIE BLK (SUTURE) ×1 IMPLANT
SUT SILK 4 0 RB 1 (SUTURE) ×2 IMPLANT
SUT VICRYL 7 0 TG140 8 (SUTURE) ×2 IMPLANT
SYR 10ML LL (SYRINGE) ×8 IMPLANT
SYR 20CC LL (SYRINGE) ×2 IMPLANT
SYR 5ML LL (SYRINGE) ×6 IMPLANT
SYR BULB 3OZ (MISCELLANEOUS) ×2 IMPLANT
SYR TB 1ML LUER SLIP (SYRINGE) IMPLANT
TOWEL NATURAL 6PK STERILE (DISPOSABLE) ×2 IMPLANT
TUBING HIGH PRESS EXTEN 6IN (TUBING) ×2 IMPLANT
WATER STERILE IRR 1000ML POUR (IV SOLUTION) ×2 IMPLANT

## 2017-12-10 NOTE — Interval H&P Note (Signed)
History and Physical Interval Note:  12/10/2017 11:21 AM  Sandra DupesLaurie A XXXMarinelli  has presented today for surgery, with the diagnosis of full thickness macular hole and retina tear  The various methods of treatment have been discussed with the patient and family. After consideration of risks, benefits and other options for treatment, the patient has consented to  Procedure(s): PARS PLANA VITRECTOMY WITH 25 GAUGE LAZER AND GAS MACULAR HOLE (Right) as a surgical intervention .  The patient's history has been reviewed, patient examined, no change in status, stable for surgery.  I have reviewed the patient's chart and labs.  Questions were answered to the patient's satisfaction.     Rennis ChrisBrian Sharen Youngren

## 2017-12-10 NOTE — Discharge Instructions (Signed)
POSTOPERATIVE INSTRUCTIONS  Your [facility]  - Keep eye patched and shielded until seen by Dr. Vanessa BarbaraZamora at 915 AM tomorrow in clinic - Do not use drops until return - FACE DOWN POSITIONING WHILE AWAKE - Sleep with belly down or on left side, avoid laying flat on back.    - No strenuous bending, stooping or lifting.  - You may not drive until further notice.  - If your doctor used a gas bubble in your eye during the procedure he will advise you on postoperative positioning. If you have a gas bubble you will be wearing a green bracelet that was applied in the operating room. The green bracelet should stay on as long as the gas bubble is in your eye. While the gas bubble is present you should not fly in an airplane. If you require general anesthesia while the gas bubble is present you must notify your anesthesiologist that an intraocular gas bubble is present so he can take the appropriate precautions.  - Tylenol or any other over-the-counter pain reliever can be used according to your doctor.  - You may read, go up and down stairs, and watch television.     Rennis ChrisBrian Shelly Shoultz, M.D., Ph.D.

## 2017-12-10 NOTE — Op Note (Signed)
Date of procedure:  12/10/2017  Surgeon: Bernarda Caffey, MD, PhD  Pre-operative Diagnoses: Full thickness macular hole, Right Eye Retinal tears, Right Eye  Post-operative diagnosis: Full thickness macular hole, Right Eye Retinal tears, Right Eye  Anesthesia: General  Procedure:  1. 25 gauge pars plana vitrectomy, Right Eye 2. Indocyanine green stain, Right Eye 3. Internal Limiting Membrane peel, Right Eye 4. Endolaser, Right Eye 5. Injection 14% C3F8, Right Eye   Indications for procedure: The patient presented with a full thickness macular hole and complaint of central visual loss consistent with a scotoma. After discussing the risks, benefits, and alternatives to surgery, the patient electively decided to undergo surgical repair and informed consent was obtained. The surgery was an attempt to close the macular hole and potentially improve the vision within the reasonable expectations of the surgeon.  Procedure in Detail:  The patient was met in the pre-operative holding area where their identification data was verified. It was noted that there was a signed, informed consent in the chart and the Rightt Eye eye was verbally verified by the patient as the operative eye and was marked with a marking pen.The patient was then taken to the operating room and placed in the supine position. General endotracheal anesthesia was induced.  The eye was then prepped with 5% betadine and draped in the normal fashion for ophthalmic surgery. The microscope was draped and swung into position, and a secondary time-out was performed to identify the correct patient, eyes, procedures, and any allergies.  A 25 gauge trocar was inserted in a 30-45 degrees fashion into the inferotemporal quadrant 3.5 mm posterior to the limbus in this pseudophakic patient. Correct positioning within the vitreous was verified externally with the light pipe. The infusion was then connected to the cannula and BSS  infusion was commenced. Additional ports were placed in the superonasal and superotemporal quadrants.Viscoat was placed on the cornea and direct vitrectomy was performed. The BIOM was used to visualize the posterior segment while the core vitrectomy was completed. The patient had a visible full thickness macular hole and a posterior vitreous detachment was confirmed using suction over the optic nerve head and lifting anteriorly. The remaining vitreous was removed. Kenalog was used to aid in this process. A thorough peripheral vitrectomy was performed. Of note, there were multiple retinal tears along the vitreous base 360 OD.   Pre-diluted indocyanine green was then used to stain the internal limiting membrane. A macular contact lens was placed on the eye. End-grasping ILM forceps were used to create an opening in the ILM and the ILM was peeled fully from the macula taking care to avoid traction on the macular hole.   The wide angle viewing system was brought back into position. Scleral depression was performed and used to meticulously shave the thick and adherent vitreous base. Multiple retinal tears were noted almost 360--most prominently superonasal and infero temporal. Endolaser was used to surround these areas with three rows of laser spots plus additional 360 laser. An air fluid exchange was performed.  The superotemporal port was then removed and sutured with 7-0 vicryl, there was no leakage. 14% C3F8 gas was connected to the infusion line and gas was injected into the posterior segment while venting air through the superonasal trocar using the extrusion cannula. Once a full, 40cc of gas was vented through the eye, the infusion port and venting ports were removed and they were sutured with 7-0 vicryl. There was no leakage from the sclerotomy sites.  Subconjunctival injections of  kefzol + bacitracin + polymixin b and kenalog were then administered, and antibiotic and steroid drops as well as  antibiotic ointment were placed in the eye. The drapes were removed and the eye was patched and shielded. A green gas bracelet was placed on the patient's left wrist. The patient was then taken to the post-operative area for recovery having tolerated the procedure well. She was instructed to perform face down positioning postoperatively and to follow up in clinic the following morning as scheduled.  Estimate blood lost: none Complications: None

## 2017-12-10 NOTE — Anesthesia Procedure Notes (Signed)
Procedure Name: Intubation Date/Time: 12/10/2017 12:14 PM Performed by: Candis Shine, CRNA Pre-anesthesia Checklist: Patient identified, Emergency Drugs available, Suction available and Patient being monitored Patient Re-evaluated:Patient Re-evaluated prior to induction Oxygen Delivery Method: Circle System Utilized Preoxygenation: Pre-oxygenation with 100% oxygen Induction Type: IV induction Ventilation: Mask ventilation without difficulty Laryngoscope Size: Mac and 3 Grade View: Grade I Tube type: Oral Tube size: 7.0 mm Number of attempts: 1 Airway Equipment and Method: Stylet Placement Confirmation: ETT inserted through vocal cords under direct vision,  positive ETCO2 and breath sounds checked- equal and bilateral Secured at: 22 cm Tube secured with: Tape Dental Injury: Teeth and Oropharynx as per pre-operative assessment

## 2017-12-10 NOTE — Anesthesia Preprocedure Evaluation (Signed)
Anesthesia Evaluation  Patient identified by MRN, date of birth, ID band Patient awake    Reviewed: Allergy & Precautions, NPO status , Patient's Chart, lab work & pertinent test results  Airway Mallampati: II  TM Distance: >3 FB Neck ROM: Full    Dental no notable dental hx. (+) Teeth Intact   Pulmonary former smoker,    Pulmonary exam normal breath sounds clear to auscultation       Cardiovascular negative cardio ROS Normal cardiovascular exam Rhythm:Regular Rate:Normal     Neuro/Psych  Headaches, Seizures -, Well Controlled,  PSYCHIATRIC DISORDERS Anxiety Depression PTSDRetinal detachment    GI/Hepatic Neg liver ROS, GERD  Medicated and Controlled,  Endo/Other  Obesity  Renal/GU negative Renal ROS  negative genitourinary   Musculoskeletal negative musculoskeletal ROS (+)   Abdominal (+) + obese,   Peds  Hematology negative hematology ROS (+)   Anesthesia Other Findings   Reproductive/Obstetrics                             Anesthesia Physical Anesthesia Plan  ASA: II  Anesthesia Plan: General   Post-op Pain Management:    Induction: Intravenous  PONV Risk Score and Plan: 4 or greater and Ondansetron, Dexamethasone, Midazolam and Treatment may vary due to age or medical condition  Airway Management Planned: Oral ETT  Additional Equipment:   Intra-op Plan:   Post-operative Plan: Extubation in OR  Informed Consent: I have reviewed the patients History and Physical, chart, labs and discussed the procedure including the risks, benefits and alternatives for the proposed anesthesia with the patient or authorized representative who has indicated his/her understanding and acceptance.   Dental advisory given  Plan Discussed with: CRNA, Anesthesiologist and Surgeon  Anesthesia Plan Comments:         Anesthesia Quick Evaluation

## 2017-12-10 NOTE — Progress Notes (Signed)
Green Gas Bracelet placed on right arm with doctors number

## 2017-12-10 NOTE — Brief Op Note (Signed)
12/10/2017  2:55 PM  PATIENT:  Sandra Herring  57 y.o. female  PRE-OPERATIVE DIAGNOSIS:  full thickness macular hole and retinal tears, right eye  POST-OPERATIVE DIAGNOSIS:  Full thickness macular hole and retinal tears, right eye  PROCEDURE:  Procedure(s): PARS PLANA VITRECTOMY WITH 25 GAUGE LAZER AND GAS MACULAR HOLE (Right)  SURGEON:  Surgeon(s) and Role:    Rennis Chris* Sonali Wivell, MD - Primary  ASSISTANTS: Virgilio BellingMeredith Fabian, COA   ANESTHESIA:   general  EBL:  Minimal   BLOOD ADMINISTERED:none  DRAINS: none   LOCAL MEDICATIONS USED:  NONE  SPECIMEN:  No Specimen  DISPOSITION OF SPECIMEN:  N/A  COUNTS:  YES  TOURNIQUET:  * No tourniquets in log *  DICTATION: .Note written in EPIC  PLAN OF CARE: Discharge to home after PACU  PATIENT DISPOSITION:  PACU - hemodynamically stable.   Delay start of Pharmacological VTE agent (>24hrs) due to surgical blood loss or risk of bleeding: not applicable

## 2017-12-10 NOTE — Anesthesia Postprocedure Evaluation (Signed)
Anesthesia Post Note  Patient: Sandra Herring  Procedure(s) Performed: PARS PLANA VITRECTOMY WITH 25 GAUGE LAZER AND GAS MACULAR HOLE membrane pale gas (Right Eye)     Patient location during evaluation: PACU Anesthesia Type: General Level of consciousness: awake and alert Pain management: pain level controlled Vital Signs Assessment: post-procedure vital signs reviewed and stable Respiratory status: spontaneous breathing, nonlabored ventilation, respiratory function stable and patient connected to nasal cannula oxygen Cardiovascular status: blood pressure returned to baseline and stable Postop Assessment: no apparent nausea or vomiting Anesthetic complications: no    Last Vitals:  Vitals:   12/10/17 1532 12/10/17 1630  BP: 130/76   Pulse: 83   Resp: 14   Temp:  (!) (P) 36.3 C  SpO2: 97%                  Sandra Herring

## 2017-12-10 NOTE — Transfer of Care (Signed)
Immediate Anesthesia Transfer of Care Note  Patient: Sandra Herring  Procedure(s) Performed: PARS PLANA VITRECTOMY WITH 25 GAUGE LAZER AND GAS MACULAR HOLE membrane pale gas (Right Eye)  Patient Location: PACU  Anesthesia Type:General  Level of Consciousness: awake, alert  and oriented  Airway & Oxygen Therapy: Patient Spontanous Breathing and Patient connected to nasal cannula oxygen  Post-op Assessment: Report given to RN and Post -op Vital signs reviewed and stable  Post vital signs: Reviewed and stable  Last Vitals:  Vitals:   12/10/17 0959  BP: (!) 142/91  Pulse: 74  Resp: 18  Temp: 36.7 C  SpO2: 97%    Last Pain:  Vitals:   12/10/17 1101  TempSrc:   PainSc: 4       Patients Stated Pain Goal: 6 (12/10/17 1101)  Complications: No apparent anesthesia complications

## 2017-12-10 NOTE — Progress Notes (Signed)
[facility] Note  12/11/2017     CHIEF COMPLAINT Patient presents for Post-op Follow-up POD1 s/p 25g PPV/ICG/MP/EL/14% C3F8, OD, 2.14.19  HISTORY OF PRESENT ILLNESS: Sandra Herring is a 57 y.o. female who presents to the clinic today for:   HPI    Post-op Follow-up    In right eye.  Discomfort includes pain.  Negative for itching, foreign body sensation, tearing, discharge, floaters and none.  Vision is stable.  I, the attending physician,  performed the HPI with the patient and updated documentation appropriately.          Comments    Pt presents today for F/U POV day 1, s/p 25g PPV/ICG/MP/EL/FAX/14% C3F8 OD (02.14.19), pt states she has been in pain all night, it feels like her eyelashes were replaced with razor blades, she is upset bc her hair was taped to her head and it's painful, we removed the bandage, but she says it's still painful and sticky,        Last edited by Bernarda Caffey, MD on 12/11/2017 11:14 AM. (History)      Referring physician: Shanon Rosser, PA-C [place], [place] [id number]  HISTORICAL INFORMATION:   Selected notes from the MEDICAL RECORD NUMBER Referred by Dr. 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 Groat) [BCVA OD: 20/30+2 OS: 20/20] Ocular Hx- S/P focal laser OS (2007 - [place]), pseudophakia OU PMH- migraine, PTSD    CURRENT MEDICATIONS: Current Outpatient Medications (Ophthalmic Drugs)  Medication Sig  . prednisoLONE acetate (PRED FORTE) 1 % ophthalmic suspension Place 1 drop into the right eye 4 (four) times daily.   No current facility-administered medications for this visit.  (Ophthalmic Drugs)   Current Outpatient Medications (Other)  Medication Sig  . acetaminophen (TYLENOL) 500 MG tablet Take 1,000 mg by mouth daily as needed for moderate pain.   . Ascorbic Acid (VITAMIN C) 1000 MG tablet Take 4,000 mg by mouth daily.  . cyclobenzaprine (FLEXERIL) 10  MG tablet Take 10 mg by mouth at bedtime.  . diazepam (VALIUM) 5 MG tablet Take 5 mg by mouth at bedtime.   . famotidine (PEPCID) 20 MG tablet Take 20 mg by mouth daily as needed for heartburn.   Marland Kitchen OVER THE COUNTER MEDICATION Take 1,500 mg by mouth daily. Cordyceps otc supplement  . OVER THE COUNTER MEDICATION Take 3 capsules by mouth daily. Bone up otc supplement  . pantoprazole (PROTONIX) 20 MG tablet Take 1 tablet (20 mg total) by mouth daily. (Patient taking differently: Take 20 mg by mouth daily as needed for heartburn or indigestion. )  . propranolol (INDERAL) 20 MG tablet Take 20 mg by mouth at bedtime.   . sertraline (ZOLOFT) 100 MG tablet Take 150 mg by mouth daily.   . sodium chloride (OCEAN) 0.65 % SOLN nasal spray Place 1 spray into both nostrils as needed for congestion.  . traMADol (ULTRAM) 50 MG tablet Take 50 mg by mouth daily as needed for moderate pain.   . traZODone (DESYREL) 100 MG tablet Take 50 mg by mouth at bedtime.  . Vitamin D-Vitamin K (VITAMIN K2-VITAMIN D3 PO) Take 2 tablets by mouth daily.   No current facility-administered medications for this visit.  (Other)      REVIEW OF SYSTEMS: ROS    Negative for: Constitutional, Gastrointestinal, Neurological, Skin, Genitourinary, Musculoskeletal, HENT, Endocrine, Cardiovascular, Eyes, Respiratory, Psychiatric, Allergic/Imm, Heme/Lymph   Last edited by Cherrie Gauze  on 12/11/2017  9:41 AM. (History)       ALLERGIES Allergies  Allergen Reactions  . Serzone [Nefazodone] Shortness Of Breath    Breathing difficulty  . Nsaids Other (See Comments)    gastritis    PAST MEDICAL HISTORY Past Medical History:  Diagnosis Date  . Anxiety   . Depression   . GERD (gastroesophageal reflux disease)   . Headache    migraines  . PTSD (post-traumatic stress disorder)   . PTSD (post-traumatic stress disorder)   . Seizures ([place])    febrile seizure at age 41 months  . Thyroid nodule   . Vaginal infection    Past  Surgical History:  Procedure Laterality Date  . [place] VITRECTOMY WITH 20 GAUGE MVR PORT FOR MACULAR HOLE Right 12/10/2017   Procedure: PARS PLANA VITRECTOMY WITH 25 GAUGE LAZER AND GAS MACULAR HOLE membrane pale gas;  Surgeon: Bernarda Caffey, MD;  Location: [facility];  Service: Ophthalmology;  Laterality: Right;  . COLONOSCOPY    . HERNIA REPAIR    . TONSILLECTOMY  2005  . TUBAL LIGATION      FAMILY HISTORY Family History  Problem Relation Age of Onset  . Cataracts Mother   . Cataracts Father   . Cataracts Maternal Grandfather   . Amblyopia Neg Hx   . Blindness Neg Hx   . Glaucoma Neg Hx   . Diabetes Neg Hx   . Macular degeneration Neg Hx   . Retinal detachment Neg Hx   . Strabismus Neg Hx   . Retinitis pigmentosa Neg Hx     SOCIAL HISTORY Social History   Tobacco Use  . Smoking status: Former Smoker    Packs/day: 0.25    Last attempt to quit: 10/20/2014    Years since quitting: 3.1  . Smokeless tobacco: Never Used  Substance Use Topics  . Alcohol use: Yes    Comment: occ  . Drug use: No         OPHTHALMIC EXAM:  Base Eye Exam    Visual Acuity (Snellen - Linear)      Right Left   Dist [place] CF at face' 20/25 -1   Dist ph [place] NI 20/25       Tonometry (Tonopen, 9:45 AM)      Right Left   Pressure 19 18       Pupils      Dark Light Shape React APD   Right 6 6 Round NR None   Left 4 3 Round Brisk None       Neuro/Psych    Oriented x3:  Yes   Mood/Affect:  Normal       Dilation    Right eye:  1.0% Mydriacyl, 2.5% Phenylephrine @ 9:45 AM        Slit Lamp and Fundus Exam    Slit Lamp Exam      Right Left   Lids/Lashes Dermatochalasis - upper lid Dermatochalasis - upper lid   Conjunctiva/Sclera sutures intact; mild [place] and injection White and quiet   Cornea 1+ Descemet's folds, central epi defect 1+ Punctate epithelial erosions   Anterior Chamber 2+ Cell Deep and quiet   Iris Round and dilated, TIDs Round and dilated, Transillumination  defects from 0600 to 1100   Lens toric PC IOL with marks at 630 and 1230, mild fibrin over IOL PC IOL in good postion with open PC   Vitreous Post vitrectomy; gas bubble 90-95% Posterior vitreous detachment, vitreous debris  Fundus Exam      Right Left   Disc Normal    C/D Ratio 0.1 0.25   Macula Flat under gas, Macular hole closing    Vessels Mild Vascular attenuation    Periphery Attached, 360 laser extending posterior to peripheral tears           IMAGING AND PROCEDURES  Imaging and Procedures for 12/11/17           ASSESSMENT/PLAN:    ICD-10-CM   1. Macular hole of right eye H35.341   2. 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 Retinal edema H35.81   4. Pseudophakia of both eyes Z96.1   5. History of repair of retinal tear by laser photocoagulation Z98.890     1. Full thickness macular hole, OD POD1 s/p 25g PPV/ICG/MP/EL/FAX/14% C3F8 OD, 2.14.19             - doing well this morning  - mac hole closing under gas             - retina attached and in good position -- good laser in place             - IOP good             - start   PF 6x/day OD                         zymaxid QID OD                         Atropine BID OD                         Cosopt BID OD                         PSO ung QID OD             - cont face down positioning x5 days; avoid laying flat on back             - eye shield when sleeping             - post op drop and positioning instructions reviewed             - tylenol/ibuprofen for pain - f/u in 1 wk  2. Retinal tears OD-  - s/p laser retinopexy to inferotemporal tears on 2.12.19 - multiple additional tears noted intraoperatively and treated with endolaser on 2.14.19 - laser looks good  3. Retinal edema-  - pre-op: +CME surrounding macular hole  4. Pseudophakia OU  - s/p CE/IOL OU  - OD by Dr. Chauncey Cruel. Groat (09/2017) toric lens in good postion  - beautiful surgeries, doing well  -  monitor  5. History of RT with laser retinopexy OS - large, jagged retinal tear superiorly OS with good laser surrounding - however she does have a lot of vitreous debris and floaters, which may be contributing to her complaints of haze and glare following cataract surgery - BCVA OS is 20/20    Ophthalmic Meds Ordered this visit:  No orders of the defined types were placed in this encounter.      Return in about 1 week (around 12/18/2017) for as scheduled.  There are no Patient Instructions on file for this visit.   Explained the diagnoses, plan, and follow up with  the patient and they expressed understanding.  Patient expressed understanding of the importance of proper follow up care.      Gardiner Sleeper, M.D., Ph.D. Diseases & Surgery of the Retina and [place] 12/11/17     Abbreviations: M myopia (nearsighted); A astigmatism; H hyperopia (farsighted); P presbyopia; Mrx spectacle prescription;  CTL contact lenses; OD right eye; OS left eye; OU both eyes  XT exotropia; ET esotropia; PEK punctate epithelial keratitis; PEE punctate epithelial erosions; DES dry eye syndrome; MGD meibomian gland dysfunction; ATs artificial tears; PFAT's preservative free artificial tears; [place] nuclear sclerotic cataract; PSC posterior subcapsular cataract; ERM epi-retinal membrane; PVD posterior vitreous detachment; RD retinal detachment; DM diabetes mellitus; DR diabetic retinopathy; NPDR non-proliferative diabetic retinopathy; PDR proliferative diabetic retinopathy; CSME clinically significant macular edema; DME diabetic macular edema; dbh dot blot hemorrhages; CWS cotton wool spot; POAG primary open angle glaucoma; C/D cup-to-disc ratio; HVF humphrey visual field; GVF goldmann visual field; OCT optical coherence tomography; IOP intraocular pressure; BRVO Branch retinal vein occlusion; CRVO central retinal vein occlusion; CRAO central retinal artery occlusion; BRAO  branch retinal artery occlusion; RT retinal tear; SB scleral buckle; PPV pars plana vitrectomy; VH Vitreous hemorrhage; PRP panretinal laser photocoagulation; IVK intravitreal kenalog; VMT vitreomacular traction; MH Macular hole;  NVD neovascularization of the disc; NVE neovascularization elsewhere; AREDS age related eye disease study; ARMD age related macular degeneration; POAG primary open angle glaucoma; EBMD epithelial/anterior basement membrane dystrophy; ACIOL anterior chamber intraocular lens; IOL intraocular lens; PCIOL posterior chamber intraocular lens; Phaco/IOL phacoemulsification with intraocular lens placement; [place] photorefractive keratectomy; LASIK laser assisted in situ keratomileusis; HTN hypertension; DM diabetes mellitus; COPD chronic obstructive pulmonary disease

## 2017-12-11 ENCOUNTER — Encounter (HOSPITAL_COMMUNITY): Payer: Self-pay | Admitting: Ophthalmology

## 2017-12-11 ENCOUNTER — Ambulatory Visit (INDEPENDENT_AMBULATORY_CARE_PROVIDER_SITE_OTHER): Payer: 59 | Admitting: Ophthalmology

## 2017-12-11 DIAGNOSIS — Z961 Presence of intraocular lens: Secondary | ICD-10-CM

## 2017-12-11 DIAGNOSIS — Z9889 Other specified postprocedural states: Secondary | ICD-10-CM

## 2017-12-11 DIAGNOSIS — H3581 Retinal edema: Secondary | ICD-10-CM

## 2017-12-11 DIAGNOSIS — H33331 Multiple defects of retina without detachment, right eye: Secondary | ICD-10-CM

## 2017-12-11 DIAGNOSIS — H35341 Macular cyst, hole, or pseudohole, right eye: Secondary | ICD-10-CM

## 2017-12-14 MED FILL — Bacitracin-Polymyxin B Ophth Oint: OPHTHALMIC | Qty: 3.5 | Status: AC

## 2017-12-17 NOTE — Progress Notes (Signed)
[facility] - Clinic Note  12/18/2017     CHIEF COMPLAINT Patient presents for Post-op Follow-up POW1 s/p 25g PPV/ICG/MP/EL/14% C3F8, OD, 2.14.19  HISTORY OF PRESENT ILLNESS: Sandra Herring is a 57 y.o. female who presents to the clinic today for:   HPI    Post-op Follow-up    In right eye.  Discomfort includes pain, itching, tearing and discharge.  Negative for foreign body sensation, floaters and none.  Vision is blurred at near and is blurred at distance.  I, the attending physician,  performed the HPI with the patient and updated documentation appropriately.          Comments    POW 1 S/P 25g PPV/ICG/MP El/14% C3F8 OD (12/10/17) Patient stastes gas bubble upper part of right eye, she has maintained head position 90% of the time, she did lay flat for appx 30-45 minutes yesterday"out of frustration". Pt reports eye pain rates 9-10, cold compresses "helps some", is using Hyper tonic saline flushes ,occasional tearing , eye itches by the end of the day and  discharge upon waking up in the morning.  Pt states she is using gtt's is instructed.         Last edited by Rennis Chris, MD on 12/18/2017 11:32 AM. (History)    Pt states she is having pain; Pt states she is able to see a line where the bubble is; Pt states she laid on her back for 30-45 minutes yesterday;   Referring physician: Lindaann Pascal, PA-C [address] [place], [place] [address]  HISTORICAL INFORMATION:   Selected notes from the MEDICAL RECORD NUMBER Referred by Dr. Laruth Bouchard for concern of VMT with macular hole OD;  LEE- 12.13.18 (S. Groat) [BCVA OD: 20/30+2 OS: 20/20] Ocular Hx- S/P focal laser OS (2007 - [place]), pseudophakia OU PMH- migraine, PTSD    CURRENT MEDICATIONS: Current Outpatient Medications (Ophthalmic Drugs)  Medication Sig  . bacitracin-polymyxin b (POLYSPORIN) ophthalmic ointment Place into the right eye 4 (four) times daily for 10 days. Place a 1/2 inch ribbon  of ointment into the lower eyelid.  . prednisoLONE acetate (PRED FORTE) 1 % ophthalmic suspension Place 1 drop into the right eye 6 (six) times daily.   No current facility-administered medications for this visit.  (Ophthalmic Drugs)   Current Outpatient Medications (Other)  Medication Sig  . acetaminophen (TYLENOL) 500 MG tablet Take 1,000 mg by mouth daily as needed for moderate pain.   . Ascorbic Acid (VITAMIN C) 1000 MG tablet Take 4,000 mg by mouth daily.  . cyclobenzaprine (FLEXERIL) 10 MG tablet Take 10 mg by mouth at bedtime.  . diazepam (VALIUM) 5 MG tablet Take 5 mg by mouth at bedtime.   . famotidine (PEPCID) 20 MG tablet Take 20 mg by mouth daily as needed for heartburn.   Marland Kitchen OVER THE COUNTER MEDICATION Take 1,500 mg by mouth daily. Cordyceps otc supplement  . OVER THE COUNTER MEDICATION Take 3 capsules by mouth daily. Bone up otc supplement  . pantoprazole (PROTONIX) 20 MG tablet Take 1 tablet (20 mg total) by mouth daily. (Patient taking differently: Take 20 mg by mouth daily as needed for heartburn or indigestion. )  . propranolol (INDERAL) 20 MG tablet Take 20 mg by mouth at bedtime.   . sertraline (ZOLOFT) 100 MG tablet Take 150 mg by mouth daily.   . sodium chloride (OCEAN) 0.65 % SOLN nasal spray Place 1 spray into both nostrils as needed for congestion.  Marland Kitchen  traMADol (ULTRAM) 50 MG tablet Take 50 mg by mouth daily as needed for moderate pain.   . traZODone (DESYREL) 100 MG tablet Take 50 mg by mouth at bedtime.  . Vitamin D-Vitamin K (VITAMIN K2-VITAMIN D3 PO) Take 2 tablets by mouth daily.   No current facility-administered medications for this visit.  (Other)      REVIEW OF SYSTEMS: ROS    Positive for: Eyes   Negative for: Constitutional, Gastrointestinal, Neurological, Skin, Genitourinary, Musculoskeletal, HENT, Endocrine, Cardiovascular, Respiratory, Psychiatric, Allergic/Imm, Heme/Lymph   Last edited by Eldridge Scot, LPN on 1/61/0960 10:20 AM. (History)        ALLERGIES Allergies  Allergen Reactions  . Serzone [Nefazodone] Shortness Of Breath    Breathing difficulty  . Nsaids Other (See Comments)    gastritis    PAST MEDICAL HISTORY Past Medical History:  Diagnosis Date  . Anxiety   . Depression   . GERD (gastroesophageal reflux disease)   . Headache    migraines  . PTSD (post-traumatic stress disorder)   . PTSD (post-traumatic stress disorder)   . Seizures ([facility])    febrile seizure at age 43 months  . Thyroid nodule   . Vaginal infection    Past Surgical History:  Procedure Laterality Date  . 25 GAUGE PARS PLANA VITRECTOMY WITH 20 GAUGE MVR PORT FOR MACULAR HOLE Right 12/10/2017   Procedure: PARS PLANA VITRECTOMY WITH 25 GAUGE LAZER AND GAS MACULAR HOLE membrane pale gas;  Surgeon: Rennis Chris, MD;  Location: [facility] OR;  Service: Ophthalmology;  Laterality: Right;  . COLONOSCOPY    . HERNIA REPAIR    . TONSILLECTOMY  2005  . TUBAL LIGATION      FAMILY HISTORY Family History  Problem Relation Age of Onset  . Cataracts Mother   . Cataracts Father   . Cataracts Maternal Grandfather   . Amblyopia Neg Hx   . Blindness Neg Hx   . Glaucoma Neg Hx   . Diabetes Neg Hx   . Macular degeneration Neg Hx   . Retinal detachment Neg Hx   . Strabismus Neg Hx   . Retinitis pigmentosa Neg Hx     SOCIAL HISTORY Social History   Tobacco Use  . Smoking status: Former Smoker    Packs/day: 0.25    Last attempt to quit: 10/20/2014    Years since quitting: 3.1  . Smokeless tobacco: Never Used  Substance Use Topics  . Alcohol use: Yes    Comment: occ  . Drug use: No         OPHTHALMIC EXAM:  Base Eye Exam    Visual Acuity (Snellen - Linear)      Right Left   Dist [place] CF at face' 20/20   Dist ph [place] NI        Tonometry (Tonopen, 10:11 AM)      Right Left   Pressure 14 15       Pupils      Dark Light Shape React APD   Right 6 6 Round NR None   Left 5 3 Round Brisk None       Neuro/Psych    Oriented x3:  Yes    Mood/Affect:  Normal       Dilation    Right eye:  1.0% Mydriacyl, 2.5% Phenylephrine @ 10:13 AM        Slit Lamp and Fundus Exam    Slit Lamp Exam      Right Left   Lids/Lashes Dermatochalasis - upper lid  Dermatochalasis - upper lid   Conjunctiva/Sclera sutures intact; mild [place] and injection - improving White and quiet   Cornea 3+ Descemet's folds, central epi defect healed 1+ Punctate epithelial erosions   Anterior Chamber 70% gas bubble in AC Deep and quiet   Iris Round and dilated, TIDs Round and dilated, Transillumination defects from 0600 to 1100   Lens toric PC IOL with marks at 630 and 1230 PC IOL in good postion with open PC   Vitreous Post vitrectomy; gas bubble 90-95% Posterior vitreous detachment, vitreous debris       Fundus Exam      Right Left   Disc Normal    C/D Ratio 0.1 0.25   Macula Flat under gas; no detail view    Vessels Mild Vascular attenuation    Periphery grossly attached; poor view           IMAGING AND PROCEDURES  Imaging and Procedures for 12/18/17           ASSESSMENT/PLAN:    ICD-10-CM   1. Macular hole of right eye H35.341   2. 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 Retinal edema H35.81   4. Pseudophakia of both eyes Z96.1   5. History of repair of retinal tear by laser photocoagulation Z98.890     1. Full thickness macular hole, OD POW1 s/p 25g PPV/ICG/MP/EL/FAX/14% C3F8 OD, 2.14.19  - 70% gas bubble in AC today due to pt laying on back yesterday             - IOP good             - cont   PF 6x/day OD                         zymaxid QID OD-- stop when bottle runs out                         Atropine BID OD                         Cosopt BID OD                         PSO ung QID OD             - continue face down positioning; avoid laying flat on back             - eye shield when sleeping -- can d/c after POW2             - post op drop and positioning instructions reviewed             -  tylenol/ibuprofen for pain - f/u in 1 wk  2. Retinal tears OD-  - s/p laser retinopexy to inferotemporal tears on 2.12.19 - multiple additional tears noted intraoperatively and treated with endolaser on 2.14.19 - laser looks good  3. Retinal edema-  - pre-op: +CME surrounding macular hole  4. Pseudophakia OU  - s/p CE/IOL OU  - OD by Dr. Kathie RhodesS. Groat (09/2017) toric lens in good postion  - beautiful surgeries, doing well  - monitor  5. History of RT with laser retinopexy OS - large, jagged retinal tear superiorly OS with good laser surrounding - however she does have a lot of vitreous debris and floaters, which may be contributing to  her complaints of haze and glare following cataract surgery - BCVA OS is 20/20    Ophthalmic Meds Ordered this visit:  Meds ordered this encounter  Medications  . prednisoLONE acetate (PRED FORTE) 1 % ophthalmic suspension    Sig: Place 1 drop into the right eye 6 (six) times daily.    Dispense:  15 mL    Refill:  1  . bacitracin-polymyxin b (POLYSPORIN) ophthalmic ointment    Sig: Place into the right eye 4 (four) times daily for 10 days. Place a 1/2 inch ribbon of ointment into the lower eyelid.    Dispense:  3.5 g    Refill:  2       Return in about 11 days (around 12/29/2017) for POV 3; S/P 255g PPV/ICG/MP/EL/FAX/ 14% C3F8 OD (02.14.19).  There are no Patient Instructions on file for this visit.   Explained the diagnoses, plan, and follow up with the patient and they expressed understanding.  Patient expressed understanding of the importance of proper follow up care.   This document serves as a record of services personally performed by Karie Chimera, MD, PhD. It was created on their behalf by Virgilio Belling, COA, a certified ophthalmic assistant. The creation of this record is the provider's dictation and/or activities during the visit.  Electronically signed by: Virgilio Belling, COA  12/18/17 11:34 AM    Karie Chimera, M.D.,  Ph.D. Diseases & Surgery of the Retina and Vitreous [facility] 12/18/17   I have reviewed the above documentation for accuracy and completeness, and I agree with the above. Karie Chimera, M.D., Ph.D. 12/18/17 11:34 AM     Abbreviations: M myopia (nearsighted); A astigmatism; H hyperopia (farsighted); P presbyopia; Mrx spectacle prescription;  CTL contact lenses; OD right eye; OS left eye; OU both eyes  XT exotropia; ET esotropia; PEK punctate epithelial keratitis; PEE punctate epithelial erosions; DES dry eye syndrome; MGD meibomian gland dysfunction; ATs artificial tears; PFAT's preservative free artificial tears; [place] nuclear sclerotic cataract; PSC posterior subcapsular cataract; ERM epi-retinal membrane; PVD posterior vitreous detachment; RD retinal detachment; DM diabetes mellitus; DR diabetic retinopathy; NPDR non-proliferative diabetic retinopathy; PDR proliferative diabetic retinopathy; CSME clinically significant macular edema; DME diabetic macular edema; dbh dot blot hemorrhages; CWS cotton wool spot; POAG primary open angle glaucoma; C/D cup-to-disc ratio; HVF humphrey visual field; GVF goldmann visual field; OCT optical coherence tomography; IOP intraocular pressure; BRVO Branch retinal vein occlusion; CRVO central retinal vein occlusion; CRAO central retinal artery occlusion; BRAO branch retinal artery occlusion; RT retinal tear; SB scleral buckle; PPV pars plana vitrectomy; VH Vitreous hemorrhage; PRP panretinal laser photocoagulation; IVK intravitreal kenalog; VMT vitreomacular traction; MH Macular hole;  NVD neovascularization of the disc; NVE neovascularization elsewhere; AREDS age related eye disease study; ARMD age related macular degeneration; POAG primary open angle glaucoma; EBMD epithelial/anterior basement membrane dystrophy; ACIOL anterior chamber intraocular lens; IOL intraocular lens; PCIOL posterior chamber intraocular lens; Phaco/IOL  phacoemulsification with intraocular lens placement; PRK photorefractive keratectomy; LASIK laser assisted in situ keratomileusis; HTN hypertension; DM diabetes mellitus; COPD chronic obstructive pulmonary disease

## 2017-12-18 ENCOUNTER — Ambulatory Visit (INDEPENDENT_AMBULATORY_CARE_PROVIDER_SITE_OTHER): Payer: 59 | Admitting: Ophthalmology

## 2017-12-18 ENCOUNTER — Encounter (INDEPENDENT_AMBULATORY_CARE_PROVIDER_SITE_OTHER): Payer: Self-pay | Admitting: Ophthalmology

## 2017-12-18 DIAGNOSIS — Z961 Presence of intraocular lens: Secondary | ICD-10-CM

## 2017-12-18 DIAGNOSIS — Z9889 Other specified postprocedural states: Secondary | ICD-10-CM

## 2017-12-18 DIAGNOSIS — H3581 Retinal edema: Secondary | ICD-10-CM

## 2017-12-18 DIAGNOSIS — H33331 Multiple defects of retina without detachment, right eye: Secondary | ICD-10-CM

## 2017-12-18 DIAGNOSIS — H35341 Macular cyst, hole, or pseudohole, right eye: Secondary | ICD-10-CM

## 2017-12-18 MED ORDER — BACITRACIN-POLYMYXIN B 500-10000 UNIT/GM OP OINT: TOPICAL_OINTMENT | Freq: Four times a day (QID) | OPHTHALMIC | 2 refills | Status: AC

## 2017-12-18 MED ORDER — PREDNISOLONE ACETATE 1 % OP SUSP: 1.0000 [drp] | Freq: Every day | OPHTHALMIC | 1 refills | Status: DC

## 2017-12-18 NOTE — Progress Notes (Signed)
[facility] - Clinic Note  12/22/2017     CHIEF COMPLAINT Patient presents for Post-op Follow-up POW1 s/p 25g PPV/ICG/MP/EL/14% C3F8, OD, 2.14.19  HISTORY OF PRESENT ILLNESS: Sandra CockayneLaurie A Herring is a 57 y.o. female who presents to the clinic today for:   HPI    Post-op Follow-up    In right eye.  Discomfort includes itching.  Negative for pain, foreign body sensation, tearing, discharge, floaters and none.  Vision is stable.  I, the attending physician,  performed the HPI with the patient and updated documentation appropriately.          Comments    S/P 25g PPV/ICG/MP/EL/14% C3F8, OD (02.14.19); Pt states she has been faithful with post operative directions; pt states she she is able to see color but is unable to make out definition; Pt reports OD is very itchy and states she "pulls at lashes" to relive itching; Pt reports she is using all gtts as directed; Pt reports she has been faithful with face down positioning;       Last edited by Concepcion ElkFabian, Meredith C on 12/22/2017  1:13 PM. (History)    Pt states she is having pain; Pt states she is able to see a line where the bubble is; Pt states she laid on her back for 30-45 minutes yesterday;   Referring physician: Lindaann Herring, [place], PA-C [address] [place], [place] [id number]  HISTORICAL INFORMATION:   Selected notes from the MEDICAL RECORD NUMBER Referred by Dr. Laruth BouchardS. Groat for concern of VMT with macular hole OD;  LEE- 12.13.18 (S. Groat) [BCVA OD: 20/30+2 OS: 20/20] Ocular Hx- S/P [place] pseudophakia OU PMH- migraine, PTSD    CURRENT MEDICATIONS: Current Outpatient Medications (Ophthalmic Drugs)  Medication Sig  . bacitracin-polymyxin b (POLYSPORIN) ophthalmic ointment Place into the right eye 4 (four) times daily for 10 days. Place a 1/2 inch ribbon of ointment into the lower eyelid.  . prednisoLONE acetate (PRED FORTE) 1 % ophthalmic suspension Place 1 drop into the right eye  6 (six) times daily.   No current facility-administered medications for this visit.  (Ophthalmic Drugs)   Current Outpatient Medications (Other)  Medication Sig  . acetaminophen (TYLENOL) 500 MG tablet Take 1,000 mg by mouth daily as needed for moderate pain.   . Ascorbic Acid (VITAMIN C) 1000 MG tablet Take 4,000 mg by mouth daily.  . cyclobenzaprine (FLEXERIL) 10 MG tablet Take 10 mg by mouth at bedtime.  . diazepam (VALIUM) 5 MG tablet Take 5 mg by mouth at bedtime.   . famotidine (PEPCID) 20 MG tablet Take 20 mg by mouth daily as needed for heartburn.   Marland Kitchen. OVER THE COUNTER MEDICATION Take 1,500 mg by mouth daily. Cordyceps otc supplement  . OVER THE COUNTER MEDICATION Take 3 capsules by mouth daily. Bone up otc supplement  . pantoprazole (PROTONIX) 20 MG tablet Take 1 tablet (20 mg total) by mouth daily. (Patient taking differently: Take 20 mg by mouth daily as needed for heartburn or indigestion. )  . propranolol (INDERAL) 20 MG tablet Take 20 mg by mouth at bedtime.   . sertraline (ZOLOFT) 100 MG tablet Take 150 mg by mouth daily.   . sodium chloride (OCEAN) 0.65 % SOLN nasal spray Place 1 spray into both nostrils as needed for congestion.  . traMADol (ULTRAM) 50 MG tablet Take 50 mg by mouth daily as needed for moderate pain.   . traZODone (DESYREL) 100 MG tablet Take 50  mg by mouth at bedtime.  . Vitamin D-Vitamin K (VITAMIN K2-VITAMIN D3 PO) Take 2 tablets by mouth daily.   No current facility-administered medications for this visit.  (Other)      REVIEW OF SYSTEMS: ROS    Positive for: Eyes   Negative for: Constitutional, Gastrointestinal, Neurological, Skin, Genitourinary, Musculoskeletal, HENT, Endocrine, Cardiovascular, Respiratory, Psychiatric, Allergic/Imm, Heme/Lymph   Last edited by Concepcion Elk on 12/22/2017  1:00 PM. (History)       ALLERGIES Allergies  Allergen Reactions  . Serzone [Nefazodone] Shortness Of Breath    Breathing difficulty  . Nsaids Other  (See Comments)    gastritis    PAST MEDICAL HISTORY Past Medical History:  Diagnosis Date  . Anxiety   . Depression   . GERD (gastroesophageal reflux disease)   . Headache    migraines  . PTSD (post-traumatic stress disorder)   . PTSD (post-traumatic stress disorder)   . Seizures (HCC)    febrile seizure at age 82 months  . Thyroid nodule   . Vaginal infection    Past Surgical History:  Procedure Laterality Date  . 25 GAUGE PARS PLANA VITRECTOMY WITH 20 GAUGE MVR PORT FOR MACULAR HOLE Right 12/10/2017   Procedure: PARS PLANA VITRECTOMY WITH 25 GAUGE LAZER AND GAS MACULAR HOLE membrane pale gas;  Surgeon: Herring Chris, MD;  Location: [facility] OR;  Service: Ophthalmology;  Laterality: Right;  . COLONOSCOPY    . HERNIA REPAIR    . TONSILLECTOMY  2005  . TUBAL LIGATION      FAMILY HISTORY Family History  Problem Relation Age of Onset  . Cataracts Mother   . Cataracts Father   . Cataracts Maternal Grandfather   . Amblyopia Neg Hx   . Blindness Neg Hx   . Glaucoma Neg Hx   . Diabetes Neg Hx   . Macular degeneration Neg Hx   . Retinal detachment Neg Hx   . Strabismus Neg Hx   . Retinitis pigmentosa Neg Hx     SOCIAL HISTORY Social History   Tobacco Use  . Smoking status: Former Smoker    Packs/day: 0.25    Last attempt to quit: 10/20/2014    Years since quitting: 3.1  . Smokeless tobacco: Never Used  Substance Use Topics  . Alcohol use: Yes    Comment: occ  . Drug use: No         OPHTHALMIC EXAM:  Base Eye Exam    Visual Acuity (Snellen - Linear)      Right Left   Dist [place] CF @1 ' 20/20   Dist ph [place] NI        Tonometry (Tonopen, 1:11 PM)      Right Left   Pressure 18 20       Pupils      Dark Light Shape React APD   Right 6  Round Minimal None   Left 5 3 Round Brisk None       Neuro/Psych    Oriented x3:  Yes   Mood/Affect:  Normal       Dilation    Right eye:  1.0% Mydriacyl, 2.5% Phenylephrine @ 1:11 PM        Slit Lamp and Fundus Exam     Slit Lamp Exam      Right Left   Lids/Lashes Dermatochalasis - upper lid Dermatochalasis - upper lid   Conjunctiva/Sclera sutures intact; mild SCH and injection - improving White and quiet   Cornea trace Descemet's folds, central epi  defect healed 1+ Punctate epithelial erosions   Anterior Chamber 55% gas bubble in AC Deep and quiet   Iris Round and dilated, TIDs Round and dilated, Transillumination defects from 0600 to 1100   Lens toric PC IOL PC IOL in good postion with open PC   Vitreous Post vitrectomy; gas bubble 80% Posterior vitreous detachment, vitreous debris       Fundus Exam      Right Left   Disc Normal    C/D Ratio 0.1 0.25   Macula Grossly flat under gas; no detail view,     Vessels Mild Vascular attenuation    Periphery grossly attached; poor view           IMAGING AND PROCEDURES  Imaging and Procedures for 12/22/17           ASSESSMENT/PLAN:    ICD-10-CM   1. Macular hole of right eye H35.341   2. 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 Retinal edema H35.81   4. Pseudophakia of both eyes Z96.1   5. History of repair of retinal tear by laser photocoagulation Z98.890     1. Full thickness macular hole, OD POW2 s/p 25g PPV/ICG/MP/EL/FAX/14% C3F8 OD, 2.14.19  - 55% gas bubble in AC today --slightly improved             - IOP okay             - cont   PF 6x/day OD                         Atropine BID OD                         Cosopt BID OD                         PSO ung QID OD             - continue face down positioning; avoid laying flat on back             - eye shield when sleeping -- can d/c after POW2             - post op drop and positioning instructions reviewed             - tylenol/ibuprofen for pain - f/u on Friday - possible evacuation of gas in [facility] OD  2. Retinal tears OD-  - s/p laser retinopexy to inferotemporal tears on 2.12.19 - multiple additional tears noted intraoperatively and treated with  endolaser on 2.14.19 - unable to visualize laser today  3. Retinal edema-  - pre-op: +CME surrounding macular hole  4. Pseudophakia OU  - s/p CE/IOL OU  - OD by Dr. Kathie Rhodes. Groat (09/2017) toric lens in good postion  - beautiful surgeries, doing well  - monitor  5. History of RT with laser retinopexy OS - large, jagged retinal tear superiorly OS with good laser surrounding - however she does have a lot of vitreous debris and floaters, which may be contributing to her complaints of haze and glare following cataract surgery - BCVA OS is 20/20    Ophthalmic Meds Ordered this visit:  No orders of the defined types were placed in this encounter.      Return in 3 days (on 12/25/2017) for POV.  There are no Patient Instructions on file for this visit.  Explained the diagnoses, plan, and follow up with the patient and they expressed understanding.  Patient expressed understanding of the importance of proper follow up care.   This document serves as a record of services personally performed by Karie Chimera, MD, PhD. It was created on their behalf by Virgilio Belling, COA, a certified ophthalmic assistant. The creation of this record is the provider's dictation and/or activities during the visit.  Electronically signed by: Virgilio Belling, COA  12/22/17 1:32 PM   Karie Chimera, M.D., Ph.D. Diseases & Surgery of the Retina and Vitreous [facility] 12/22/17   I have reviewed the above documentation for accuracy and completeness, and I agree with the above. Karie Chimera, M.D., Ph.D. 12/22/17 1:34 PM     Abbreviations: M myopia (nearsighted); A astigmatism; H hyperopia (farsighted); P presbyopia; Mrx spectacle prescription;  CTL contact lenses; OD right eye; OS left eye; OU both eyes  XT exotropia; ET esotropia; PEK punctate epithelial keratitis; PEE punctate epithelial erosions; DES dry eye syndrome; MGD meibomian gland dysfunction; ATs artificial tears;  PFAT's preservative free artificial tears; NSC nuclear sclerotic cataract; PSC posterior subcapsular cataract; ERM epi-retinal membrane; PVD posterior vitreous detachment; RD retinal detachment; DM diabetes mellitus; DR diabetic retinopathy; NPDR non-proliferative diabetic retinopathy; PDR proliferative diabetic retinopathy; CSME clinically significant macular edema; DME diabetic macular edema; dbh dot blot hemorrhages; CWS cotton wool spot; POAG primary open angle glaucoma; C/D cup-to-disc ratio; HVF humphrey visual field; GVF goldmann visual field; OCT optical coherence tomography; IOP intraocular pressure; BRVO Branch retinal vein occlusion; CRVO central retinal vein occlusion; CRAO central retinal artery occlusion; BRAO branch retinal artery occlusion; RT retinal tear; SB scleral buckle; PPV pars plana vitrectomy; VH Vitreous hemorrhage; PRP panretinal laser photocoagulation; IVK intravitreal kenalog; VMT vitreomacular traction; MH Macular hole;  NVD neovascularization of the disc; NVE neovascularization elsewhere; AREDS age related eye disease study; ARMD age related macular degeneration; POAG primary open angle glaucoma; EBMD epithelial/anterior basement membrane dystrophy; ACIOL anterior chamber intraocular lens; IOL intraocular lens; PCIOL posterior chamber intraocular lens; Phaco/IOL phacoemulsification with intraocular lens placement; PRK photorefractive keratectomy; LASIK laser assisted in situ keratomileusis; HTN hypertension; DM diabetes mellitus; COPD chronic obstructive pulmonary disease

## 2017-12-22 ENCOUNTER — Ambulatory Visit (INDEPENDENT_AMBULATORY_CARE_PROVIDER_SITE_OTHER): Payer: 59 | Admitting: Ophthalmology

## 2017-12-22 ENCOUNTER — Encounter (INDEPENDENT_AMBULATORY_CARE_PROVIDER_SITE_OTHER): Payer: Self-pay | Admitting: Ophthalmology

## 2017-12-22 DIAGNOSIS — Z961 Presence of intraocular lens: Secondary | ICD-10-CM

## 2017-12-22 DIAGNOSIS — Z9889 Other specified postprocedural states: Secondary | ICD-10-CM

## 2017-12-22 DIAGNOSIS — H33331 Multiple defects of retina without detachment, right eye: Secondary | ICD-10-CM

## 2017-12-22 DIAGNOSIS — H35341 Macular cyst, hole, or pseudohole, right eye: Secondary | ICD-10-CM

## 2017-12-22 DIAGNOSIS — H3581 Retinal edema: Secondary | ICD-10-CM

## 2017-12-24 NOTE — Progress Notes (Signed)
[facility] Clinic Note  12/25/2017     CHIEF COMPLAINT Patient presents for Post-op Follow-up POW1 s/p 25g PPV/ICG/MP/EL/14% C3F8, OD, 2.14.19  HISTORY OF PRESENT ILLNESS: Sandra Herring is a 57 y.o. female who presents to the clinic today for:   HPI    Post-op Follow-up    In right eye.  Discomfort includes itching.  Negative for pain, foreign body sensation, tearing, discharge, floaters and none.  Vision is stable.  I, the attending physician,  performed the HPI with the patient and updated documentation appropriately.          Comments    S/P 25g PPV/ICG/MP/EL/14% C3F8 OD (02.14.19); Pt states OD VA remains unchanged; Pt states she is unable to see the bubble; Pt reports using PF 6x daily OD, atropine BID OD, cosopt OD BID, and PSO ung QID OD;        Last edited by Bernarda Caffey, MD on 12/25/2017 10:49 AM. (History)      Referring physician: Shanon Rosser, PA-C [place], [address] [address]  HISTORICAL INFORMATION:   Selected notes from the MEDICAL RECORD NUMBER Referred by Dr. 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 Groat) [BCVA OD: 20/30+2 OS: 20/20] Ocular Hx- S/P focal laser OS (2007 - [place]), pseudophakia OU PMH- migraine, PTSD    CURRENT MEDICATIONS: Current Outpatient Medications (Ophthalmic Drugs)  Medication Sig  . bacitracin-polymyxin b (POLYSPORIN) ophthalmic ointment Place into the right eye 4 (four) times daily for 10 days. Place a 1/2 inch ribbon of ointment into the lower eyelid.  . prednisoLONE acetate (PRED FORTE) 1 % ophthalmic suspension Place 1 drop into the right eye 6 (six) times daily.   No current facility-administered medications for this visit.  (Ophthalmic Drugs)   Current Outpatient Medications (Other)  Medication Sig  . acetaminophen (TYLENOL) 500 MG tablet Take 1,000 mg by mouth daily as needed for moderate pain.   . Ascorbic Acid (VITAMIN C) 1000 MG tablet  Take 4,000 mg by mouth daily.  . cyclobenzaprine (FLEXERIL) 10 MG tablet Take 10 mg by mouth at bedtime.  . diazepam (VALIUM) 5 MG tablet Take 5 mg by mouth at bedtime.   . famotidine (PEPCID) 20 MG tablet Take 20 mg by mouth daily as needed for heartburn.   Marland Kitchen OVER THE COUNTER MEDICATION Take 1,500 mg by mouth daily. Cordyceps otc supplement  . OVER THE COUNTER MEDICATION Take 3 capsules by mouth daily. Bone up otc supplement  . pantoprazole (PROTONIX) 20 MG tablet Take 1 tablet (20 mg total) by mouth daily. (Patient taking differently: Take 20 mg by mouth daily as needed for heartburn or indigestion. )  . propranolol (INDERAL) 20 MG tablet Take 20 mg by mouth at bedtime.   . sertraline (ZOLOFT) 100 MG tablet Take 150 mg by mouth daily.   . sodium chloride (OCEAN) 0.65 % SOLN nasal spray Place 1 spray into both nostrils as needed for congestion.  . traMADol (ULTRAM) 50 MG tablet Take 50 mg by mouth daily as needed for moderate pain.   . traZODone (DESYREL) 100 MG tablet Take 50 mg by mouth at bedtime.  . Vitamin D-Vitamin K (VITAMIN K2-VITAMIN D3 PO) Take 2 tablets by mouth daily.   No current facility-administered medications for this visit.  (Other)      REVIEW OF SYSTEMS: ROS    Positive for: Eyes   Negative for: Constitutional, Gastrointestinal, Neurological, Skin, Genitourinary, Musculoskeletal, HENT,  Endocrine, Cardiovascular, Respiratory, Psychiatric, Allergic/Imm, Heme/Lymph   Last edited by Cherrie Gauze on 12/25/2017 10:18 AM. (History)       ALLERGIES Allergies  Allergen Reactions  . Serzone [Nefazodone] Shortness Of Breath    Breathing difficulty  . Nsaids Other (See Comments)    gastritis    PAST MEDICAL HISTORY Past Medical History:  Diagnosis Date  . Anxiety   . Depression   . GERD (gastroesophageal reflux disease)   . Headache    migraines  . PTSD (post-traumatic stress disorder)   . PTSD (post-traumatic stress disorder)   . Seizures ([place])    febrile  seizure at age 64 months  . Thyroid nodule   . Vaginal infection    Past Surgical History:  Procedure Laterality Date  . [place] VITRECTOMY WITH 20 GAUGE MVR PORT FOR MACULAR HOLE Right 12/10/2017   Procedure: PARS PLANA VITRECTOMY WITH 25 GAUGE LAZER AND GAS MACULAR HOLE membrane pale gas;  Surgeon: Bernarda Caffey, MD;  Location: [facility];  Service: Ophthalmology;  Laterality: Right;  . COLONOSCOPY    . HERNIA REPAIR    . TONSILLECTOMY  2005  . TUBAL LIGATION      FAMILY HISTORY Family History  Problem Relation Age of Onset  . Cataracts Mother   . Cataracts Father   . Cataracts Maternal Grandfather   . Amblyopia Neg Hx   . Blindness Neg Hx   . Glaucoma Neg Hx   . Diabetes Neg Hx   . Macular degeneration Neg Hx   . Retinal detachment Neg Hx   . Strabismus Neg Hx   . Retinitis pigmentosa Neg Hx     SOCIAL HISTORY Social History   Tobacco Use  . Smoking status: Former Smoker    Packs/day: 0.25    Last attempt to quit: 10/20/2014    Years since quitting: 3.1  . Smokeless tobacco: Never Used  Substance Use Topics  . Alcohol use: Yes    Comment: occ  . Drug use: No         OPHTHALMIC EXAM:  Base Eye Exam    Visual Acuity (Snellen - Linear)      Right Left   Dist [place] CF @ 1' 20/20   Dist ph [place] NI NI       Tonometry (Tonopen, 10:26 AM)      Right Left   Pressure 17 18       Pupils      Dark Light Shape React APD   Right 6  Round Minimal None   Left 4 3 Round Brisk None       Neuro/Psych    Oriented x3:  Yes   Mood/Affect:  Normal       Dilation    Right eye:  1.0% Mydriacyl, 2.5% Phenylephrine @ 10:27 AM        Slit Lamp and Fundus Exam    Slit Lamp Exam      Right Left   Lids/Lashes Dermatochalasis - upper lid Dermatochalasis - upper lid   Conjunctiva/Sclera sutures intact; mild [place] and injection - improving White and quiet   Cornea 1+ Descemet's folds 1+ Punctate epithelial erosions   Anterior Chamber 60% gas bubble in AC Deep and  quiet   Iris Round and dilated, TIDs Round and dilated, Transillumination defects from 0600 to 1100   Lens toric PC IOL PC IOL in good postion with open PC   Vitreous Post vitrectomy; gas bubble 80% Posterior vitreous detachment, vitreous debris  Fundus Exam      Right Left   Disc Normal    C/D Ratio 0.1 0.25   Macula flat under gas; mac hole closing    Vessels Mild Vascular attenuation    Periphery attached; good laser 360           IMAGING AND PROCEDURES  Imaging and Procedures for 12/26/17           ASSESSMENT/PLAN:    ICD-10-CM   1. Macular hole of right eye H35.341   2. 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 Retinal edema H35.81   4. Pseudophakia of both eyes Z96.1   5. History of repair of retinal tear by laser photocoagulation Z98.890   6. 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 Pseudophakia of left eye Z96.1     1. Full thickness macular hole, OD POW2 s/p 25g PPV/ICG/MP/EL/FAX/14% C3F8 OD, 2.14.19  - 55% gas bubble in AC today -- stable, but with mild corneal edema / descemet folds  - recommend AC tap to remove gas bubble from [facility]   - RBA of procedure discussed, questions answered   - informed consent obtained    - gas bubble removed in slit lamp with 30g needle   - no complications, pt tolerated well   - 5-10% residual gas bubble in AC   - betadine and polymixin drops given pre and post procedure             - IOP okay             - cont   PF 6x/day OD                         Atropine BID OD                         Cosopt BID OD                         PSO ung QID OD             - continue face down positioning 50% of time; avoid laying flat on back             - eye shield when sleeping -- can d/c after POW2             - post op drop and positioning instructions reviewed             - tylenol/ibuprofen for pain - f/u Wednesday  2. Retinal tears OD-  - s/p laser retinopexy to  inferotemporal tears on 2.12.19 - multiple additional tears noted intraoperatively and treated with endolaser on 2.14.19 - unable to visualize laser today  3. Retinal edema-  - pre-op: +CME surrounding macular hole  4. Pseudophakia OU  - s/p CE/IOL OU  - OD by Dr. Chauncey Cruel. Groat (09/2017) toric lens in good postion  - beautiful surgeries, doing well  - monitor  5. History of RT with laser retinopexy OS - large, jagged retinal tear superiorly OS with good laser surrounding - however she does have a lot of vitreous debris and floaters, which may be contributing to her complaints of haze and glare following cataract surgery - BCVA OS is 20/20    Ophthalmic Meds Ordered this visit:  No orders of the defined types were placed in this encounter.  Return in about 5 days (around 12/30/2017) for POV.  There are no Patient Instructions on file for this visit.   Explained the diagnoses, plan, and follow up with the patient and they expressed understanding.  Patient expressed understanding of the importance of proper follow up care.   This document serves as a record of services personally performed by Gardiner Sleeper, MD, PhD. It was created on their behalf by Catha Brow, [place], a certified ophthalmic assistant. The creation of this record is the provider's dictation and/or activities during the visit.  Electronically signed by: Catha Brow, [place]  12/26/17 1:32 PM   Gardiner Sleeper, M.D., Ph.D. Diseases & Surgery of the Retina and [place] 12/26/17  I have reviewed the above documentation for accuracy and completeness, and I agree with the above. Gardiner Sleeper, M.D., Ph.D. 12/26/17 1:33 PM     Abbreviations: M myopia (nearsighted); A astigmatism; H hyperopia (farsighted); P presbyopia; Mrx spectacle prescription;  CTL contact lenses; OD right eye; OS left eye; OU both eyes  XT exotropia; ET esotropia; PEK punctate epithelial keratitis; PEE  punctate epithelial erosions; DES dry eye syndrome; MGD meibomian gland dysfunction; ATs artificial tears; PFAT's preservative free artificial tears; [place] nuclear sclerotic cataract; PSC posterior subcapsular cataract; ERM epi-retinal membrane; PVD posterior vitreous detachment; RD retinal detachment; DM diabetes mellitus; DR diabetic retinopathy; NPDR non-proliferative diabetic retinopathy; PDR proliferative diabetic retinopathy; CSME clinically significant macular edema; DME diabetic macular edema; dbh dot blot hemorrhages; CWS cotton wool spot; POAG primary open angle glaucoma; C/D cup-to-disc ratio; HVF humphrey visual field; GVF goldmann visual field; OCT optical coherence tomography; IOP intraocular pressure; BRVO Branch retinal vein occlusion; CRVO central retinal vein occlusion; CRAO central retinal artery occlusion; BRAO branch retinal artery occlusion; RT retinal tear; SB scleral buckle; PPV pars plana vitrectomy; VH Vitreous hemorrhage; PRP panretinal laser photocoagulation; IVK intravitreal kenalog; VMT vitreomacular traction; MH Macular hole;  NVD neovascularization of the disc; NVE neovascularization elsewhere; AREDS age related eye disease study; ARMD age related macular degeneration; POAG primary open angle glaucoma; EBMD epithelial/anterior basement membrane dystrophy; ACIOL anterior chamber intraocular lens; IOL intraocular lens; PCIOL posterior chamber intraocular lens; Phaco/IOL phacoemulsification with intraocular lens placement; [place] photorefractive keratectomy; LASIK laser assisted in situ keratomileusis; HTN hypertension; DM diabetes mellitus; COPD chronic obstructive pulmonary disease

## 2017-12-25 ENCOUNTER — Ambulatory Visit (INDEPENDENT_AMBULATORY_CARE_PROVIDER_SITE_OTHER): Payer: 59 | Admitting: Ophthalmology

## 2017-12-25 ENCOUNTER — Encounter (INDEPENDENT_AMBULATORY_CARE_PROVIDER_SITE_OTHER): Payer: Self-pay | Admitting: Ophthalmology

## 2017-12-25 DIAGNOSIS — H35341 Macular cyst, hole, or pseudohole, right eye: Secondary | ICD-10-CM

## 2017-12-25 DIAGNOSIS — H25811 Combined forms of age-related cataract, right eye: Secondary | ICD-10-CM

## 2017-12-25 DIAGNOSIS — Z9889 Other specified postprocedural states: Secondary | ICD-10-CM

## 2017-12-25 DIAGNOSIS — H3581 Retinal edema: Secondary | ICD-10-CM

## 2017-12-25 DIAGNOSIS — H33331 Multiple defects of retina without detachment, right eye: Secondary | ICD-10-CM

## 2017-12-25 DIAGNOSIS — Z961 Presence of intraocular lens: Secondary | ICD-10-CM

## 2017-12-26 ENCOUNTER — Encounter (INDEPENDENT_AMBULATORY_CARE_PROVIDER_SITE_OTHER): Payer: Self-pay | Admitting: Ophthalmology

## 2017-12-28 NOTE — Progress Notes (Signed)
[facility] Note  12/30/2017     CHIEF COMPLAINT Patient presents for Post-op Follow-up POW1 s/p 25g PPV/ICG/MP/EL/14% C3F8, OD, 2.14.19  HISTORY OF PRESENT ILLNESS: ARIYAN Herring is a 57 y.o. female who presents to the clinic today for:   HPI    Post-op Follow-up    In right eye.  Discomfort includes pain, itching and tearing.  Negative for foreign body sensation, discharge and floaters.  I, the attending physician,  performed the HPI with the patient and updated documentation appropriately.          Comments    S/P 25g PPV/ICG/MP/EL/FAX/14% C3F8 OD (12/10/17). Patient states her vision is about the same, occasional tearing and itching Od. Pt reports "she does not see a gas bubble but, she has a line above visual field " Pt states "her vision is like looking thru a glass of water". Pt is using Polymixin QID OD, Pred Forte 6x QD OD, Atropine BID OD, CoSopt Bid OD.        Last edited by Bernarda Caffey, MD on 12/30/2017 12:04 PM. (History)      Referring physician: Shanon [place]er, PA-C [place], [place] [address]  HISTORICAL INFORMATION:   Selected notes from the MEDICAL RECORD NUMBER Referred by Dr. 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 Groat) [BCVA OD: 20/30+2 OS: 20/20] Ocular Hx- S/P focal laser OS (2007 - [place]), pseudophakia OU PMH- migraine, PTSD    CURRENT MEDICATIONS: Current Outpatient Medications (Ophthalmic Drugs)  Medication Sig  . prednisoLONE acetate (PRED FORTE) 1 % ophthalmic suspension Place 1 drop into the right eye 6 (six) times daily.   No current facility-administered medications for this visit.  (Ophthalmic Drugs)   Current Outpatient Medications (Other)  Medication Sig  . acetaminophen (TYLENOL) 500 MG tablet Take 1,000 mg by mouth daily as needed for moderate pain.   . Ascorbic Acid (VITAMIN C) 1000 MG tablet Take 4,000 mg by mouth daily.  . cyclobenzaprine  (FLEXERIL) 10 MG tablet Take 10 mg by mouth at bedtime.  . diazepam (VALIUM) 5 MG tablet Take 5 mg by mouth at bedtime.   . famotidine (PEPCID) 20 MG tablet Take 20 mg by mouth daily as needed for heartburn.   Marland Kitchen OVER THE COUNTER MEDICATION Take 1,500 mg by mouth daily. Cordyceps otc supplement  . OVER THE COUNTER MEDICATION Take 3 capsules by mouth daily. Bone up otc supplement  . pantoprazole (PROTONIX) 20 MG tablet Take 1 tablet (20 mg total) by mouth daily. (Patient taking differently: Take 20 mg by mouth daily as needed for heartburn or indigestion. )  . propranolol (INDERAL) 20 MG tablet Take 20 mg by mouth at bedtime.   . sertraline (ZOLOFT) 100 MG tablet Take 150 mg by mouth daily.   . sodium chloride (OCEAN) 0.65 % SOLN nasal spray Place 1 spray into both nostrils as needed for congestion.  . traMADol (ULTRAM) 50 MG tablet Take 50 mg by mouth daily as needed for moderate pain.   . traZODone (DESYREL) 100 MG tablet Take 50 mg by mouth at bedtime.  . Vitamin D-Vitamin K (VITAMIN K2-VITAMIN D3 PO) Take 2 tablets by mouth daily.   No current facility-administered medications for this visit.  (Other)      REVIEW OF SYSTEMS: ROS    Positive for: Eyes   Negative for: Constitutional, Gastrointestinal, Neurological, Skin, Genitourinary, Musculoskeletal, HENT, Endocrine, Cardiovascular, Respiratory, Psychiatric, Allergic/Imm, Heme/Lymph  Last edited by Zenovia Jordan, LPN on 04/02/1244 80:99 AM. (History)       ALLERGIES Allergies  Allergen Reactions  . Serzone [Nefazodone] Shortness Of Breath    Breathing difficulty  . Nsaids Other (See Comments)    gastritis    PAST MEDICAL HISTORY Past Medical History:  Diagnosis Date  . Anxiety   . Depression   . GERD (gastroesophageal reflux disease)   . Headache    migraines  . PTSD (post-traumatic stress disorder)   . PTSD (post-traumatic stress disorder)   . Seizures ([place])    febrile seizure at age 22 months  . Thyroid nodule    . Vaginal infection    Past Surgical History:  Procedure Laterality Date  . [place] VITRECTOMY WITH 20 GAUGE MVR PORT FOR MACULAR HOLE Right 12/10/2017   Procedure: PARS PLANA VITRECTOMY WITH 25 GAUGE LAZER AND GAS MACULAR HOLE membrane pale gas;  Surgeon: Bernarda Caffey, MD;  Location: [facility];  Service: Ophthalmology;  Laterality: Right;  . COLONOSCOPY    . HERNIA REPAIR    . TONSILLECTOMY  2005  . TUBAL LIGATION      FAMILY HISTORY Family History  Problem Relation Age of Onset  . Cataracts Mother   . Cataracts Father   . Cataracts Maternal Grandfather   . Amblyopia Neg Hx   . Blindness Neg Hx   . Glaucoma Neg Hx   . Diabetes Neg Hx   . Macular degeneration Neg Hx   . Retinal detachment Neg Hx   . Strabismus Neg Hx   . Retinitis pigmentosa Neg Hx     SOCIAL HISTORY Social History   Tobacco Use  . Smoking status: Former Smoker    Packs/day: 0.25    Last attempt to quit: 10/20/2014    Years since quitting: 3.2  . Smokeless tobacco: Never Used  Substance Use Topics  . Alcohol use: Yes    Comment: occ  . Drug use: No         OPHTHALMIC EXAM:  Base Eye Exam    Visual Acuity (Snellen - Linear)      Right Left   Dist [place] CF@1  20/25 +2   Dist ph [place] NI 20/20       Tonometry (Tonopen, 10:41 AM)      Right Left   Pressure 17 18       Pupils      Dark Light Shape React APD   Right 6  Round Minimal None   Left 4 3 Round Brisk None       Neuro/Psych    Oriented x3:  Yes   Mood/Affect:  Normal       Dilation    Right eye:  1.0% Mydriacyl, [place] @ 10:41 AM        Slit Lamp and Fundus Exam    Slit Lamp Exam      Right Left   Lids/Lashes Dermatochalasis - upper lid Dermatochalasis - upper lid   Conjunctiva/Sclera sutures intact; mild [place] and injection - improving White and quiet   Cornea Trace central Descemet's folds, 1+ diffuse Punctate epithelial erosions, endo pigment centrally  1+ Punctate epithelial erosions   Anterior Chamber 10% gas  bubble in AC Deep and quiet   Iris Round and dilated, TIDs Round and dilated, Transillumination defects from 0600 to 1100   Lens toric PC IOL with marks at 0615, mild superior displacement, pigment on IOL PC IOL in good postion with open PC   Vitreous  Post vitrectomy; gas bubble 75-80% Posterior vitreous detachment, vitreous debris       Fundus Exam      Right Left   Disc Normal    C/D Ratio 0.1 0.25   Macula flat under gas; mac hole closing    Vessels Mild Vascular attenuation    Periphery attached; good laser 360           IMAGING AND PROCEDURES  Imaging and Procedures for 12/31/17           ASSESSMENT/PLAN:    ICD-10-CM   1. Macular hole of right eye H35.341   2. 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 Retinal edema H35.81 CANCELED: OCT, Retina - OU - Both Eyes  4. Pseudophakia of both eyes Z96.1   5. History of repair of retinal tear by laser photocoagulation Z98.890   6. 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 Pseudophakia of left eye Z96.1     1. Full thickness macular hole, OD POW3 s/p 25g PPV/ICG/MP/EL/FAX/14% C3F8 OD, 2.14.19  - 10% gas bubble in AC today -- stable, but with mild corneal edema / descemet folds  - recommend repeat AC tap to remove gas bubble retained in [facility] today   - RBA of procedure discussed, questions answered   - informed consent obtained and signed   - residual gas in AC <5% post procedure             - IOP okay             - cont   PF 6x/day OD                         Atropine BID OD                         Cosopt BID OD                         PSO ung QID OD             - continue face down positioning 50% of time; avoid laying flat on back             - eye shield when sleeping -- can d/c after POW2             - post op drop and positioning instructions reviewed             - tylenol/ibuprofen for pain - f/u 7-10 days w/ OCT  2. Retinal tears OD-  - s/p laser retinopexy to  inferotemporal tears on 2.12.19 - multiple additional tears noted intraoperatively and treated with endolaser on 2.14.19 - laser looks good  3. Retinal edema-  - pre-op: +CME surrounding macular hole  4. Pseudophakia OU  - s/p CE/IOL OU  - OD by Dr. Chauncey Cruel. [place] (09/2017) toric lens in good postion  - beautiful surgeries, doing well  - monitor  5. History of RT with laser retinopexy OS - large, jagged retinal tear superiorly OS with good laser surrounding - however she does have a lot of vitreous debris and floaters, which may be contributing to her complaints of haze and glare following cataract surgery - BCVA OS is 20/20  - stable   Ophthalmic Meds Ordered this visit:  No orders of the defined types were placed in this encounter.  Return in about 12 days (around 01/11/2018) for S/P PPV OD (02.14.19).  There are no Patient Instructions on file for this visit.   Explained the diagnoses, plan, and follow up with the patient and they expressed understanding.  Patient expressed understanding of the importance of proper follow up care.   This document serves as a record of services personally performed by Gardiner Sleeper, MD, PhD. It was created on their behalf by Catha Brow, [place], a certified ophthalmic assistant. The creation of this record is the provider's dictation and/or activities during the visit.  Electronically signed by: Catha Brow, [place]  12/31/17 10:30 PM   Gardiner Sleeper, M.D., Ph.D. Diseases & Surgery of the Retina and [place] 12/31/17  I have reviewed the above documentation for accuracy and completeness, and I agree with the above. Gardiner Sleeper, M.D., Ph.D. 12/31/17 10:30 PM    Abbreviations: M myopia (nearsighted); A astigmatism; H hyperopia (farsighted); P presbyopia; Mrx spectacle prescription;  CTL contact lenses; OD right eye; OS left eye; OU both eyes  XT exotropia; ET esotropia; PEK punctate epithelial  keratitis; PEE punctate epithelial erosions; DES dry eye syndrome; MGD meibomian gland dysfunction; ATs artificial tears; PFAT's preservative free artificial tears; [place] nuclear sclerotic cataract; PSC posterior subcapsular cataract; ERM epi-retinal membrane; PVD posterior vitreous detachment; RD retinal detachment; DM diabetes mellitus; DR diabetic retinopathy; NPDR non-proliferative diabetic retinopathy; PDR proliferative diabetic retinopathy; CSME clinically significant macular edema; DME diabetic macular edema; dbh dot blot hemorrhages; CWS cotton wool spot; POAG primary open angle glaucoma; C/D cup-to-disc ratio; HVF humphrey visual field; GVF goldmann visual field; OCT optical coherence tomography; IOP intraocular pressure; BRVO Branch retinal vein occlusion; CRVO central retinal vein occlusion; CRAO central retinal artery occlusion; BRAO branch retinal artery occlusion; RT retinal tear; SB scleral buckle; PPV pars plana vitrectomy; VH Vitreous hemorrhage; PRP panretinal laser photocoagulation; IVK intravitreal kenalog; VMT vitreomacular traction; MH Macular hole;  NVD neovascularization of the disc; NVE neovascularization elsewhere; AREDS age related eye disease study; ARMD age related macular degeneration; POAG primary open angle glaucoma; EBMD epithelial/anterior basement membrane dystrophy; ACIOL anterior chamber intraocular lens; IOL intraocular lens; PCIOL posterior chamber intraocular lens; Phaco/IOL phacoemulsification with intraocular lens placement; [place] photorefractive keratectomy; LASIK laser assisted in situ keratomileusis; HTN hypertension; DM diabetes mellitus; COPD chronic obstructive pulmonary disease

## 2017-12-30 ENCOUNTER — Encounter (INDEPENDENT_AMBULATORY_CARE_PROVIDER_SITE_OTHER): Payer: Self-pay | Admitting: Ophthalmology

## 2017-12-30 ENCOUNTER — Ambulatory Visit (INDEPENDENT_AMBULATORY_CARE_PROVIDER_SITE_OTHER): Payer: 59 | Admitting: Ophthalmology

## 2017-12-30 DIAGNOSIS — H25811 Combined forms of age-related cataract, right eye: Secondary | ICD-10-CM

## 2017-12-30 DIAGNOSIS — Z9889 Other specified postprocedural states: Secondary | ICD-10-CM

## 2017-12-30 DIAGNOSIS — Z961 Presence of intraocular lens: Secondary | ICD-10-CM

## 2017-12-30 DIAGNOSIS — H35341 Macular cyst, hole, or pseudohole, right eye: Secondary | ICD-10-CM

## 2017-12-30 DIAGNOSIS — H33331 Multiple defects of retina without detachment, right eye: Secondary | ICD-10-CM

## 2017-12-30 DIAGNOSIS — H3581 Retinal edema: Secondary | ICD-10-CM

## 2017-12-31 ENCOUNTER — Encounter (INDEPENDENT_AMBULATORY_CARE_PROVIDER_SITE_OTHER): Payer: Self-pay | Admitting: Ophthalmology

## 2018-01-12 NOTE — Progress Notes (Signed)
[facility] Note  01/13/2018     CHIEF COMPLAINT Patient presents for Post-op Follow-up POW1 s/p 25g PPV/ICG/MP/EL/14% C3F8, OD, 2.14.19  HISTORY OF PRESENT ILLNESS: Sandra Herring is a 57 y.o. female who presents to the clinic today for:   HPI    Post-op Follow-up    In right eye.  Discomfort includes none.  Negative for pain, itching, foreign body sensation, tearing, discharge and floaters.  Vision is stable.  I, the attending physician,  performed the HPI with the patient and updated documentation appropriately.          Comments    57 y/o female pt here for post op s/p 25g PPV/ICG/MP/EL/FAX/14% C3F8 OD on 2.14.19.  Vision OD very blurred and pt is very photophobic od.  OD stays dilated due to use of atropine.  No change in vision os.  Denies pain, flashes, floaters OU.  Ocassional headaches.  Needs more atropine.  Atropine bid od "[place] gtt" bid od Pred gtts six times daily od "Cream" six times daily od       Last edited by Bernarda Caffey, MD on 01/13/2018 11:51 PM. (History)      Referring physician: Shanon [place]er, PA-C [place], [place] [phone]  HISTORICAL INFORMATION:   Selected notes from the MEDICAL RECORD NUMBER Referred by Dr. 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 Groat) [BCVA OD: 20/30+2 OS: 20/20] Ocular Hx- S/P focal laser OS (2007 - [place]), pseudophakia OU PMH- migraine, PTSD    CURRENT MEDICATIONS: Current Outpatient Medications (Ophthalmic Drugs)  Medication Sig  . prednisoLONE acetate (PRED FORTE) 1 % ophthalmic suspension Place 1 drop into the right eye 6 (six) times daily.   No current facility-administered medications for this visit.  (Ophthalmic Drugs)   Current Outpatient Medications (Other)  Medication Sig  . acetaminophen (TYLENOL) 500 MG tablet Take 1,000 mg by mouth daily as needed for moderate pain.   . Ascorbic Acid (VITAMIN C) 1000 MG tablet  Take 4,000 mg by mouth daily.  . cyclobenzaprine (FLEXERIL) 10 MG tablet Take 10 mg by mouth at bedtime.  . diazepam (VALIUM) 5 MG tablet Take 5 mg by mouth at bedtime.   . famotidine (PEPCID) 20 MG tablet Take 20 mg by mouth daily as needed for heartburn.   Marland Kitchen OVER THE COUNTER MEDICATION Take 1,500 mg by mouth daily. Cordyceps otc supplement  . OVER THE COUNTER MEDICATION Take 3 capsules by mouth daily. Bone up otc supplement  . pantoprazole (PROTONIX) 20 MG tablet Take 1 tablet (20 mg total) by mouth daily. (Patient taking differently: Take 20 mg by mouth daily as needed for heartburn or indigestion. )  . propranolol (INDERAL) 20 MG tablet Take 20 mg by mouth at bedtime.   . sertraline (ZOLOFT) 100 MG tablet Take 150 mg by mouth daily.   . sodium chloride (OCEAN) 0.65 % SOLN nasal spray Place 1 spray into both nostrils as needed for congestion.  . traMADol (ULTRAM) 50 MG tablet Take 50 mg by mouth daily as needed for moderate pain.   . traZODone (DESYREL) 100 MG tablet Take 50 mg by mouth at bedtime.  . Vitamin D-Vitamin K (VITAMIN K2-VITAMIN D3 PO) Take 2 tablets by mouth daily.   No current facility-administered medications for this visit.  (Other)      REVIEW OF SYSTEMS: ROS    Positive for: Eyes   Negative for: Constitutional, Gastrointestinal, Neurological,  Skin, Genitourinary, Musculoskeletal, HENT, Endocrine, Cardiovascular, Respiratory, Psychiatric, Allergic/Imm, Heme/Lymph   Last edited by Matthew Folks, COA on 01/13/2018 10:24 AM. (History)       ALLERGIES Allergies  Allergen Reactions  . Serzone [Nefazodone] Shortness Of Breath    Breathing difficulty  . Nsaids Other (See Comments)    gastritis    PAST MEDICAL HISTORY Past Medical History:  Diagnosis Date  . Anxiety   . Depression   . GERD (gastroesophageal reflux disease)   . Headache    migraines  . PTSD (post-traumatic stress disorder)   . PTSD (post-traumatic stress disorder)   . Seizures ([place])     febrile seizure at age 12 months  . Thyroid nodule   . Vaginal infection    Past Surgical History:  Procedure Laterality Date  . [place] VITRECTOMY WITH 20 GAUGE MVR PORT FOR MACULAR HOLE Right 12/10/2017   Procedure: PARS PLANA VITRECTOMY WITH 25 GAUGE LAZER AND GAS MACULAR HOLE membrane pale gas;  Surgeon: Bernarda Caffey, MD;  Location: [facility];  Service: Ophthalmology;  Laterality: Right;  . COLONOSCOPY    . HERNIA REPAIR    . TONSILLECTOMY  2005  . TUBAL LIGATION      FAMILY HISTORY Family History  Problem Relation Age of Onset  . Cataracts Mother   . Cataracts Father   . Cataracts Maternal Grandfather   . Amblyopia Neg Hx   . Blindness Neg Hx   . Glaucoma Neg Hx   . Diabetes Neg Hx   . Macular degeneration Neg Hx   . Retinal detachment Neg Hx   . Strabismus Neg Hx   . Retinitis pigmentosa Neg Hx     SOCIAL HISTORY Social History   Tobacco Use  . Smoking status: Former Smoker    Packs/day: 0.25    Last attempt to quit: 10/20/2014    Years since quitting: 3.2  . Smokeless tobacco: Never Used  Substance Use Topics  . Alcohol use: Yes    Comment: occ  . Drug use: No         OPHTHALMIC EXAM:  Base Eye Exam    Visual Acuity (Snellen - Linear)      Right Left   Dist [place] 20/300 20/25 -2   Dist ph [place] NI 20/25 -1       Tonometry (Tonopen, 10:22 AM)      Right Left   Pressure 22 21       Pupils      Dark Light Shape React APD   Right        Left 3 2 Round Brisk None  OD dilated       Visual Fields (Counting fingers)      Left Right    Full    Restrictions  Partial outer inferior temporal, inferior nasal deficiencies       Extraocular Movement      Right Left    Full Full       Neuro/Psych    Oriented x3:  Yes   Mood/Affect:  Normal       Dilation    Both eyes:  1.0% Mydriacyl, 2.5% Phenylephrine @ 10:22 AM        Slit Lamp and Fundus Exam    Slit Lamp Exam      Right Left   Lids/Lashes Dermatochalasis - upper lid  Dermatochalasis - upper lid   Conjunctiva/Sclera sutures intact; mild [place] and injection - resolved White and quiet   Cornea 1-2+  diffuse Punctate epithelial erosions 1+ Punctate epithelial erosions   Anterior Chamber Deep and quiet Deep and quiet   Iris Round and dilated, TIDs, ST Posterior synechiae, Irregular pupil Round and dilated, Transillumination defects from 0600 to 1100   Lens toric PC IOL with marks at 0615, mild superior displacement, pigment on IOL PC IOL in good postion with open PC   Vitreous Post vitrectomy; gas bubble 40-45% Posterior vitreous detachment, vitreous debris, Weiss ring       Fundus Exam      Right Left   Disc Normal Normal   C/D Ratio 0.1 0.25   Macula flat under gas; mac hole closed, Blunted foveal reflex flat; mild ERM; no heme or edema   Vessels Mild Vascular attenuation Mild Vascular attenuation   Periphery attached; good laser 360 ragged [place] tear spanning from 1130 to 0130 with good surrounding laser scars, round area of chorioretinal atrophy at 0700          IMAGING AND PROCEDURES  Imaging and Procedures for 01/13/18  OCT, Retina - OU - Both Eyes     Right Eye Quality was good. Central Foveal Thickness: 348. Progression has improved. Findings include abnormal foveal contour, no SRF, no IRF.   Left Eye Quality was good. Central Foveal Thickness: 277. Progression has been stable. Findings include normal foveal contour, no IRF, no SRF, epiretinal membrane.   Notes *Images captured and stored on drive  Diagnosis / Impression:  OD: FTMH closed OS: mild ERM  Clinical management:  See below  Abbreviations: NFP - Normal foveal profile. CME - cystoid macular edema. PED - pigment epithelial detachment. IRF - intraretinal fluid. SRF - subretinal fluid. EZ - ellipsoid zone. ERM - epiretinal membrane. ORA - outer retinal atrophy. ORT - outer retinal tubulation. SRHM - subretinal hyper-reflective material                   ASSESSMENT/PLAN:    ICD-10-CM   1. Macular hole of right eye H35.341 OCT, Retina - OU - Both Eyes  2. 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 Retinal edema H35.81   4. Pseudophakia of both eyes Z96.1   5. History of repair of retinal tear by laser photocoagulation Z98.890   6. 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 Pseudophakia of left eye Z96.1     1. Full thickness macular hole, OD POW5 s/p 25g PPV/ICG/MP/EL/FAX/14% C3F8 OD, 2.14.19  - s/p removal of gas bubble retained in AC, 03.06.19             - IOP slightly elevated today             - cont   PF QID OD                         Atropine BID OD - when bottle runs out able to D/C                         Cosopt BID OD                         PSO ung QID OD             - can d/c face down positioning; avoid laying flat on back             - post op drop and  positioning instructions reviewed             - tylenol/ibuprofen for pain - f/u 4 weeks  2. Retinal tears OD-  - s/p laser retinopexy to inferotemporal tears on 2.12.19 - multiple additional tears noted intraoperatively and treated with endolaser on 2.14.19 - laser looks good  3. Retinal edema-  - pre-op: +CME surrounding macular hole  4. Pseudophakia OU  - s/p CE/IOL OU  - OD by Dr. Chauncey Cruel. [place] (09/2017) toric lens in good postion  - beautiful surgeries, doing well  - monitor  5. History of RT with laser retinopexy OS - large, jagged retinal tear superiorly OS with good laser surrounding - however she does have a lot of vitreous debris and floaters, which may be contributing to her complaints of haze and glare following cataract surgery - BCVA OS is 20/20  - stable   Ophthalmic Meds Ordered this visit:  No orders of the defined types were placed in this encounter.      Return in about 4 weeks (around 02/10/2018) for POV.  There are no Patient Instructions on file for this visit.   Explained the  diagnoses, plan, and follow up with the patient and they expressed understanding.  Patient expressed understanding of the importance of proper follow up care.   This document serves as a record of services personally performed by Gardiner Sleeper, MD, PhD. It was created on their behalf by Catha Brow, [place], a certified ophthalmic assistant. The creation of this record is the provider's dictation and/or activities during the visit.  Electronically signed by: Catha Brow, [place]  01/13/18 11:52 PM   Gardiner Sleeper, M.D., Ph.D. Diseases & Surgery of the Retina and [place] 01/13/18  I have reviewed the above documentation for accuracy and completeness, and I agree with the above. Gardiner Sleeper, M.D., Ph.D. 01/13/18 11:54 PM    Abbreviations: M myopia (nearsighted); A astigmatism; H hyperopia (farsighted); P presbyopia; Mrx spectacle prescription;  CTL contact lenses; OD right eye; OS left eye; OU both eyes  XT exotropia; ET esotropia; PEK punctate epithelial keratitis; PEE punctate epithelial erosions; DES dry eye syndrome; MGD meibomian gland dysfunction; ATs artificial tears; PFAT's preservative free artificial tears; [place] nuclear sclerotic cataract; PSC posterior subcapsular cataract; ERM epi-retinal membrane; PVD posterior vitreous detachment; RD retinal detachment; DM diabetes mellitus; DR diabetic retinopathy; NPDR non-proliferative diabetic retinopathy; PDR proliferative diabetic retinopathy; CSME clinically significant macular edema; DME diabetic macular edema; dbh dot blot hemorrhages; CWS cotton wool spot; POAG primary open angle glaucoma; C/D cup-to-disc ratio; HVF humphrey visual field; GVF goldmann visual field; OCT optical coherence tomography; IOP intraocular pressure; BRVO Branch retinal vein occlusion; CRVO central retinal vein occlusion; CRAO central retinal artery occlusion; BRAO branch retinal artery occlusion; RT retinal tear; SB scleral  buckle; PPV pars plana vitrectomy; VH Vitreous hemorrhage; PRP panretinal laser photocoagulation; IVK intravitreal kenalog; VMT vitreomacular traction; MH Macular hole;  NVD neovascularization of the disc; NVE neovascularization elsewhere; AREDS age related eye disease study; ARMD age related macular degeneration; POAG primary open angle glaucoma; EBMD epithelial/anterior basement membrane dystrophy; ACIOL anterior chamber intraocular lens; IOL intraocular lens; PCIOL posterior chamber intraocular lens; Phaco/IOL phacoemulsification with intraocular lens placement; [place] photorefractive keratectomy; LASIK laser assisted in situ keratomileusis; HTN hypertension; DM diabetes mellitus; COPD chronic obstructive pulmonary disease

## 2018-01-13 ENCOUNTER — Encounter (INDEPENDENT_AMBULATORY_CARE_PROVIDER_SITE_OTHER): Payer: Self-pay | Admitting: Ophthalmology

## 2018-01-13 ENCOUNTER — Ambulatory Visit (INDEPENDENT_AMBULATORY_CARE_PROVIDER_SITE_OTHER): Payer: 59 | Admitting: Ophthalmology

## 2018-01-13 DIAGNOSIS — H35341 Macular cyst, hole, or pseudohole, right eye: Secondary | ICD-10-CM

## 2018-01-13 DIAGNOSIS — H25811 Combined forms of age-related cataract, right eye: Secondary | ICD-10-CM

## 2018-01-13 DIAGNOSIS — H3581 Retinal edema: Secondary | ICD-10-CM

## 2018-01-13 DIAGNOSIS — Z961 Presence of intraocular lens: Secondary | ICD-10-CM

## 2018-01-13 DIAGNOSIS — Z9889 Other specified postprocedural states: Secondary | ICD-10-CM

## 2018-01-13 DIAGNOSIS — H33331 Multiple defects of retina without detachment, right eye: Secondary | ICD-10-CM

## 2018-02-09 NOTE — Progress Notes (Signed)
[facility] Note  02/10/2018     CHIEF COMPLAINT Patient presents for Post-op Follow-up POW1 s/p 25g PPV/ICG/MP/EL/14% C3F8, OD, 2.14.19  HISTORY OF PRESENT ILLNESS: Sandra Herring is a 57 y.o. female who presents to the clinic today for:   HPI    Post-op Follow-up    In right eye.  Discomfort includes none.  Negative for pain, itching, foreign body sensation, tearing, discharge and floaters.  Vision is blurred at distance, is blurred at near and is worse.  I, the attending physician,  performed the HPI with the patient and updated documentation appropriately.          Comments    S/P 25g PPV/ICG/MP/EL/FAX/14% C3F8 OD. Patient states "vision has not improved, pupil will not close(stopped Atropine gtt's x 1 month),blurriness is horrible, small blind spot center vision and continuous light sensitivity OD".HA's are better per patient.  Pt reports after having cataract sx os she still has glare. Pt is using gtt's as instructed.          Last edited by Bernarda Caffey, MD on 02/10/2018  3:23 PM. (History)    Pt states she is having a lot of glare issues; Pt states she is concerned about returning to work tomorrow; Pt states she has glare issues OU, but feels OS is worse;   Referring physician: Shanon Rosser, PA-C [place], [place] [phone]  HISTORICAL INFORMATION:   Selected notes from the MEDICAL RECORD NUMBER Referred by Dr. 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 Groat) [BCVA OD: 20/30+2 OS: 20/20] Ocular Hx- S/P focal laser OS (2007 - [facility]), pseudophakia OU PMH- migraine, PTSD    CURRENT MEDICATIONS: Current Outpatient Medications (Ophthalmic Drugs)  Medication Sig  . prednisoLONE acetate (PRED FORTE) 1 % ophthalmic suspension Place 1 drop into the right eye 6 (six) times daily.  . brimonidine (ALPHAGAN) 0.15 % ophthalmic solution Place 1 drop into the right eye every 8 (eight) hours.  .  Loteprednol Etabonate (INVELTYS) 1 % SUSP Apply 1 drop to eye 6 (six) times daily. RIGHT EYE   No current facility-administered medications for this visit.  (Ophthalmic Drugs)   Current Outpatient Medications (Other)  Medication Sig  . acetaminophen (TYLENOL) 500 MG tablet Take 1,000 mg by mouth daily as needed for moderate pain.   . Ascorbic Acid (VITAMIN C) 1000 MG tablet Take 4,000 mg by mouth daily.  . cyclobenzaprine (FLEXERIL) 10 MG tablet Take 10 mg by mouth at bedtime.  . diazepam (VALIUM) 5 MG tablet Take 5 mg by mouth at bedtime.   . famotidine (PEPCID) 20 MG tablet Take 20 mg by mouth daily as needed for heartburn.   Marland Kitchen OVER THE COUNTER MEDICATION Take 1,500 mg by mouth daily. Cordyceps otc supplement  . OVER THE COUNTER MEDICATION Take 3 capsules by mouth daily. Bone up otc supplement  . pantoprazole (PROTONIX) 20 MG tablet Take 1 tablet (20 mg total) by mouth daily. (Patient taking differently: Take 20 mg by mouth daily as needed for heartburn or indigestion. )  . propranolol (INDERAL) 20 MG tablet Take 20 mg by mouth at bedtime.   . sertraline (ZOLOFT) 100 MG tablet Take 150 mg by mouth daily.   . sodium chloride (OCEAN) 0.65 % SOLN nasal spray Place 1 spray into both nostrils as needed for congestion.  . traMADol (ULTRAM) 50 MG tablet Take 50 mg by mouth daily as needed for moderate pain.   Marland Kitchen  traZODone (DESYREL) 100 MG tablet Take 50 mg by mouth at bedtime.  . Vitamin D-Vitamin K (VITAMIN K2-VITAMIN D3 PO) Take 2 tablets by mouth daily.   No current facility-administered medications for this visit.  (Other)      REVIEW OF SYSTEMS: ROS    Positive for: Eyes   Negative for: Constitutional, Gastrointestinal, Neurological, Skin, Genitourinary, Musculoskeletal, HENT, Endocrine, Cardiovascular, Respiratory, Psychiatric, Allergic/Imm, Heme/Lymph   Last edited by Zenovia Jordan, LPN on 01/15/253 27:06 AM. (History)       ALLERGIES Allergies  Allergen Reactions  . Serzone  [Nefazodone] Shortness Of Breath    Breathing difficulty  . Nsaids Other (See Comments)    gastritis    PAST MEDICAL HISTORY Past Medical History:  Diagnosis Date  . Anxiety   . Depression   . GERD (gastroesophageal reflux disease)   . Headache    migraines  . PTSD (post-traumatic stress disorder)   . PTSD (post-traumatic stress disorder)   . Seizures ([place])    febrile seizure at age 60 months  . Thyroid nodule   . Vaginal infection    Past Surgical History:  Procedure Laterality Date  . [place] VITRECTOMY WITH 20 GAUGE MVR PORT FOR MACULAR HOLE Right 12/10/2017   Procedure: PARS [place]NA VITRECTOMY WITH 25 GAUGE LAZER AND GAS MACULAR HOLE membrane pale gas;  Surgeon: Bernarda Caffey, MD;  Location: [facility];  Service: Ophthalmology;  Laterality: Right;  . COLONOSCOPY    . HERNIA REPAIR    . TONSILLECTOMY  2005  . TUBAL LIGATION      FAMILY HISTORY Family History  Problem Relation Age of Onset  . Cataracts Mother   . Cataracts Father   . Cataracts Maternal Grandfather   . Amblyopia Neg Hx   . Blindness Neg Hx   . Glaucoma Neg Hx   . Diabetes Neg Hx   . Macular degeneration Neg Hx   . Retinal detachment Neg Hx   . Strabismus Neg Hx   . Retinitis pigmentosa Neg Hx     SOCIAL HISTORY Social History   Tobacco Use  . Smoking status: Former Smoker    Packs/day: 0.25    Last attempt to quit: 10/20/2014    Years since quitting: 3.3  . Smokeless tobacco: Never Used  Substance Use Topics  . Alcohol use: Yes    Comment: occ  . Drug use: No         OPHTHALMIC EXAM:  Base Eye Exam    Visual Acuity (Snellen - Linear)      Right Left   Dist [place] 20/200 20/25 +   Dist ph [place] 20/150        Tonometry (Tonopen, 10:40 AM)      Right Left   Pressure 26 18       Tonometry #2 (Applanation, 11:39 AM)      Right Left   Pressure 26        Tonometry Comments   T2 done by Dr. Coralyn Pear       Pupils      Dark Light Shape React APD   Right 6 6 Round NR None    Left 3 2 Round Brisk None       Visual Fields (Counting fingers)      Left Right    Full Full       Extraocular Movement      Right Left    Full, Ortho Full, Ortho       Neuro/Psych  Oriented x3:  Yes   Mood/Affect:  Normal        Slit Lamp and Fundus Exam    Slit Lamp Exam      Right Left   Lids/Lashes Dermatochalasis - upper lid Dermatochalasis - upper lid   Conjunctiva/Sclera White and quiet White and quiet   Cornea 2-3+ diffuse Punctate epithelial erosions 1+ Punctate epithelial erosions   Anterior Chamber 1+ Cell and pigment Deep and quiet   Iris Round and dilated, TIDs, focal Posterior synechiae at 1100, Irregular pupil, circumferential TID nasal Round and dilated, Transillumination defects from 0600 to 1100   Lens toric PC IOL with marks at 0630, mild pigment deposites on optic PC IOL in good postion with open PC   Vitreous Post vitrectomy; gas bubble 5-10% superiorly Posterior vitreous detachment, vitreous debris, Weiss ring       Fundus Exam      Right Left   Disc compact Normal   C/D Ratio 0.1 0.25   Macula Flat, mac hole closed, Blunted foveal reflex flat; mild ERM; no heme or edema   Vessels Normal Mild Vascular attenuation   Periphery attached; good laser 360 and surrounding multiple tears ragged Horseshoe tear spanning from 1130 to 0130 with good surrounding laser scars, round area of chorioretinal atrophy at 0700          IMAGING AND PROCEDURES  Imaging and Procedures for 02/10/18  OCT, Retina - OU - Both Eyes       Right Eye Quality was borderline. Central Foveal Thickness: 322. Progression has improved. Findings include abnormal foveal contour, no SRF, no IRF (Mild interval improvement in ellipsoid).   Left Eye Quality was good. Central Foveal Thickness: 278. Progression has been stable. Findings include normal foveal contour, no IRF, no SRF, epiretinal membrane.   Notes *Images captured and stored on drive  Diagnosis / Impression:  OD:  FTMH closed with mild interval improvement in ellipsoid OS: mild ERM  Clinical management:  See below  Abbreviations: NFP - Normal foveal profile. CME - cystoid macular edema. PED - pigment epithelial detachment. IRF - intraretinal fluid. SRF - subretinal fluid. EZ - ellipsoid zone. ERM - epiretinal membrane. ORA - outer retinal atrophy. ORT - outer retinal tubulation. SRHM - subretinal hyper-reflective material                  ASSESSMENT/PLAN:    ICD-10-CM   1. Macular hole of right eye H35.341 OCT, Retina - OU - Both Eyes  2. 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 Retinal edema H35.81 OCT, Retina - OU - Both Eyes  4. Pseudophakia of both eyes Z96.1   5. History of repair of retinal tear by laser photocoagulation Z98.890     1. Full thickness macular hole, OD POW9 s/p 25g PPV/ICG/MP/EL/FAX/14% C3F8 OD, 2.14.19  - s/p removal of gas bubble retained in AC, 03.06.19             - IOP slightly elevated today             - cont   Inveltys 6x/daily OD(switched from PF on 04.17.19)                         Atropine BID OD - D/C                         Cosopt BID OD   Brimonidine BID OD  PSO ung PRM OD             - avoid laying flat on back             - post op drop and positioning instructions reviewed             - tylenol/ibuprofen for pain - f/u 1-2 weeks  2. Retinal tears OD-  - s/p laser retinopexy to inferotemporal tears on 2.12.19 - multiple additional tears noted intraoperatively and treated with endolaser on 2.14.19 - laser looks good  3. Retinal edema-  - pre-op: +CME surrounding macular hole  4. Pseudophakia OU  - s/p CE/IOL OU  - OD by Dr. Chauncey Cruel. Groat (09/2017) toric lens in good postion  - beautiful surgeries, doing well  - monitor  5. History of RT with laser retinopexy OS - large, jagged retinal tear superiorly OS with good laser surrounding - however she does have a lot of vitreous debris and  floaters, which may be contributing to her complaints of haze and glare following cataract surgery - BCVA OS is 20/20  - stable   Ophthalmic Meds Ordered this visit:  Meds ordered this encounter  Medications  . brimonidine (ALPHAGAN) 0.15 % ophthalmic solution    Sig: Place 1 drop into the right eye every 8 (eight) hours.    Dispense:  5 mL    Refill:  1  . Loteprednol Etabonate (INVELTYS) 1 % SUSP    Sig: Apply 1 drop to eye 6 (six) times daily. RIGHT EYE    Dispense:  2.8 mL    Refill:  0       Return in about 2 weeks (around 02/24/2018) for POV.  There are no Patient Instructions on file for this visit.   Explained the diagnoses, plan, and follow up with the patient and they expressed understanding.  Patient expressed understanding of the importance of proper follow up care.   This document serves as a record of services personally performed by Gardiner Sleeper, MD, PhD. It was created on their behalf by Catha Brow, Luis M. Cintron, a certified ophthalmic assistant. The creation of this record is the provider's dictation and/or activities during the visit.  Electronically signed by: Catha Brow, [place]  02/10/18 3:26 PM   Gardiner Sleeper, M.D., Ph.D. Diseases & Surgery of the Retina and [place] 02/10/18  I have reviewed the above documentation for accuracy and completeness, and I agree with the above. Gardiner Sleeper, M.D., Ph.D. 02/10/18 3:26 PM    Abbreviations: M myopia (nearsighted); A astigmatism; H hyperopia (farsighted); P presbyopia; Mrx spectacle prescription;  CTL contact lenses; OD right eye; OS left eye; OU both eyes  XT exotropia; ET esotropia; PEK punctate epithelial keratitis; PEE punctate epithelial erosions; DES dry eye syndrome; MGD meibomian gland dysfunction; ATs artificial tears; PFAT's preservative free artificial tears; [place] nuclear sclerotic cataract; PSC posterior subcapsular cataract; ERM epi-retinal membrane; PVD  posterior vitreous detachment; RD retinal detachment; DM diabetes mellitus; DR diabetic retinopathy; NPDR non-proliferative diabetic retinopathy; PDR proliferative diabetic retinopathy; CSME clinically significant macular edema; DME diabetic macular edema; dbh dot blot hemorrhages; CWS cotton wool spot; POAG primary open angle glaucoma; C/D cup-to-disc ratio; HVF humphrey visual field; GVF goldmann visual field; OCT optical coherence tomography; IOP intraocular pressure; BRVO Branch retinal vein occlusion; CRVO central retinal vein occlusion; CRAO central retinal artery occlusion; BRAO branch retinal artery occlusion; RT retinal tear; SB scleral buckle; PPV pars plana vitrectomy; VH Vitreous hemorrhage; PRP  panretinal laser photocoagulation; IVK intravitreal kenalog; VMT vitreomacular traction; MH Macular hole;  NVD neovascularization of the disc; NVE neovascularization elsewhere; AREDS age related eye disease study; ARMD age related macular degeneration; POAG primary open angle glaucoma; EBMD epithelial/anterior basement membrane dystrophy; ACIOL anterior chamber intraocular lens; IOL intraocular lens; PCIOL posterior chamber intraocular lens; Phaco/IOL phacoemulsification with intraocular lens placement; [place] photorefractive keratectomy; LASIK laser assisted in situ keratomileusis; HTN hypertension; DM diabetes mellitus; COPD chronic obstructive pulmonary disease

## 2018-02-10 ENCOUNTER — Encounter (INDEPENDENT_AMBULATORY_CARE_PROVIDER_SITE_OTHER): Payer: Self-pay | Admitting: Ophthalmology

## 2018-02-10 ENCOUNTER — Ambulatory Visit (INDEPENDENT_AMBULATORY_CARE_PROVIDER_SITE_OTHER): Payer: 59 | Admitting: Ophthalmology

## 2018-02-10 DIAGNOSIS — H33331 Multiple defects of retina without detachment, right eye: Secondary | ICD-10-CM

## 2018-02-10 DIAGNOSIS — H35341 Macular cyst, hole, or pseudohole, right eye: Secondary | ICD-10-CM

## 2018-02-10 DIAGNOSIS — H3581 Retinal edema: Secondary | ICD-10-CM | POA: Diagnosis not present

## 2018-02-10 DIAGNOSIS — Z961 Presence of intraocular lens: Secondary | ICD-10-CM

## 2018-02-10 DIAGNOSIS — Z9889 Other specified postprocedural states: Secondary | ICD-10-CM

## 2018-02-10 MED ORDER — BRIMONIDINE TARTRATE 0.15 % OP SOLN: 1.0000 [drp] | Freq: Three times a day (TID) | OPHTHALMIC | 1 refills | Status: DC

## 2018-02-10 MED ORDER — LOTEPREDNOL ETABONATE 1 % OP SUSP: 1.0000 [drp] | Freq: Every day | OPHTHALMIC | 0 refills | Status: DC

## 2018-02-17 ENCOUNTER — Telehealth (INDEPENDENT_AMBULATORY_CARE_PROVIDER_SITE_OTHER): Payer: Self-pay

## 2018-02-17 NOTE — Telephone Encounter (Signed)
Pt called stating Inveltys is causing her vision to "go black" upon standing; Pt states she feels as if she is having "orthostatic hypertension" while using Inveltys; Pt requests to switch back to PF; Spoke to Dr. Vanessa BarbaraZamora prior to calling pt back, he advised it is okay for pt to switch back to PF; Called pt back and relayed to her that she is able to switch back to PF, and to continue using it as Inveltys was used (6x/daily); Pt expressed understanding and endorsed the fact she still had a bottle of PF; Pt advised to call back with any other questions; Pt satisfied with advice;   Virgilio BellingMeredith Brentley Horrell, COA

## 2018-02-23 NOTE — Progress Notes (Signed)
[facility] Note  02/24/2018     CHIEF COMPLAINT Patient presents for Post-op Follow-up s/p 25g PPV/ICG/MP/EL/14% C3F8, OD, 2.14.19  HISTORY OF PRESENT ILLNESS: Sandra Herring is a 57 y.o. female who presents to the clinic today for:   HPI    Post-op Follow-up    In right eye.  Vision is stable.  I, the attending physician,  performed the HPI with the patient and updated documentation appropriately.          Comments    57 y/o female pt here for 2 wk POV OD.  No change in vision OU.  Pupil OD still has not constricted despite last using Atropine gtts 2 mos ago.  Denies pain, flashes, but reports floaters OU.  Pred six times daily OD.  Had been using antibiotic ung qhs od, but is out.       Last edited by Bernarda Caffey, MD on 02/24/2018  2:26 PM. (History)      Referring physician: Shanon Rosser, PA-C [place], [place] [address]  HISTORICAL INFORMATION:   Selected notes from the MEDICAL RECORD NUMBER Referred by Dr. 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 Groat) [BCVA OD: 20/30+2 OS: 20/20] Ocular Hx- S/P focal laser OS (2007 - [place]), pseudophakia OU PMH- migraine, PTSD    CURRENT MEDICATIONS: Current Outpatient Medications (Ophthalmic Drugs)  Medication Sig  . brimonidine (ALPHAGAN) 0.15 % ophthalmic solution Place 1 drop into the right eye every 8 (eight) hours.  . Loteprednol Etabonate (INVELTYS) 1 % SUSP Apply 1 drop to eye 6 (six) times daily. RIGHT EYE  . prednisoLONE acetate (PRED FORTE) 1 % ophthalmic suspension Place 1 drop into the right eye 6 (six) times daily.   No current facility-administered medications for this visit.  (Ophthalmic Drugs)   Current Outpatient Medications (Other)  Medication Sig  . acetaminophen (TYLENOL) 500 MG tablet Take 1,000 mg by mouth daily as needed for moderate pain.   . Ascorbic Acid (VITAMIN C) 1000 MG tablet Take 4,000 mg by mouth daily.  .  cyclobenzaprine (FLEXERIL) 10 MG tablet Take 10 mg by mouth at bedtime.  . diazepam (VALIUM) 5 MG tablet Take 5 mg by mouth at bedtime.   . famotidine (PEPCID) 20 MG tablet Take 20 mg by mouth daily as needed for heartburn.   Marland Kitchen OVER THE COUNTER MEDICATION Take 1,500 mg by mouth daily. Cordyceps otc supplement  . OVER THE COUNTER MEDICATION Take 3 capsules by mouth daily. Bone up otc supplement  . pantoprazole (PROTONIX) 20 MG tablet Take 1 tablet (20 mg total) by mouth daily. (Patient taking differently: Take 20 mg by mouth daily as needed for heartburn or indigestion. )  . propranolol (INDERAL) 20 MG tablet Take 20 mg by mouth at bedtime.   . sertraline (ZOLOFT) 100 MG tablet Take 150 mg by mouth daily.   . sodium chloride (OCEAN) 0.65 % SOLN nasal spray Place 1 spray into both nostrils as needed for congestion.  . traMADol (ULTRAM) 50 MG tablet Take 50 mg by mouth daily as needed for moderate pain.   . traZODone (DESYREL) 100 MG tablet Take 50 mg by mouth at bedtime.  . Vitamin D-Vitamin K (VITAMIN K2-VITAMIN D3 PO) Take 2 tablets by mouth daily.   No current facility-administered medications for this visit.  (Other)      REVIEW OF SYSTEMS: ROS    Positive for: Eyes   Negative  for: Constitutional, Gastrointestinal, Neurological, Skin, Genitourinary, Musculoskeletal, HENT, Endocrine, Cardiovascular, Respiratory, Psychiatric, Allergic/Imm, Heme/Lymph   Last edited by Matthew Folks, COA on 02/24/2018  2:01 PM. (History)       ALLERGIES Allergies  Allergen Reactions  . Serzone [Nefazodone] Shortness Of Breath    Breathing difficulty  . Nsaids Other (See Comments)    gastritis    PAST MEDICAL HISTORY Past Medical History:  Diagnosis Date  . Anxiety   . Depression   . GERD (gastroesophageal reflux disease)   . Headache    migraines  . PTSD (post-traumatic stress disorder)   . PTSD (post-traumatic stress disorder)   . Seizures ([place])    febrile seizure at age 5 months  .  Thyroid nodule   . Vaginal infection    Past Surgical History:  Procedure Laterality Date  . [place] VITRECTOMY WITH 20 GAUGE MVR PORT FOR MACULAR HOLE Right 12/10/2017   Procedure: PARS PLANA VITRECTOMY WITH 25 GAUGE LAZER AND GAS MACULAR HOLE membrane pale gas;  Surgeon: Bernarda Caffey, MD;  Location: [facility];  Service: Ophthalmology;  Laterality: Right;  . COLONOSCOPY    . HERNIA REPAIR    . TONSILLECTOMY  2005  . TUBAL LIGATION      FAMILY HISTORY Family History  Problem Relation Age of Onset  . Cataracts Mother   . Cataracts Father   . Cataracts Maternal Grandfather   . Amblyopia Neg Hx   . Blindness Neg Hx   . Glaucoma Neg Hx   . Diabetes Neg Hx   . Macular degeneration Neg Hx   . Retinal detachment Neg Hx   . Strabismus Neg Hx   . Retinitis pigmentosa Neg Hx     SOCIAL HISTORY Social History   Tobacco Use  . Smoking status: Former Smoker    Packs/day: 0.25    Last attempt to quit: 10/20/2014    Years since quitting: 3.3  . Smokeless tobacco: Never Used  Substance Use Topics  . Alcohol use: Yes    Comment: occ  . Drug use: No         OPHTHALMIC EXAM:  Base Eye Exam    Visual Acuity (Snellen - Linear)      Right Left   Dist [place] 20/200 + 20/30   Dist ph  20/50 + 20/30       Tonometry (Tonopen, 2:00 PM)      Right Left   Pressure 43 16       Tonometry #2 (Tonopen, 2:29 PM)      Right Left   Pressure 44        Tonometry #3 (Tonopen, 2:29 PM)      Right Left   Pressure 50        Pupils      Dark Light Shape React APD   Right 6 6 Round None None   Left 3 2 Round Brisk None       Visual Fields (Counting fingers)      Left Right    Full Full       Extraocular Movement      Right Left    Full, Ortho Full, Ortho       Neuro/Psych    Oriented x3:  Yes   Mood/Affect:  Normal       Dilation   OD already dilated        Slit Lamp and Fundus Exam    Slit Lamp Exam      Right  Left   Lids/Lashes Dermatochalasis - upper  lid Dermatochalasis - upper lid   Conjunctiva/Sclera White and quiet White and quiet   Cornea 2+ central Punctate epithelial erosions, mild endo pigment 1+ Punctate epithelial erosions   Anterior Chamber 0.5+ Cell and pigment Deep and quiet   Iris Round and dilated, TIDs, focal Posterior synechiae at 1100, Irregular pupil, circumferential TID nasal Round and dilated, Transillumination defects from 0600 to 1100   Lens toric PC IOL with marks at 0630, mild pigment deposites on optic PC IOL in good postion with open PC   Vitreous Post vitrectomy; gas bubble gone Posterior vitreous detachment, vitreous debris, Weiss ring       Fundus Exam      Right Left   Disc compact, Pink and Sharp Normal   C/D Ratio 0.1 0.25   Macula Flat, mac hole closed, Blunted foveal reflex flat; mildme or edema   Vessels Normal Mild Vascular attenuation   Periphery attached; good laser 360 and surrounding multiple tears ragged Horseshoe tear spanning from 1130 to 0130 with good surrounding laser scars, round area of chorioretinal atrophy at 0700          IMAGING AND PROCEDURES  Imaging and Procedures for 02/25/18  OCT, Retina - OU - Both Eyes       Right Eye Quality was borderline. Central Foveal Thickness: 314. Progression has improved. Findings include abnormal foveal contour, no SRF, no IRF (Mild interval improvement in ellipsoid).   Left Eye Quality was good. Central Foveal Thickness: 285. Progression has been stable. Findings include normal foveal contour, no IRF, no SRF, epiretinal membrane.   Notes *Images captured and stored on drive  Diagnosis / Impression:  OD: FTMH closed with mild interval improvement in ellipsoid OS: mild ERM  Clinical management:  See below  Abbreviations: NFP - Normal foveal profile. CME - cystoid macular edema. PED - pigment epithelial detachment. IRF - intraretinal fluid. SRF - subretinal fluid. EZ - ellipsoid zone. ERM - epiretinal membrane. ORA - outer retinal  atrophy. ORT - outer retinal tubulation. SRHM - subretinal hyper-reflective material                  ASSESSMENT/PLAN:    ICD-10-CM   1. Macular hole of right eye H35.341 OCT, Retina - OU - Both Eyes  2. 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 Retinal edema H35.81   4. Pseudophakia of both eyes Z96.1   5. History of repair of retinal tear by laser photocoagulation Z98.890   6. 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 Pseudophakia of left eye Z96.1     1. Full thickness macular hole, OD POW11 s/p 25g PPV/ICG/MP/EL/FAX/14% C3F8 OD, 2.14.19  - s/p removal of gas bubble retained in AC, 03.06.19  - gas bubble now gone             - IOP elevated today -- has not been taking IOP drops             - cont   PF TID OD                         Cosopt BID OD   Brimonidine BID OD                         PSO ung PRN OD   Artificial tears QID OD  - updated drop instruction  sheet given              - able to lay on back now gas bubble resolved  - pupil OD remains dilated - f/u 1 week for IOP check  2. Retinal tears OD-  - s/p laser retinopexy to inferotemporal tears on 2.12.19 - multiple additional tears noted intraoperatively and treated with endolaser on 2.14.19 - laser looks good  3. Retinal edema-  - pre-op: +CME surrounding macular hole  4. Pseudophakia OU  - s/p CE/IOL OU  - OD by Dr. Chauncey Cruel. [place] (09/2017) toric lens in good postion  - beautiful surgeries, doing well  - monitor  5. History of RT with laser retinopexy OS - large, jagged retinal tear superiorly OS with good laser surrounding - however she does have a lot of vitreous debris and floaters, which may be contributing to her complaints of haze and glare following cataract surgery - BCVA OS is 20/20  - stable   Ophthalmic Meds Ordered this visit:  No orders of the defined types were placed in this encounter.      Return in about 1 week (around 03/03/2018) for  F/U FTMH repair OD, IOP check.  There are no Patient Instructions on file for this visit.   Explained the diagnoses, plan, and follow up with the patient and they expressed understanding.  Patient expressed understanding of the importance of proper follow up care.   This document serves as a record of services personally performed by Gardiner Sleeper, MD, PhD. It was created on their behalf by Catha Brow, [place], a certified ophthalmic assistant. The creation of this record is the provider's dictation and/or activities during the visit.  Electronically signed by: Catha Brow, COA  02/25/18 10:34 PM   Gardiner Sleeper, M.D., Ph.D. Diseases & Surgery of the Retina and Vitreous [facility] 02/25/18    Abbreviations: M myopia (nearsighted); A astigmatism; H hyperopia (farsighted); P presbyopia; Mrx spectacle prescription;  CTL contact lenses; OD right eye; OS left eye; OU both eyes  XT exotropia; ET esotropia; PEK punctate epithelial keratitis; PEE punctate epithelial erosions; DES dry eye syndrome; MGD meibomian gland dysfunction; ATs artificial tears; PFAT's preservative free artificial tears; [place] nuclear sclerotic cataract; PSC posterior subcapsular cataract; ERM epi-retinal membrane; PVD posterior vitreous detachment; RD retinal detachment; DM diabetes mellitus; DR diabetic retinopathy; NPDR non-proliferative diabetic retinopathy; PDR proliferative diabetic retinopathy; CSME clinically significant macular edema; DME diabetic macular edema; dbh dot blot hemorrhages; CWS cotton wool spot; POAG primary open angle glaucoma; C/D cup-to-disc ratio; HVF humphrey visual field; GVF goldmann visual field; OCT optical coherence tomography; IOP intraocular pressure; BRVO Branch retinal vein occlusion; CRVO central retinal vein occlusion; CRAO central retinal artery occlusion; BRAO branch retinal artery occlusion; RT retinal tear; SB scleral buckle; PPV pars plana vitrectomy; VH  Vitreous hemorrhage; PRP panretinal laser photocoagulation; IVK intravitreal kenalog; VMT vitreomacular traction; MH Macular hole;  NVD neovascularization of the disc; NVE neovascularization elsewhere; AREDS age related eye disease study; ARMD age related macular degeneration; POAG primary open angle glaucoma; EBMD epithelial/anterior basement membrane dystrophy; ACIOL anterior chamber intraocular lens; IOL intraocular lens; PCIOL posterior chamber intraocular lens; Phaco/IOL phacoemulsification with intraocular lens placement; [place] photorefractive keratectomy; LASIK laser assisted in situ keratomileusis; HTN hypertension; DM diabetes mellitus; COPD chronic obstructive pulmonary disease

## 2018-02-24 ENCOUNTER — Encounter (INDEPENDENT_AMBULATORY_CARE_PROVIDER_SITE_OTHER): Payer: Self-pay | Admitting: Ophthalmology

## 2018-02-24 ENCOUNTER — Ambulatory Visit (INDEPENDENT_AMBULATORY_CARE_PROVIDER_SITE_OTHER): Payer: 59 | Admitting: Ophthalmology

## 2018-02-24 DIAGNOSIS — Z9889 Other specified postprocedural states: Secondary | ICD-10-CM

## 2018-02-24 DIAGNOSIS — Z961 Presence of intraocular lens: Secondary | ICD-10-CM

## 2018-02-24 DIAGNOSIS — H35341 Macular cyst, hole, or pseudohole, right eye: Secondary | ICD-10-CM

## 2018-02-24 DIAGNOSIS — H3581 Retinal edema: Secondary | ICD-10-CM

## 2018-02-24 DIAGNOSIS — H33331 Multiple defects of retina without detachment, right eye: Secondary | ICD-10-CM

## 2018-02-24 DIAGNOSIS — H25811 Combined forms of age-related cataract, right eye: Secondary | ICD-10-CM

## 2018-02-25 ENCOUNTER — Encounter (INDEPENDENT_AMBULATORY_CARE_PROVIDER_SITE_OTHER): Payer: Self-pay | Admitting: Ophthalmology

## 2018-03-02 NOTE — Progress Notes (Signed)
[facility] Note  03/03/2018     CHIEF COMPLAINT Patient presents for Post-op Follow-up s/p 25g PPV/ICG/MP/EL/14% C3F8, OD, 2.14.19  HISTORY OF PRESENT ILLNESS: Sandra Herring is a 57 y.o. female who presents to the clinic today for:   HPI    Post-op Follow-up    In right eye.  Discomfort includes none.  Negative for pain, itching, foreign body sensation, tearing, discharge and floaters.  Vision is stable.  I, the attending physician,  performed the HPI with the patient and updated documentation appropriately.          Comments    F/U FTMH repair OD. Patient states her vision "is the same" pupil remains constricted OD. Patient reports she used all the gtt's as instructed.       Last edited by Bernarda Caffey, MD on 03/03/2018 12:27 PM. (History)      Referring physician: Shanon Rosser, PA-C [place], [place] [address]  HISTORICAL INFORMATION:   Selected notes from the MEDICAL RECORD NUMBER Referred by Dr. 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 [place]) [BCVA OD: 20/30+2 OS: 20/20] Ocular Hx- S/P focal laser OS (2007 - [place]), pseudophakia OU PMH- migraine, PTSD    CURRENT MEDICATIONS: Current Outpatient Medications (Ophthalmic Drugs)  Medication Sig  . brimonidine (ALPHAGAN) 0.15 % ophthalmic solution Place 1 drop into the right eye every 8 (eight) hours.  . Loteprednol Etabonate (INVELTYS) 1 % SUSP Apply 1 drop to eye 6 (six) times daily. RIGHT EYE  . prednisoLONE acetate (PRED FORTE) 1 % ophthalmic suspension Place 1 drop into the right eye 6 (six) times daily.   No current facility-administered medications for this visit.  (Ophthalmic Drugs)   Current Outpatient Medications (Other)  Medication Sig  . acetaminophen (TYLENOL) 500 MG tablet Take 1,000 mg by mouth daily as needed for moderate pain.   . Ascorbic Acid (VITAMIN C) 1000 MG tablet Take 4,000 mg by mouth daily.  .  cyclobenzaprine (FLEXERIL) 10 MG tablet Take 10 mg by mouth at bedtime.  . diazepam (VALIUM) 5 MG tablet Take 5 mg by mouth at bedtime.   . famotidine (PEPCID) 20 MG tablet Take 20 mg by mouth daily as needed for heartburn.   Marland Kitchen OVER THE COUNTER MEDICATION Take 1,500 mg by mouth daily. Cordyceps otc supplement  . OVER THE COUNTER MEDICATION Take 3 capsules by mouth daily. Bone up otc supplement  . pantoprazole (PROTONIX) 20 MG tablet Take 1 tablet (20 mg total) by mouth daily. (Patient taking differently: Take 20 mg by mouth daily as needed for heartburn or indigestion. )  . propranolol (INDERAL) 20 MG tablet Take 20 mg by mouth at bedtime.   . sertraline (ZOLOFT) 100 MG tablet Take 150 mg by mouth daily.   . sodium chloride (OCEAN) 0.65 % SOLN nasal spray Place 1 spray into both nostrils as needed for congestion.  . traMADol (ULTRAM) 50 MG tablet Take 50 mg by mouth daily as needed for moderate pain.   . traZODone (DESYREL) 100 MG tablet Take 50 mg by mouth at bedtime.  . Vitamin D-Vitamin K (VITAMIN K2-VITAMIN D3 PO) Take 2 tablets by mouth daily.   No current facility-administered medications for this visit.  (Other)      REVIEW OF SYSTEMS: ROS    Positive for: Eyes   Negative for: Constitutional, Gastrointestinal, Neurological, Skin, Genitourinary, Musculoskeletal, HENT, Endocrine, Cardiovascular, Respiratory, Psychiatric, Allergic/Imm, Heme/Lymph   Last  edited by Zenovia Jordan, LPN on 11/04/3788 24:09 AM. (History)       ALLERGIES Allergies  Allergen Reactions  . Serzone [Nefazodone] Shortness Of Breath    Breathing difficulty  . Nsaids Other (See Comments)    gastritis    PAST MEDICAL HISTORY Past Medical History:  Diagnosis Date  . Anxiety   . Depression   . GERD (gastroesophageal reflux disease)   . Headache    migraines  . PTSD (post-traumatic stress disorder)   . PTSD (post-traumatic stress disorder)   . Seizures ([place])    febrile seizure at age 62 months  .  Thyroid nodule   . Vaginal infection    Past Surgical History:  Procedure Laterality Date  . [place] VITRECTOMY WITH 20 GAUGE MVR PORT FOR MACULAR HOLE Right 12/10/2017   Procedure: PARS PLANA VITRECTOMY WITH 25 GAUGE LAZER AND GAS MACULAR HOLE membrane pale gas;  Surgeon: Bernarda Caffey, MD;  Location: [facility];  Service: Ophthalmology;  Laterality: Right;  . COLONOSCOPY    . HERNIA REPAIR    . TONSILLECTOMY  2005  . TUBAL LIGATION      FAMILY HISTORY Family History  Problem Relation Age of Onset  . Cataracts Mother   . Cataracts Father   . Cataracts Maternal Grandfather   . Amblyopia Neg Hx   . Blindness Neg Hx   . Glaucoma Neg Hx   . Diabetes Neg Hx   . Macular degeneration Neg Hx   . Retinal detachment Neg Hx   . Strabismus Neg Hx   . Retinitis pigmentosa Neg Hx     SOCIAL HISTORY Social History   Tobacco Use  . Smoking status: Former Smoker    Packs/day: 0.25    Last attempt to quit: 10/20/2014    Years since quitting: 3.3  . Smokeless tobacco: Never Used  Substance Use Topics  . Alcohol use: Yes    Comment: occ  . Drug use: No         OPHTHALMIC EXAM:  Base Eye Exam    Visual Acuity (Snellen - Linear)      Right Left   Dist [place] 20/70 -2 20/30 +1   Dist ph [place] 20/60 -1 20/25 +1       Tonometry (Tonopen, 10:43 AM)      Right Left   Pressure 17 17       Pupils      Dark Light Shape React APD   Right 6 6 Round NR None   Left 4 3 Round Brisk None       Neuro/Psych    Oriented x3:  Yes   Mood/Affect:  Normal        Slit Lamp and Fundus Exam    Slit Lamp Exam      Right Left   Lids/Lashes Dermatochalasis - upper lid Dermatochalasis - upper lid   Conjunctiva/Sclera White and quiet White and quiet   Cornea 2+ central Punctate epithelial erosions, mild endo pigment 1+ Punctate epithelial erosions   Anterior Chamber 0.5+ Cell and pigment Deep and quiet   Iris Round and dilated, TIDs, focal Posterior synechiae at 1100, Irregular pupil,  circumferential TID nasal Round and dilated, Transillumination defects from 0600 to 1100   Lens toric PC IOL with marks at 0630, mild pigment deposites on anterior optic PC IOL in good postion with open PC   Vitreous Post vitrectomy; gas bubble gone Posterior vitreous detachment, vitreous debris, Weiss ring  Fundus Exam      Right Left   Disc compact, Pink and Sharp Normal   C/D Ratio 0.1 0.25   Macula Flat, mac hole closed, Blunted foveal reflex flat; mildme or edema   Vessels Normal Mild Vascular attenuation   Periphery attached; good laser 360 and surrounding multiple tears ragged Horseshoe tear spanning from 1130 to 0130 with good surrounding laser scars, round area of chorioretinal atrophy at 0700          IMAGING AND PROCEDURES  Imaging and Procedures for 02/25/18  OCT, Retina - OU - Both Eyes       Right Eye Quality was borderline. Central Foveal Thickness: 314. Progression has improved. Findings include abnormal foveal contour, no SRF, no IRF (Mild interval improvement in ellipsoid).   Left Eye Quality was good. Central Foveal Thickness: 281. Progression has been stable. Findings include normal foveal contour, no IRF, no SRF, epiretinal membrane.   Notes *Images captured and stored on drive  Diagnosis / Impression:  OD: FTMH closed with mild interval improvement in ellipsoid OS: mild ERM  Clinical management:  See below  Abbreviations: NFP - Normal foveal profile. CME - cystoid macular edema. PED - pigment epithelial detachment. IRF - intraretinal fluid. SRF - subretinal fluid. EZ - ellipsoid zone. ERM - epiretinal membrane. ORA - outer retinal atrophy. ORT - outer retinal tubulation. SRHM - subretinal hyper-reflective material                  ASSESSMENT/PLAN:    ICD-10-CM   1. Macular hole of right eye H35.341   2. 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 Retinal edema H35.81 OCT, Retina - OU - Both Eyes  4. Pseudophakia  of both eyes Z96.1   5. History of repair of retinal tear by laser photocoagulation Z98.890   6. 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 Pseudophakia of left eye Z96.1     1. Full thickness macular hole, OD POW12 s/p 25g PPV/ICG/MP/EL/FAX/14% C3F8 OD, 2.14.19  - s/p removal of gas bubble retained in AC, 03.06.19  - gas bubble now gone             - IOP stable at 17 today             - cont   PF TID-QID OD                         Cosopt BID OD   Brimonidine BID OD                         PSO ung PRN OD   Artificial tears QID OD  - updated drop instruction sheet given              - able to lay on back now gas bubble resolved  - pupil OD remains dilated - f/u 6 weeks  2. Retinal tears OD-  - s/p laser retinopexy to inferotemporal tears on 2.12.19 - multiple additional tears noted intraoperatively and treated with endolaser on 2.14.19 - laser looks good  3. Retinal edema-  - pre-op: +CME surrounding macular hole  4. Pseudophakia OU  - s/p CE/IOL OU  - OD by Dr. Chauncey Cruel. Groat (09/2017) toric lens in good postion  - beautiful surgeries, doing well  - monitor  5. History of RT with laser retinopexy OS - large, jagged retinal tear superiorly  OS with good laser surrounding - however she does have a lot of vitreous debris and floaters, which may be contributing to her complaints of haze and glare following cataract surgery - BCVA OS is 20/20  - stable   Ophthalmic Meds Ordered this visit:  No orders of the defined types were placed in this encounter.      No follow-ups on file.  There are no Patient Instructions on file for this visit.   Explained the diagnoses, plan, and follow up with the patient and they expressed understanding.  Patient expressed understanding of the importance of proper follow up care.   This document serves as a record of services personally performed by Gardiner Sleeper, MD, PhD. It was created on their behalf by Catha Brow, [place], a certified ophthalmic assistant. The creation of this record is the provider's dictation and/or activities during the visit.  Electronically signed by: Catha Brow, [place]  05.07.19 12:44 PM   Gardiner Sleeper, M.D., Ph.D. Diseases & Surgery of the Retina and [place] 03/02/18  I have reviewed the above documentation for accuracy and completeness, and I agree with the above. Gardiner Sleeper, M.D., Ph.D. 03/05/18 12:45 PM    Abbreviations: M myopia (nearsighted); A astigmatism; H hyperopia (farsighted); P presbyopia; Mrx spectacle prescription;  CTL contact lenses; OD right eye; OS left eye; OU both eyes  XT exotropia; ET esotropia; PEK punctate epithelial keratitis; PEE punctate epithelial erosions; DES dry eye syndrome; MGD meibomian gland dysfunction; ATs artificial tears; PFAT's preservative free artificial tears; [place] nuclear sclerotic cataract; PSC posterior subcapsular cataract; ERM epi-retinal membrane; PVD posterior vitreous detachment; RD retinal detachment; DM diabetes mellitus; DR diabetic retinopathy; NPDR non-proliferative diabetic retinopathy; PDR proliferative diabetic retinopathy; CSME clinically significant macular edema; DME diabetic macular edema; dbh dot blot hemorrhages; CWS cotton wool spot; POAG primary open angle glaucoma; C/D cup-to-disc ratio; HVF humphrey visual field; GVF goldmann visual field; OCT optical coherence tomography; IOP intraocular pressure; BRVO Branch retinal vein occlusion; CRVO central retinal vein occlusion; CRAO central retinal artery occlusion; BRAO branch retinal artery occlusion; RT retinal tear; SB scleral buckle; PPV pars plana vitrectomy; VH Vitreous hemorrhage; PRP panretinal laser photocoagulation; IVK intravitreal kenalog; VMT vitreomacular traction; MH Macular hole;  NVD neovascularization of the disc; NVE neovascularization elsewhere; AREDS age related eye disease study; ARMD age related macular  degeneration; POAG primary open angle glaucoma; EBMD epithelial/anterior basement membrane dystrophy; ACIOL anterior chamber intraocular lens; IOL intraocular lens; PCIOL posterior chamber intraocular lens; Phaco/IOL phacoemulsification with intraocular lens placement; [place] photorefractive keratectomy; LASIK laser assisted in situ keratomileusis; HTN hypertension; DM diabetes mellitus; COPD chronic obstructive pulmonary disease

## 2018-03-03 ENCOUNTER — Ambulatory Visit (INDEPENDENT_AMBULATORY_CARE_PROVIDER_SITE_OTHER): Payer: 59 | Admitting: Ophthalmology

## 2018-03-03 ENCOUNTER — Encounter (INDEPENDENT_AMBULATORY_CARE_PROVIDER_SITE_OTHER): Payer: Self-pay | Admitting: Ophthalmology

## 2018-03-03 DIAGNOSIS — H3581 Retinal edema: Secondary | ICD-10-CM | POA: Diagnosis not present

## 2018-03-03 DIAGNOSIS — H35341 Macular cyst, hole, or pseudohole, right eye: Secondary | ICD-10-CM

## 2018-03-03 DIAGNOSIS — H33331 Multiple defects of retina without detachment, right eye: Secondary | ICD-10-CM

## 2018-03-03 DIAGNOSIS — H25811 Combined forms of age-related cataract, right eye: Secondary | ICD-10-CM

## 2018-03-03 DIAGNOSIS — Z9889 Other specified postprocedural states: Secondary | ICD-10-CM

## 2018-03-03 DIAGNOSIS — Z961 Presence of intraocular lens: Secondary | ICD-10-CM

## 2018-03-05 ENCOUNTER — Encounter (INDEPENDENT_AMBULATORY_CARE_PROVIDER_SITE_OTHER): Payer: Self-pay | Admitting: Ophthalmology

## 2018-03-15 ENCOUNTER — Encounter (HOSPITAL_COMMUNITY): Payer: Self-pay | Admitting: Psychiatry

## 2018-03-15 ENCOUNTER — Ambulatory Visit (INDEPENDENT_AMBULATORY_CARE_PROVIDER_SITE_OTHER): Payer: 59 | Admitting: Psychiatry

## 2018-03-15 VITALS — BP 153/99 | HR 84 | Ht 68.0 in | Wt 233.0 lb

## 2018-03-15 DIAGNOSIS — F4310 Post-traumatic stress disorder, unspecified: Secondary | ICD-10-CM

## 2018-03-15 DIAGNOSIS — E079 Disorder of thyroid, unspecified: Secondary | ICD-10-CM | POA: Insufficient documentation

## 2018-03-15 DIAGNOSIS — F411 Generalized anxiety disorder: Secondary | ICD-10-CM

## 2018-03-15 DIAGNOSIS — G43909 Migraine, unspecified, not intractable, without status migrainosus: Secondary | ICD-10-CM | POA: Insufficient documentation

## 2018-03-15 DIAGNOSIS — Z87891 Personal history of nicotine dependence: Secondary | ICD-10-CM

## 2018-03-15 DIAGNOSIS — Z62811 Personal history of psychological abuse in childhood: Secondary | ICD-10-CM | POA: Diagnosis not present

## 2018-03-15 DIAGNOSIS — Z62810 Personal history of physical and sexual abuse in childhood: Secondary | ICD-10-CM

## 2018-03-15 DIAGNOSIS — F330 Major depressive disorder, recurrent, mild: Secondary | ICD-10-CM | POA: Diagnosis not present

## 2018-03-15 DIAGNOSIS — Z813 Family history of other psychoactive substance abuse and dependence: Secondary | ICD-10-CM

## 2018-03-15 DIAGNOSIS — Z811 Family history of alcohol abuse and dependence: Secondary | ICD-10-CM

## 2018-03-15 MED ORDER — PROPRANOLOL HCL 20 MG PO TABS: 20.0000 mg | ORAL_TABLET | Freq: Every day | ORAL | 0 refills | Status: DC

## 2018-03-15 MED ORDER — SERTRALINE HCL 100 MG PO TABS: 150.0000 mg | ORAL_TABLET | Freq: Every day | ORAL | 0 refills | Status: DC

## 2018-03-15 MED ORDER — DIAZEPAM 5 MG PO TABS: ORAL_TABLET | ORAL | 1 refills | Status: DC

## 2018-03-15 MED ORDER — TRAZODONE HCL 50 MG PO TABS: 50.0000 mg | ORAL_TABLET | Freq: Every day | ORAL | 0 refills | Status: DC
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
## 2018-03-19 ENCOUNTER — Encounter (INDEPENDENT_AMBULATORY_CARE_PROVIDER_SITE_OTHER): Payer: Self-pay | Admitting: Ophthalmology

## 2018-04-06 NOTE — Progress Notes (Signed)
[facility] Note  04/07/2018     CHIEF COMPLAINT Patient presents for Retina Follow Up (Mac. Hole OD) s/p 25g PPV/ICG/MP/EL/14% C3F8, OD, 2.14.19  HISTORY OF PRESENT ILLNESS: Sandra Herring is a 57 y.o. female who presents to the clinic today for:   HPI    Retina Follow Up    Patient presents with  Other.  In right eye.  This started 2.  Severity is mild.  Since onset it is stable.  I, the attending physician,  performed the HPI with the patient and updated documentation appropriately. Additional comments: Mac. Hole OD          Comments    F/U FTMH repair OD. Patient states her vision is "about the same", her pupil remains consticted, "I wear my sun glasses all the time".Marland Kitchen "I'm back to work and it's very difficult to work all the hours I'm having too, after being off for three months". Patient denies new visual onsets. Pt is compliant with eye gtt's as instructed.       Last edited by Bernarda Caffey, MD on 04/07/2018 11:14 AM. 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 Groat) [BCVA OD: 20/30+2 OS: 20/20] Ocular Hx- S/P focal laser OS (2007 - [place]), pseudophakia OU PMH- migraine, PTSD    CURRENT MEDICATIONS: Current Outpatient Medications (Ophthalmic Drugs)  Medication Sig  . brimonidine (ALPHAGAN) 0.15 % ophthalmic solution Place 1 drop into the right eye every 8 (eight) hours.  . Loteprednol Etabonate (INVELTYS) 1 % SUSP Apply 1 drop to eye 6 (six) times daily. RIGHT EYE  . prednisoLONE acetate (PRED FORTE) 1 % ophthalmic suspension Place 1 drop into the right eye 6  (six) times daily.  . dorzolamide-timolol (COSOPT) 22.3-6.8 MG/ML ophthalmic solution Place 1 drop into the right eye 2 (two) times daily.   No current facility-administered medications for this visit.  (Ophthalmic Drugs)   Current Outpatient Medications (Other)  Medication Sig  . acetaminophen (TYLENOL) 500 MG tablet Take 1,000 mg by mouth daily as needed for moderate pain.   . Ascorbic Acid (VITAMIN C) 1000 MG tablet Take 4,000 mg by mouth daily.  . cyclobenzaprine (FLEXERIL) 10 MG tablet Take 10 mg by mouth at bedtime.  . diazepam (VALIUM) 5 MG tablet Take 1-2 tab as needed for anxiety  . famotidine (PEPCID) 20 MG tablet Take 20 mg by mouth daily as needed for heartburn.   Marland Kitchen OVER THE COUNTER MEDICATION Take 1,500 mg by mouth daily. Cordyceps otc supplement  . OVER THE COUNTER MEDICATION Take 3 capsules by mouth daily. Bone up otc supplement  . pantoprazole (PROTONIX) 20 MG tablet Take 1 tablet (20 mg total) by mouth daily. (Patient taking differently: Take 20 mg by mouth daily as needed for heartburn or indigestion. )  . propranolol (INDERAL) 20 MG tablet Take 1 tablet (20 mg total) by mouth at bedtime.  . sertraline (ZOLOFT) 100 MG tablet Take 1.5 tablets (150 mg total) by mouth daily.  . sodium chloride (OCEAN) 0.65 % SOLN nasal spray Place 1 spray into  both nostrils as needed for congestion.  . traMADol (ULTRAM) 50 MG tablet Take 50 mg by mouth daily as needed for moderate pain.   . traZODone (DESYREL) 50 MG tablet Take 1 tablet (50 mg total) by mouth at bedtime.  . Vitamin D-Vitamin K (VITAMIN K2-VITAMIN D3 PO) Take 2 tablets by mouth daily.   No current facility-administered medications for this visit.  (Other)      REVIEW OF SYSTEMS: ROS    Positive for: Eyes   Negative for: Constitutional, Gastrointestinal, Neurological, Skin, Genitourinary, Musculoskeletal, HENT, Endocrine, Cardiovascular, Respiratory, Psychiatric, Allergic/Imm, Heme/Lymph   Last edited by Zenovia Jordan,  LPN on 7/67/2094 70:96 AM. (History)       ALLERGIES Allergies  Allergen Reactions  . Serzone [Nefazodone] Shortness Of Breath    Breathing difficulty  . Nsaids Other (See Comments)    gastritis    PAST MEDICAL HISTORY Past Medical History:  Diagnosis Date  . Anxiety   . Depression   . GERD (gastroesophageal reflux disease)   . Headache    migraines  . PTSD (post-traumatic stress disorder)   . PTSD (post-traumatic stress disorder)   . Seizures ([place])    febrile seizure at age 70 months  . Thyroid nodule   . Vaginal infection    Past Surgical History:  Procedure Laterality Date  . [place] VITRECTOMY WITH 20 GAUGE MVR PORT FOR MACULAR HOLE Right 12/10/2017   Procedure: PARS PLANA VITRECTOMY WITH 25 GAUGE LAZER AND GAS MACULAR HOLE membrane pale gas;  Surgeon: Bernarda Caffey, MD;  Location: [facility];  Service: Ophthalmology;  Laterality: Right;  . COLONOSCOPY    . HERNIA REPAIR    . TONSILLECTOMY  2005  . TUBAL LIGATION      FAMILY HISTORY Family History  Problem Relation Age of Onset  . Cataracts Mother   . Cataracts Father   . Cataracts Maternal Grandfather   . Amblyopia Neg Hx   . Blindness Neg Hx   . Glaucoma Neg Hx   . Diabetes Neg Hx   . Macular degeneration Neg Hx   . Retinal detachment Neg Hx   . Strabismus Neg Hx   . Retinitis pigmentosa Neg Hx     SOCIAL HISTORY Social History   Tobacco Use  . Smoking status: Former Smoker    Packs/day: 0.25    Last attempt to quit: 10/20/2014    Years since quitting: 3.4  . Smokeless tobacco: Never Used  Substance Use Topics  . Alcohol use: Yes    Comment: occ  . Drug use: No         OPHTHALMIC EXAM:  Base Eye Exam    Visual Acuity (Snellen - Linear)      Right Left   Dist cc 20/40 +2 20/20 -1   Dist ph cc NI NI   Correction:  Glasses       Tonometry (Tonopen, 10:22 AM)      Right Left   Pressure 19 20       Pupils      Dark Light Shape React APD   Right 6 6 Round NR None   Left  5 3 Round Brisk None       Neuro/Psych    Oriented x3:  Yes   Mood/Affect:  Normal        Slit Lamp and Fundus Exam    Slit Lamp Exam      Right Left   Lids/Lashes Dermatochalasis - upper lid Dermatochalasis - upper lid  Conjunctiva/Sclera White and quiet White and quiet   Cornea mild endo pigment Clear   Anterior Chamber 0.5+ Cell and pigment Deep and quiet   Iris Round and dilated, TIDs, focal Posterior synechiae at 1100, Irregular pupil, circumferential TID nasal Round and dilated, Transillumination defects from 0600 to 1100   Lens toric PC IOL with marks at 0630, mild pigment deposites on anterior optic PC IOL in good postion with open PC   Vitreous Post vitrectomy Posterior vitreous detachment, vitreous debris, Weiss ring       Fundus Exam      Right Left   Disc compact, Pink and Sharp Normal   C/D Ratio 0.1 0.25   Macula Flat, mac hole closed, Blunted foveal reflex, mild Retinal pigment epithelial mottling flat; mildme or edema   Vessels Normal Mild Vascular attenuation   Periphery attached; good laser 360 and surrounding multiple tears ragged Horseshoe tear spanning from 1130 to 0130 with good surrounding laser scars, round area of chorioretinal atrophy at 0700          IMAGING AND PROCEDURES  Imaging and Procedures for 02/25/18  OCT, Retina - OU - Both Eyes       Right Eye Quality was borderline. Central Foveal Thickness: 306. Progression has improved. Findings include abnormal foveal contour, no SRF, no IRF (Mild interval improvement in ellipsoid).   Left Eye Quality was good. Central Foveal Thickness: 277. Progression has been stable. Findings include normal foveal contour, no IRF, no SRF, epiretinal membrane.   Notes *Images captured and stored on drive  Diagnosis / Impression:  OD: FTMH closed with mild interval improvement in ellipsoid OS: mild ERM  Clinical management:  See below  Abbreviations: NFP - Normal foveal profile. CME - cystoid macular  edema. PED - pigment epithelial detachment. IRF - intraretinal fluid. SRF - subretinal fluid. EZ - ellipsoid zone. ERM - epiretinal membrane. ORA - outer retinal atrophy. ORT - outer retinal tubulation. SRHM - subretinal hyper-reflective material                  ASSESSMENT/PLAN:    ICD-10-CM   1. Macular hole of right eye H35.341   2. 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 Retinal edema H35.81 OCT, Retina - OU - Both Eyes  4. Pseudophakia of both eyes Z96.1   5. History of repair of retinal tear by laser photocoagulation Z98.890     1. Full thickness macular hole, OD POM4 s/p 25g PPV/ICG/MP/EL/FAX/14% C3F8 OD, 2.14.19  - s/p removal of gas bubble retained in AC, 03.06.19  - macular hole closed nicely with some reconstitution of ellipsoid still occurring             - IOP stable at 19 today             - cont   PF TID OD                         Cosopt BID OD   Brimonidine BID OD                         PSO ung PRN OD   Artificial tears QID OD  - updated drop instruction sheet given   - pupil OD remains dilated - f/u 6 weeks  2. Retinal tears OD-  - s/p laser retinopexy to inferotemporal tears on 2.12.19 - multiple additional tears noted intraoperatively and treated with endolaser  on 2.14.19 - laser looks good - monitor  3. Retinal edema-  - pre-op: +CME surrounding macular hole  4. Pseudophakia OU  - s/p CE/IOL OU  - OD by Dr. Chauncey Cruel. Groat (09/2017) toric lens in good postion  - beautiful surgeries, doing well  - monitor  5. History of RT with laser retinopexy OS - large, jagged retinal tear superiorly OS with good laser surrounding - however she does have a lot of vitreous debris and floaters, which may be contributing to her complaints of haze and glare following cataract surgery - BCVA OS remains 20/20  - stable   Ophthalmic Meds Ordered this visit:  Meds ordered this encounter  Medications  . brimonidine (ALPHAGAN) 0.15 %  ophthalmic solution    Sig: Place 1 drop into the right eye every 8 (eight) hours.    Dispense:  10 mL    Refill:  5  . dorzolamide-timolol (COSOPT) 22.3-6.8 MG/ML ophthalmic solution    Sig: Place 1 drop into the right eye 2 (two) times daily.    Dispense:  10 mL    Refill:  5       Return for 6-8 wks, Dilated Exam, OCT.  There are no Patient Instructions on file for this visit.   Explained the diagnoses, plan, and follow up with the patient and they expressed understanding.  Patient expressed understanding of the importance of proper follow up care.   This document serves as a record of services personally performed by Gardiner Sleeper, MD, PhD. It was created on their behalf by Catha Brow, [place], a certified ophthalmic assistant. The creation of this record is the provider's dictation and/or activities during the visit.  Electronically signed by: Catha Brow, COA  06.11.19 10:54 PM   Gardiner Sleeper, M.D., Ph.D. Diseases & Surgery of the Retina and Vitreous [facility]  I have reviewed the above documentation for accuracy and completeness, and I agree with the above. Gardiner Sleeper, M.D., Ph.D. 04/11/18 10:57 PM   Abbreviations: M myopia (nearsighted); A astigmatism; H hyperopia (farsighted); P presbyopia; Mrx spectacle prescription;  CTL contact lenses; OD right eye; OS left eye; OU both eyes  XT exotropia; ET esotropia; PEK punctate epithelial keratitis; PEE punctate epithelial erosions; DES dry eye syndrome; MGD meibomian gland dysfunction; ATs artificial tears; PFAT's preservative free artificial tears; [place] nuclear sclerotic cataract; PSC posterior subcapsular cataract; ERM epi-retinal membrane; PVD posterior vitreous detachment; RD retinal detachment; DM diabetes mellitus; DR diabetic retinopathy; NPDR non-proliferative diabetic retinopathy; PDR proliferative diabetic retinopathy; CSME clinically significant macular edema; DME diabetic macular  edema; dbh dot blot hemorrhages; CWS cotton wool spot; POAG primary open angle glaucoma; C/D cup-to-disc ratio; HVF humphrey visual field; GVF goldmann visual field; OCT optical coherence tomography; IOP intraocular pressure; BRVO Branch retinal vein occlusion; CRVO central retinal vein occlusion; CRAO central retinal artery occlusion; BRAO branch retinal artery occlusion; RT retinal tear; SB scleral buckle; PPV pars plana vitrectomy; VH Vitreous hemorrhage; PRP panretinal laser photocoagulation; IVK intravitreal kenalog; VMT vitreomacular traction; MH Macular hole;  NVD neovascularization of the disc; NVE neovascularization elsewhere; AREDS age related eye disease study; ARMD age related macular degeneration; POAG primary open angle glaucoma; EBMD epithelial/anterior basement membrane dystrophy; ACIOL anterior chamber intraocular lens; IOL intraocular lens; PCIOL posterior chamber intraocular lens; Phaco/IOL phacoemulsification with intraocular lens placement; [place] photorefractive keratectomy; LASIK laser assisted in situ keratomileusis; HTN hypertension; DM diabetes mellitus; COPD chronic obstructive pulmonary disease

## 2018-04-07 ENCOUNTER — Encounter (INDEPENDENT_AMBULATORY_CARE_PROVIDER_SITE_OTHER): Payer: Self-pay | Admitting: Ophthalmology

## 2018-04-07 ENCOUNTER — Ambulatory Visit (INDEPENDENT_AMBULATORY_CARE_PROVIDER_SITE_OTHER): Payer: 59 | Admitting: Ophthalmology

## 2018-04-07 DIAGNOSIS — H3581 Retinal edema: Secondary | ICD-10-CM

## 2018-04-07 DIAGNOSIS — H33331 Multiple defects of retina without detachment, right eye: Secondary | ICD-10-CM | POA: Diagnosis not present

## 2018-04-07 DIAGNOSIS — Z9889 Other specified postprocedural states: Secondary | ICD-10-CM

## 2018-04-07 DIAGNOSIS — H35341 Macular cyst, hole, or pseudohole, right eye: Secondary | ICD-10-CM | POA: Diagnosis not present

## 2018-04-07 DIAGNOSIS — Z961 Presence of intraocular lens: Secondary | ICD-10-CM

## 2018-04-07 MED ORDER — DORZOLAMIDE HCL-TIMOLOL MAL 2-0.5 % OP SOLN: 1.0000 [drp] | Freq: Two times a day (BID) | OPHTHALMIC | 5 refills | Status: DC

## 2018-04-07 MED ORDER — BRIMONIDINE TARTRATE 0.15 % OP SOLN: 1.0000 [drp] | Freq: Three times a day (TID) | OPHTHALMIC | 5 refills | Status: DC

## 2018-04-11 ENCOUNTER — Encounter (INDEPENDENT_AMBULATORY_CARE_PROVIDER_SITE_OTHER): Payer: Self-pay | Admitting: Ophthalmology

## 2018-04-27 ENCOUNTER — Encounter (HOSPITAL_COMMUNITY): Payer: Self-pay | Admitting: Psychiatry

## 2018-04-27 ENCOUNTER — Ambulatory Visit (INDEPENDENT_AMBULATORY_CARE_PROVIDER_SITE_OTHER): Payer: 59 | Admitting: Psychiatry

## 2018-04-27 DIAGNOSIS — F411 Generalized anxiety disorder: Secondary | ICD-10-CM | POA: Diagnosis not present

## 2018-04-27 DIAGNOSIS — F330 Major depressive disorder, recurrent, mild: Secondary | ICD-10-CM | POA: Diagnosis not present

## 2018-04-27 MED ORDER — PROPRANOLOL HCL 20 MG PO TABS: 20.0000 mg | ORAL_TABLET | Freq: Every day | ORAL | 0 refills | Status: DC

## 2018-04-27 MED ORDER — SERTRALINE HCL 100 MG PO TABS: 150.0000 mg | ORAL_TABLET | Freq: Every day | ORAL | 0 refills | Status: DC

## 2018-04-27 MED ORDER — TRAZODONE HCL 50 MG PO TABS: 50.0000 mg | ORAL_TABLET | Freq: Every day | ORAL | 0 refills | Status: DC

## 2018-04-27 MED ORDER — DIAZEPAM 5 MG PO TABS: ORAL_TABLET | ORAL | 1 refills | Status: DC
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
## 2018-05-26 ENCOUNTER — Encounter (INDEPENDENT_AMBULATORY_CARE_PROVIDER_SITE_OTHER): Payer: 59 | Admitting: Ophthalmology

## 2018-05-26 ENCOUNTER — Encounter (INDEPENDENT_AMBULATORY_CARE_PROVIDER_SITE_OTHER): Payer: Self-pay

## 2018-05-27 NOTE — Progress Notes (Signed)
This encounter was created in error - please disregard.

## 2018-06-01 ENCOUNTER — Other Ambulatory Visit (HOSPITAL_COMMUNITY): Payer: Self-pay | Admitting: Psychiatry

## 2018-06-01 DIAGNOSIS — F411 Generalized anxiety disorder: Secondary | ICD-10-CM

## 2018-06-07 NOTE — Progress Notes (Signed)
[facility] Note  06/08/2018     CHIEF COMPLAINT Patient presents for Retina Follow Up s/p 25g PPV/ICG/MP/EL/14% C3F8, OD, 2.14.19  HISTORY OF PRESENT ILLNESS: Sandra Herring is a 57 y.o. female who presents to the clinic today for:   HPI    Retina Follow Up    Patient presents with  Other.  In right eye.  This started 3 months ago.  Severity is mild.  Since onset it is stable.  I, the attending physician,  performed the HPI with the patient and updated documentation appropriately.          Comments    F/U FTMH repair OD. Patient states her vision remains "the same", denies new visual onsets since last ov. Denies gtt's.       Last edited by Bernarda Caffey, MD on 06/08/2018  8:52 AM. (History)    Pt states she stopped all gtts x 1 month; Pt states she stopped all gtts herself due to "nasal burning";   Referring physician: Shanon Rosser, PA-C [place], [place] [id number]  HISTORICAL INFORMATION:   Selected notes from the MEDICAL RECORD NUMBER Referred by Dr. 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 Groat) [BCVA OD: 20/30+2 OS: 20/20] Ocular Hx- S/P focal laser OS (2007 - [facility]), pseudophakia OU PMH- migraine, PTSD    CURRENT MEDICATIONS: Current Outpatient Medications (Ophthalmic Drugs)  Medication Sig  . Loteprednol Etabonate (INVELTYS) 1 % SUSP Apply 1 drop to eye 6 (six) times daily. RIGHT EYE  . brimonidine (ALPHAGAN) 0.15 % ophthalmic solution Place 1 drop into the right eye every 8 (eight) hours. (Patient not taking: Reported on 06/08/2018)  . dorzolamide-timolol (COSOPT) 22.3-6.8 MG/ML ophthalmic solution Place 1 drop into the right eye 2 (two) times daily. (Patient not taking: Reported on 06/08/2018)  . prednisoLONE acetate (PRED FORTE) 1 % ophthalmic suspension Place 1 drop into the right eye 6 (six) times daily. (Patient not taking: Reported on 06/08/2018)   No current  facility-administered medications for this visit.  (Ophthalmic Drugs)   Current Outpatient Medications (Other)  Medication Sig  . acetaminophen (TYLENOL) 500 MG tablet Take 1,000 mg by mouth daily as needed for moderate pain.   . Ascorbic Acid (VITAMIN C) 1000 MG tablet Take 4,000 mg by mouth daily.  . cyclobenzaprine (FLEXERIL) 10 MG tablet Take 10 mg by mouth at bedtime.  . diazepam (VALIUM) 5 MG tablet Take 1-2 tab as needed for anxiety  . famotidine (PEPCID) 20 MG tablet Take 20 mg by mouth daily as needed for heartburn.   . levocetirizine (XYZAL) 5 MG tablet Take 5 mg by mouth every evening.  . pantoprazole (PROTONIX) 20 MG tablet Take 1 tablet (20 mg total) by mouth daily.  . propranolol (INDERAL) 20 MG tablet Take 1 tablet (20 mg total) by mouth at bedtime.  . sertraline (ZOLOFT) 100 MG tablet Take 1.5 tablets (150 mg total) by mouth daily.  . sodium chloride (OCEAN) 0.65 % SOLN nasal spray Place 1 spray into both nostrils as needed for congestion.  . traMADol (ULTRAM) 50 MG tablet Take 50 mg by mouth daily as needed for moderate pain.   . traZODone (DESYREL) 50 MG tablet Take 1 tablet (50 mg total) by mouth at bedtime.  . Vitamin D-Vitamin K (VITAMIN K2-VITAMIN D3 PO) Take 2 tablets by mouth daily.   No current facility-administered medications for this visit.  (Other)  REVIEW OF SYSTEMS: ROS    Positive for: Eyes   Negative for: Constitutional, Gastrointestinal, Neurological, Skin, Genitourinary, Musculoskeletal, HENT, Cardiovascular, Respiratory, Psychiatric, Allergic/Imm, Heme/Lymph   Last edited by Zenovia Jordan, LPN on 1/49/7026  3:78 AM. (History)       ALLERGIES Allergies  Allergen Reactions  . Serzone [Nefazodone] Shortness Of Breath    Breathing difficulty  . Nsaids Other (See Comments)    gastritis    PAST MEDICAL HISTORY Past Medical History:  Diagnosis Date  . Anxiety   . Depression   . GERD (gastroesophageal reflux disease)   . Headache     migraines  . PTSD (post-traumatic stress disorder)   . PTSD (post-traumatic stress disorder)   . Seizures ([place])    febrile seizure at age 77 months  . Thyroid nodule   . Vaginal infection    Past Surgical History:  Procedure Laterality Date  . [place] VITRECTOMY WITH 20 GAUGE MVR PORT FOR MACULAR HOLE Right 12/10/2017   Procedure: PARS PLANA VITRECTOMY WITH 25 GAUGE LAZER AND GAS MACULAR HOLE membrane pale gas;  Surgeon: Bernarda Caffey, MD;  Location: [facility];  Service: Ophthalmology;  Laterality: Right;  . COLONOSCOPY    . HERNIA REPAIR    . TONSILLECTOMY  2005  . TUBAL LIGATION      FAMILY HISTORY Family History  Problem Relation Age of Onset  . Cataracts Mother   . Cataracts Father   . Cataracts Maternal Grandfather   . Amblyopia Neg Hx   . Blindness Neg Hx   . Glaucoma Neg Hx   . Diabetes Neg Hx   . Macular degeneration Neg Hx   . Retinal detachment Neg Hx   . Strabismus Neg Hx   . Retinitis pigmentosa Neg Hx     SOCIAL HISTORY Social History   Tobacco Use  . Smoking status: Former Smoker    Packs/day: 0.25    Last attempt to quit: 10/20/2014    Years since quitting: 3.6  . Smokeless tobacco: Never Used  Substance Use Topics  . Alcohol use: Yes    Comment: occ  . Drug use: No         OPHTHALMIC EXAM:  Base Eye Exam    Visual Acuity (Snellen - Linear)      Right Left   Dist cc 20/40 20/20   Dist ph cc NI NI   Correction:  Glasses       Tonometry (Tonopen, 8:24 AM)      Right Left   Pressure 17 18       Pupils      Dark Light Shape React APD   Right 6 6 Round NR None   Left 4 3 Round Brisk None       Visual Fields (Counting fingers)      Left Right    Full Full       Extraocular Movement      Right Left    Full, Ortho Full, Ortho       Neuro/Psych    Oriented x3:  Yes   Mood/Affect:  Normal       Dilation    Left eye:  1.0% Mydriacyl, Paremyd @ 8:25 AM        Slit Lamp and Fundus Exam    Slit Lamp Exam      Right  Left   Lids/Lashes Dermatochalasis - upper lid Dermatochalasis - upper lid   Conjunctiva/Sclera White and quiet White and quiet   Cornea  mild endo pigment Clear   Anterior Chamber Deep and quiet Deep and quiet   Iris Round and dilated, TIDs, focal Posterior synechiae at 1100, Irregular pupil, circumferential TID nasal Round and dilated, Transillumination defects from 0600 to 1100   Lens toric PC IOL with marks at 0630, mild pigment deposits on anterior optic, 1+ Posterior capsular opacification PC IOL in good postion with open PC   Vitreous Post vitrectomy Posterior vitreous detachment, vitreous debris, Weiss ring       Fundus Exam      Right Left   Disc compact, Pink and Sharp Pink and Sharp   C/D Ratio 0.1 0.2   Macula Flat, mac hole closed, Blunted foveal reflex, mild Retinal pigment epithelial mottling flat; blunted foveal reflex   Vessels Mild Vascular attenuation Mild Vascular attenuation   Periphery attached; excellent laser 360 and surrounding multiple tears ragged Horseshoe tear spanning from 1130 to 0130 with good surrounding laser scars, round area of chorioretinal atrophy at 0700          IMAGING AND PROCEDURES  Imaging and Procedures for 02/25/18  OCT, Retina - OU - Both Eyes       Right Eye Quality was good. Central Foveal Thickness: 306. Progression has been stable. Findings include abnormal foveal contour, no SRF, no IRF (Stable ellipsoid thinning, macular hole closed).   Left Eye Quality was good. Central Foveal Thickness: 277. Progression has been stable. Findings include normal foveal contour, no IRF, no SRF, epiretinal membrane.   Notes *Images captured and stored on drive  Diagnosis / Impression:  OD: FTMH closed with stable ellipsoid thinning OS: mild ERM  Clinical management:  See below  Abbreviations: NFP - Normal foveal profile. CME - cystoid macular edema. PED - pigment epithelial detachment. IRF - intraretinal fluid. SRF - subretinal fluid. EZ -  ellipsoid zone. ERM - epiretinal membrane. ORA - outer retinal atrophy. ORT - outer retinal tubulation. SRHM - subretinal hyper-reflective material                  ASSESSMENT/PLAN:    ICD-10-CM   1. Macular hole of right eye H35.341 OCT, Retina - OU - Both Eyes  2. 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 Retinal edema H35.81 OCT, Retina - OU - Both Eyes  4. Pseudophakia of both eyes Z96.1   5. History of repair of retinal tear by laser photocoagulation Z98.890     1. Full thickness macular hole, OD POM4 s/p 25g PPV/ICG/MP/EL/FAX/14% C3F8 OD, 2.14.19  - s/p removal of gas bubble retained in AC, 03.06.19  - macular hole closed nicely with some reconstitution of ellipsoid still occurring             - IOP stable at 17 today             - okay off all gtts -- pt self d/c 4-5 wks ago - f/u 4-6 months  2. Retinal tears OD-  - s/p laser retinopexy to inferotemporal tears on 2.12.19 - multiple additional tears noted intraoperatively and treated with endolaser on 2.14.19 - laser looks good - monitor  3. Retinal edema-  - pre-op: +CME surrounding macular hole  4. Pseudophakia OU  - s/p CE/IOL OU  - OD by Dr. Chauncey Cruel. Groat (09/2017) toric lens in good postion  - beautiful surgeries, doing well  - monitor  5. History of RT with laser retinopexy OS - large, jagged retinal tear superiorly OS with good laser surrounding - however  she does have a lot of vitreous debris and floaters, which may be contributing to her complaints of haze and glare following cataract surgery - BCVA OS remains 20/20  - stable   Ophthalmic Meds Ordered this visit:  No orders of the defined types were placed in this encounter.      Return in about 6 months (around 12/09/2018) for F/U [facility] repair OD, DFE, OCT.  There are no Patient Instructions on file for this visit.   Explained the diagnoses, plan, and follow up with the patient and they expressed understanding.  Patient  expressed understanding of the importance of proper follow up care.   This document serves as a record of services personally performed by Gardiner Sleeper, MD, PhD. It was created on their behalf by Ernest Mallick, OA, an ophthalmic assistant. The creation of this record is the provider's dictation and/or activities during the visit.    Electronically signed by: Ernest Mallick, OA  08.12.2019 9:08 AM    Gardiner Sleeper, M.D., Ph.D. Diseases & Surgery of the Retina and Vitreous [facility]  I have reviewed the above documentation for accuracy and completeness, and I agree with the above. Gardiner Sleeper, M.D., Ph.D. 06/08/18 9:10 AM    Abbreviations: M myopia (nearsighted); A astigmatism; H hyperopia (farsighted); P presbyopia; Mrx spectacle prescription;  CTL contact lenses; OD right eye; OS left eye; OU both eyes  XT exotropia; ET esotropia; PEK punctate epithelial keratitis; PEE punctate epithelial erosions; DES dry eye syndrome; MGD meibomian gland dysfunction; ATs artificial tears; PFAT's preservative free artificial tears; [place] nuclear sclerotic cataract; PSC posterior subcapsular cataract; ERM epi-retinal membrane; PVD posterior vitreous detachment; RD retinal detachment; DM diabetes mellitus; DR diabetic retinopathy; NPDR non-proliferative diabetic retinopathy; PDR proliferative diabetic retinopathy; CSME clinically significant macular edema; DME diabetic macular edema; dbh dot blot hemorrhages; CWS cotton wool spot; POAG primary open angle glaucoma; C/D cup-to-disc ratio; HVF humphrey visual field; GVF goldmann visual field; OCT optical coherence tomography; IOP intraocular pressure; BRVO Branch retinal vein occlusion; CRVO central retinal vein occlusion; CRAO central retinal artery occlusion; BRAO branch retinal artery occlusion; RT retinal tear; SB scleral buckle; PPV pars plana vitrectomy; VH Vitreous hemorrhage; PRP panretinal laser photocoagulation; IVK intravitreal  kenalog; VMT vitreomacular traction; MH Macular hole;  NVD neovascularization of the disc; NVE neovascularization elsewhere; AREDS age related eye disease study; ARMD age related macular degeneration; POAG primary open angle glaucoma; EBMD epithelial/anterior basement membrane dystrophy; ACIOL anterior chamber intraocular lens; IOL intraocular lens; PCIOL posterior chamber intraocular lens; Phaco/IOL phacoemulsification with intraocular lens placement; [place] photorefractive keratectomy; LASIK laser assisted in situ keratomileusis; HTN hypertension; DM diabetes mellitus; COPD chronic obstructive pulmonary disease

## 2018-06-08 ENCOUNTER — Ambulatory Visit (INDEPENDENT_AMBULATORY_CARE_PROVIDER_SITE_OTHER): Payer: 59 | Admitting: Ophthalmology

## 2018-06-08 ENCOUNTER — Encounter (INDEPENDENT_AMBULATORY_CARE_PROVIDER_SITE_OTHER): Payer: Self-pay | Admitting: Ophthalmology

## 2018-06-08 DIAGNOSIS — Z961 Presence of intraocular lens: Secondary | ICD-10-CM | POA: Diagnosis not present

## 2018-06-08 DIAGNOSIS — Z9889 Other specified postprocedural states: Secondary | ICD-10-CM

## 2018-06-08 DIAGNOSIS — H35341 Macular cyst, hole, or pseudohole, right eye: Secondary | ICD-10-CM

## 2018-06-08 DIAGNOSIS — H3581 Retinal edema: Secondary | ICD-10-CM | POA: Diagnosis not present

## 2018-06-08 DIAGNOSIS — H33331 Multiple defects of retina without detachment, right eye: Secondary | ICD-10-CM | POA: Diagnosis not present

## 2018-07-27 ENCOUNTER — Encounter (HOSPITAL_COMMUNITY): Payer: Self-pay | Admitting: Psychiatry

## 2018-07-27 ENCOUNTER — Ambulatory Visit (INDEPENDENT_AMBULATORY_CARE_PROVIDER_SITE_OTHER): Payer: 59 | Admitting: Psychiatry

## 2018-07-27 DIAGNOSIS — F330 Major depressive disorder, recurrent, mild: Secondary | ICD-10-CM

## 2018-07-27 DIAGNOSIS — F411 Generalized anxiety disorder: Secondary | ICD-10-CM

## 2018-07-27 DIAGNOSIS — F4310 Post-traumatic stress disorder, unspecified: Secondary | ICD-10-CM | POA: Diagnosis not present

## 2018-07-27 MED ORDER — SERTRALINE HCL 100 MG PO TABS: 150.0000 mg | ORAL_TABLET | Freq: Every day | ORAL | 0 refills | Status: DC

## 2018-07-27 MED ORDER — DIAZEPAM 5 MG PO TABS: 5.0000 mg | ORAL_TABLET | Freq: Every day | ORAL | 1 refills | Status: DC | PRN

## 2018-07-27 MED ORDER — PROPRANOLOL HCL 20 MG PO TABS: 20.0000 mg | ORAL_TABLET | Freq: Every day | ORAL | 0 refills | Status: DC
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
## 2018-08-31 ENCOUNTER — Other Ambulatory Visit (HOSPITAL_COMMUNITY): Payer: Self-pay | Admitting: Psychiatry

## 2018-08-31 DIAGNOSIS — F411 Generalized anxiety disorder: Secondary | ICD-10-CM

## 2018-09-21 ENCOUNTER — Telehealth (HOSPITAL_COMMUNITY): Payer: Self-pay

## 2018-09-21 NOTE — Telephone Encounter (Signed)
Patient is calling to see if you can prescribe her more Valium. She received #30 on November 5th, but states that she usually gets [id number] and with it being the holiday she has needed to take an extra here and there. Please review and advise, thank you

## 2018-09-22 ENCOUNTER — Other Ambulatory Visit (HOSPITAL_COMMUNITY): Payer: Self-pay

## 2018-09-22 DIAGNOSIS — F411 Generalized anxiety disorder: Secondary | ICD-10-CM

## 2018-09-22 MED ORDER — DIAZEPAM 5 MG PO TABS: 5.0000 mg | ORAL_TABLET | Freq: Every day | ORAL | 0 refills | Status: DC

## 2018-11-02 ENCOUNTER — Ambulatory Visit (INDEPENDENT_AMBULATORY_CARE_PROVIDER_SITE_OTHER): Payer: 59 | Admitting: Psychiatry

## 2018-11-02 ENCOUNTER — Encounter (HOSPITAL_COMMUNITY): Payer: Self-pay | Admitting: Psychiatry

## 2018-11-02 DIAGNOSIS — F411 Generalized anxiety disorder: Secondary | ICD-10-CM | POA: Diagnosis not present

## 2018-11-02 DIAGNOSIS — F330 Major depressive disorder, recurrent, mild: Secondary | ICD-10-CM

## 2018-11-02 MED ORDER — DIAZEPAM 5 MG PO TABS: 5.0000 mg | ORAL_TABLET | Freq: Every day | ORAL | 1 refills | Status: DC

## 2018-11-02 MED ORDER — SERTRALINE HCL 100 MG PO TABS: 100.0000 mg | ORAL_TABLET | Freq: Every day | ORAL | 0 refills | Status: DC

## 2018-11-02 MED ORDER — PROPRANOLOL HCL 20 MG PO TABS: 20.0000 mg | ORAL_TABLET | Freq: Every day | ORAL | 0 refills | Status: DC

## 2018-11-02 MED ORDER — BUPROPION HCL 75 MG PO TABS: 75.0000 mg | ORAL_TABLET | Freq: Two times a day (BID) | ORAL | 1 refills | Status: DC
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
## 2018-11-16 ENCOUNTER — Other Ambulatory Visit (HOSPITAL_COMMUNITY): Payer: Self-pay | Admitting: Psychiatry

## 2018-11-16 DIAGNOSIS — F411 Generalized anxiety disorder: Secondary | ICD-10-CM

## 2018-12-03 ENCOUNTER — Telehealth (HOSPITAL_COMMUNITY): Payer: Self-pay

## 2018-12-03 NOTE — Telephone Encounter (Signed)
Patient is calling to let you know that the Valium that she has been taking is made by [facility] - they are currently not able to make the Valium and she got her last prescription made by a different manufacturer. She normally just takes one at bedtime, but lately she has been waking up around 4:30 am in panic and she has to take another. She feels this is due to the fact that the Valium is from a different manufacturer. Patients prescription is written so that she can take two a day if needed and she just wanted you to know what was going on so that we could change the script from 45 tablets to 60 so that she can continue to take the 2 a day when needed. Please review and advise, thank you

## 2018-12-08 NOTE — Progress Notes (Signed)
[facility] Note  12/10/2018     CHIEF COMPLAINT Patient presents for Retina Follow Up s/p 25g PPV/ICG/MP/EL/14% C3F8, OD, 2.14.19  HISTORY OF PRESENT ILLNESS: Sandra Herring is a 58 y.o. female who presents to the clinic today for:   HPI    Retina Follow Up    Patient presents with  Other.  In right eye.  This started 9 months ago.  Severity is mild.  Since onset it is stable.  I, the attending physician,  performed the HPI with the patient and updated documentation appropriately.          Comments     MOS. F/U [facility] OD. Patient states "I still can't see,I still have floaters". Denies new visual onsets.       Last edited by Bernarda Caffey, MD on 12/10/2018  8:46 AM. (History)    pt states it is hard to see, she states it is like looking through a screen, she states her left eye has one area of clear vision and the rest is blurry and has a cloud when she is in bright light, she states it is like looking through wax paper, pt prefers to be in dim/dark lighting, pt states she does not currently have an appt with Dr. Katy Fitch    Referring physician: Shanon Rosser, PA-C [place], [place] [phone]  HISTORICAL INFORMATION:   Selected notes from the MEDICAL RECORD NUMBER Referred by Dr. 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 Groat) [BCVA OD: 20/30+2 OS: 20/20] Ocular Hx- S/P focal laser OS (2007 - [facility]), pseudophakia OU PMH- migraine, PTSD    CURRENT MEDICATIONS: Current Outpatient Medications (Ophthalmic Drugs)  Medication Sig  . brimonidine (ALPHAGAN) 0.15 % ophthalmic solution Place 1 drop into the right eye every 8 (eight) hours.  . dorzolamide-timolol (COSOPT) 22.3-6.8 MG/ML ophthalmic solution Place 1 drop into the right eye 2 (two) times daily.  . Loteprednol Etabonate (INVELTYS) 1 % SUSP Apply 1 drop to eye 6 (six) times daily. RIGHT EYE  . prednisoLONE acetate (PRED FORTE) 1 % ophthalmic  suspension Place 1 drop into the right eye 6 (six) times daily.   No current facility-administered medications for this visit.  (Ophthalmic Drugs)   Current Outpatient Medications (Other)  Medication Sig  . acetaminophen (TYLENOL) 500 MG tablet Take 1,000 mg by mouth daily as needed for moderate pain.   . Ascorbic Acid (VITAMIN C) 1000 MG tablet Take 4,000 mg by mouth daily.  Marland Kitchen buPROPion (WELLBUTRIN) 75 MG tablet Take 1 tablet (75 mg total) by mouth 2 (two) times daily.  . cyclobenzaprine (FLEXERIL) 10 MG tablet Take 10 mg by mouth at bedtime.  . diazepam (VALIUM) 5 MG tablet Take 1 tablet (5 mg total) by mouth daily. May take one extra daily if needed  . famotidine (PEPCID) 20 MG tablet Take 20 mg by mouth daily as needed for heartburn.   . levocetirizine (XYZAL) 5 MG tablet Take 5 mg by mouth every evening.  . pantoprazole (PROTONIX) 20 MG tablet Take 1 tablet (20 mg total) by mouth daily.  . propranolol (INDERAL) 20 MG tablet Take 1 tablet (20 mg total) by mouth at bedtime.  . rizatriptan (MAXALT-MLT) 5 MG disintegrating tablet   . sertraline (ZOLOFT) 100 MG tablet Take 1 tablet (100 mg total) by mouth daily.  . sodium chloride (OCEAN) 0.65 % SOLN nasal spray Place 1 spray into both nostrils as needed  for congestion.  . traMADol (ULTRAM) 50 MG tablet Take 50 mg by mouth daily as needed for moderate pain.   . Vitamin D-Vitamin K (VITAMIN K2-VITAMIN D3 PO) Take 2 tablets by mouth daily.   No current facility-administered medications for this visit.  (Other)      REVIEW OF SYSTEMS: ROS    Positive for: Eyes   Negative for: Constitutional, Gastrointestinal, Neurological, Skin, Genitourinary, Musculoskeletal, HENT, Endocrine, Cardiovascular, Respiratory, Psychiatric, Allergic/Imm, Heme/Lymph   Last edited by Zenovia Jordan, LPN on 04/27/6377  5:88 AM. (History)       ALLERGIES Allergies  Allergen Reactions  . Serzone [Nefazodone] Shortness Of Breath    Breathing difficulty  .  Nsaids Other (See Comments)    gastritis    PAST MEDICAL HISTORY Past Medical History:  Diagnosis Date  . Anxiety   . Depression   . GERD (gastroesophageal reflux disease)   . Headache    migraines  . PTSD (post-traumatic stress disorder)   . PTSD (post-traumatic stress disorder)   . Seizures ([facility])    febrile seizure at age 66 months  . Thyroid nodule   . Vaginal infection    Past Surgical History:  Procedure Laterality Date  . [place] VITRECTOMY WITH 20 GAUGE MVR PORT FOR MACULAR HOLE Right 12/10/2017   Procedure: PARS PLANA VITRECTOMY WITH 25 GAUGE LAZER AND GAS MACULAR HOLE membrane pale gas;  Surgeon: Bernarda Caffey, MD;  Location: [facility];  Service: Ophthalmology;  Laterality: Right;  . COLONOSCOPY    . HERNIA REPAIR    . TONSILLECTOMY  2005  . TUBAL LIGATION      FAMILY HISTORY Family History  Problem Relation Age of Onset  . Cataracts Mother   . Cataracts Father   . Cataracts Maternal Grandfather   . Amblyopia Neg Hx   . Blindness Neg Hx   . Glaucoma Neg Hx   . Diabetes Neg Hx   . Macular degeneration Neg Hx   . Retinal detachment Neg Hx   . Strabismus Neg Hx   . Retinitis pigmentosa Neg Hx     SOCIAL HISTORY Social History   Tobacco Use  . Smoking status: Former Smoker    Packs/day: 0.25    Last attempt to quit: 10/20/2014    Years since quitting: 4.1  . Smokeless tobacco: Never Used  Substance Use Topics  . Alcohol use: Yes    Comment: occ  . Drug use: No         OPHTHALMIC EXAM:  Base Eye Exam    Visual Acuity (Snellen - Linear)      Right Left   Dist cc 20/60 20/25   Dist ph cc 20/40 NI       Tonometry (Tonopen, 8:33 AM)      Right Left   Pressure 17 12       Pupils      Dark Light Shape React APD   Right 3  Round Sluggish None   Left 3 2 Round Brisk None       Visual Fields (Counting fingers)      Left Right    Full Full       Extraocular Movement      Right Left    Full, Ortho Full, Ortho        Neuro/Psych    Oriented x3:  Yes   Mood/Affect:  Normal       Dilation    Both eyes:  1.0% Mydriacyl, 2.5% Phenylephrine @ 8:33  AM        Slit Lamp and Fundus Exam    Slit Lamp Exam      Right Left   Lids/Lashes Dermatochalasis - upper lid Dermatochalasis - upper lid   Conjunctiva/Sclera White and quiet White and quiet   Cornea trace endo pigment, 1+ Punctate epithelial erosions Clear   Anterior Chamber Deep and quiet Deep and quiet   Iris Round and dilated, TIDs, focal Posterior synechiae at 1100, Irregular pupil, circumferential TID nasal Round and dilated, Transillumination defects from 0600 to 1100   Lens toric PC IOL with marks at 0630, mild pigment deposits on anterior optic, 1-2+ Posterior capsular opacification PC IOL in good postion with open PC   Vitreous Post vitrectomy, clear Posterior vitreous detachment, vitreous debris and pigment       Fundus Exam      Right Left   Disc Pink and Sharp Pink and Sharp   C/D Ratio 0.1 0.3   Macula Flat, mac hole closed, No heme or edema fla, Good foveal reflex, mild Retinal pigment epithelial mottling, No heme or edema   Vessels Mild Vascular attenuation Mild Vascular attenuation   Periphery attached; excellent laser 360 and around inferior retinal breaks ragged Horseshoe tear spanning from 1130 to 0130 with good surrounding laser scars, round area of chorioretinal atrophy at 0700          IMAGING AND PROCEDURES  Imaging and Procedures for 02/25/18  OCT, Retina - OU - Both Eyes       Right Eye Quality was good. Central Foveal Thickness: 335. Progression has been stable. Findings include abnormal foveal contour, no SRF, no IRF (mild ellipsoid thinning, macular hole closed).   Left Eye Quality was good. Central Foveal Thickness: 281. Progression has been stable. Findings include normal foveal contour, no IRF, no SRF, epiretinal membrane.   Notes *Images captured and stored on drive  Diagnosis / Impression:  OD: FTMH  closed with mild ellipsoid thinning OS: mild ERM  Clinical management:  See below  Abbreviations: NFP - Normal foveal profile. CME - cystoid macular edema. PED - pigment epithelial detachment. IRF - intraretinal fluid. SRF - subretinal fluid. EZ - ellipsoid zone. ERM - epiretinal membrane. ORA - outer retinal atrophy. ORT - outer retinal tubulation. SRHM - subretinal hyper-reflective material                  ASSESSMENT/PLAN:    ICD-10-CM   1. Macular hole of right eye H35.341   2. 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 Retinal edema H35.81 OCT, Retina - OU - Both Eyes  4. Pseudophakia of both eyes Z96.1   5. History of repair of retinal tear by laser photocoagulation Z98.890     1. Full thickness macular hole, OD - s/p 25g PPV/ICG/MP/EL/FAX/14% C3F8 OD, 2.14.19  - s/p removal of gas bubble retained in AC, 03.06.19  - macular hole closed; mild pucker on OCT             - IOP stable at 17 today - f/u 6 months  2. Retinal tears OD-  - s/p laser retinopexy to inferotemporal tears on 2.12.19 - multiple additional tears noted intraoperatively and treated with endolaser on 2.14.19 - laser looks good - monitor  3. Retinal edema-  - pre-op: +CME surrounding macular hole  4. Pseudophakia OU  - s/p CE/IOL OU  - OD by Dr. Chauncey Cruel. Groat (09/2017) toric lens in good postion  - mild PCO OD --  may benefit from YAG capsulotomy -- will send letter to Dr. Katy Fitch  - monitor  5. History of RT with laser retinopexy OS - large, jagged retinal tear superiorly OS with good laser surrounding - however she does have a lot of vitreous debris and floaters, which may be contributing to her complaints of haze and glare following cataract surgery - BCVA OS remains 20/25  - stable   Ophthalmic Meds Ordered this visit:  No orders of the defined types were placed in this encounter.     Return in about 6 months (around 06/10/2019) for f/u [facility] OD, DFE, OCT.  There are  no Patient Instructions on file for this visit.   Explained the diagnoses, plan, and follow up with the patient and they expressed understanding.  Patient expressed understanding of the importance of proper follow up care.   This document serves as a record of services personally performed by Gardiner Sleeper, MD, PhD. It was created on their behalf by Ernest Mallick, OA, an ophthalmic assistant. The creation of this record is the provider's dictation and/or activities during the visit.    Electronically signed by: Ernest Mallick, OA  02.12.2020 9:32 AM    Gardiner Sleeper, M.D., Ph.D. Diseases & Surgery of the Retina and Vitreous [facility]   I have reviewed the above documentation for accuracy and completeness, and I agree with the above. Gardiner Sleeper, M.D., Ph.D. 12/10/18 9:33 AM    Abbreviations: M myopia (nearsighted); A astigmatism; H hyperopia (farsighted); P presbyopia; Mrx spectacle prescription;  CTL contact lenses; OD right eye; OS left eye; OU both eyes  XT exotropia; ET esotropia; PEK punctate epithelial keratitis; PEE punctate epithelial erosions; DES dry eye syndrome; MGD meibomian gland dysfunction; ATs artificial tears; PFAT's preservative free artificial tears; [place] nuclear sclerotic cataract; PSC posterior subcapsular cataract; ERM epi-retinal membrane; PVD posterior vitreous detachment; RD retinal detachment; DM diabetes mellitus; DR diabetic retinopathy; NPDR non-proliferative diabetic retinopathy; PDR proliferative diabetic retinopathy; CSME clinically significant macular edema; DME diabetic macular edema; dbh dot blot hemorrhages; CWS cotton wool spot; POAG primary open angle glaucoma; C/D cup-to-disc ratio; HVF humphrey visual field; GVF goldmann visual field; OCT optical coherence tomography; IOP intraocular pressure; BRVO Branch retinal vein occlusion; CRVO central retinal vein occlusion; CRAO central retinal artery occlusion; BRAO branch retinal  artery occlusion; RT retinal tear; SB scleral buckle; PPV pars plana vitrectomy; VH Vitreous hemorrhage; PRP panretinal laser photocoagulation; IVK intravitreal kenalog; VMT vitreomacular traction; MH Macular hole;  NVD neovascularization of the disc; NVE neovascularization elsewhere; AREDS age related eye disease study; ARMD age related macular degeneration; POAG primary open angle glaucoma; EBMD epithelial/anterior basement membrane dystrophy; ACIOL anterior chamber intraocular lens; IOL intraocular lens; PCIOL posterior chamber intraocular lens; Phaco/IOL phacoemulsification with intraocular lens placement; [place] photorefractive keratectomy; LASIK laser assisted in situ keratomileusis; HTN hypertension; DM diabetes mellitus; COPD chronic obstructive pulmonary disease

## 2018-12-10 ENCOUNTER — Encounter (INDEPENDENT_AMBULATORY_CARE_PROVIDER_SITE_OTHER): Payer: Self-pay | Admitting: Ophthalmology

## 2018-12-10 ENCOUNTER — Ambulatory Visit (INDEPENDENT_AMBULATORY_CARE_PROVIDER_SITE_OTHER): Payer: 59 | Admitting: Ophthalmology

## 2018-12-10 DIAGNOSIS — H3581 Retinal edema: Secondary | ICD-10-CM | POA: Diagnosis not present

## 2018-12-10 DIAGNOSIS — H33331 Multiple defects of retina without detachment, right eye: Secondary | ICD-10-CM

## 2018-12-10 DIAGNOSIS — Z961 Presence of intraocular lens: Secondary | ICD-10-CM | POA: Diagnosis not present

## 2018-12-10 DIAGNOSIS — H35341 Macular cyst, hole, or pseudohole, right eye: Secondary | ICD-10-CM | POA: Diagnosis not present

## 2018-12-10 DIAGNOSIS — Z9889 Other specified postprocedural states: Secondary | ICD-10-CM

## 2018-12-27 ENCOUNTER — Ambulatory Visit (INDEPENDENT_AMBULATORY_CARE_PROVIDER_SITE_OTHER): Payer: 59 | Admitting: Psychiatry

## 2018-12-27 ENCOUNTER — Encounter (HOSPITAL_COMMUNITY): Payer: Self-pay | Admitting: Psychiatry

## 2018-12-27 DIAGNOSIS — F411 Generalized anxiety disorder: Secondary | ICD-10-CM | POA: Diagnosis not present

## 2018-12-27 DIAGNOSIS — F330 Major depressive disorder, recurrent, mild: Secondary | ICD-10-CM | POA: Diagnosis not present

## 2018-12-27 MED ORDER — LAMOTRIGINE 25 MG PO TABS: ORAL_TABLET | ORAL | 1 refills | Status: DC

## 2018-12-27 MED ORDER — SERTRALINE HCL 100 MG PO TABS: 150.0000 mg | ORAL_TABLET | Freq: Every day | ORAL | 1 refills | Status: DC

## 2018-12-27 MED ORDER — DIAZEPAM 5 MG PO TABS: 5.0000 mg | ORAL_TABLET | Freq: Every day | ORAL | 1 refills | Status: DC

## 2018-12-27 MED ORDER — PROPRANOLOL HCL 20 MG PO TABS: 20.0000 mg | ORAL_TABLET | Freq: Every day | ORAL | 0 refills | Status: DC
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
## 2019-01-17 ENCOUNTER — Telehealth (HOSPITAL_COMMUNITY): Payer: Self-pay

## 2019-01-17 DIAGNOSIS — F411 Generalized anxiety disorder: Secondary | ICD-10-CM

## 2019-01-17 DIAGNOSIS — F330 Major depressive disorder, recurrent, mild: Secondary | ICD-10-CM

## 2019-01-17 NOTE — Telephone Encounter (Signed)
Patient is calling to request that I call the pharmacy so that she can get her refill of Valium earlier. Patient admits to taking 2 times a day almost every day not as needed and now she can not refill because it is too early. 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 Please review and advise, thank you

## 2019-01-18 MED ORDER — DIAZEPAM 5 MG PO TABS: 5.0000 mg | ORAL_TABLET | Freq: Every day | ORAL | 1 refills | Status: DC

## 2019-01-18 MED ORDER — LAMOTRIGINE 25 MG PO TABS: ORAL_TABLET | ORAL | 1 refills | Status: DC

## 2019-01-18 NOTE — Telephone Encounter (Signed)
Please inform patient.  I called pharmacy she can take Valium 5 mg twice a day.  I also called pharmacy to take Lamictal 75 mg daily if she does not have any rash from the Lamictal.  She need to monitor closely if she develop a rash then she need to stop the Lamictal immediately.  If patient continues to have anxiety then she should consider Klonopin instead of Valium which we discussed on her last visit.

## 2019-01-19 NOTE — Telephone Encounter (Signed)
I called the patient and left a voicemail letting her know 

## 2019-02-05 ENCOUNTER — Other Ambulatory Visit (HOSPITAL_COMMUNITY): Payer: Self-pay | Admitting: Psychiatry

## 2019-02-05 DIAGNOSIS — F330 Major depressive disorder, recurrent, mild: Secondary | ICD-10-CM

## 2019-02-08 ENCOUNTER — Telehealth (HOSPITAL_COMMUNITY): Payer: Self-pay

## 2019-02-08 NOTE — Telephone Encounter (Signed)
I returned patient's phone call.  She appears emotional on the phone.  She told yesterday she got into trouble because she offered to pray to patient.  She admitted not started the Lamictal because she is not sure if it will help.  Patient endorsed that staff is picking on her and she is afraid to ask FMLA.  Reassurance given.  Recommend to restart Lamictal and reminded about the rash in that case she need to stop the medication.  I offer that she may take 2-week time off until the medicine and her stress level get better.  Patient told she will call back if she decided to pursue for FMLA but agreed to start Lamictal.  I recommend if symptoms get worse then she should call us.

## 2019-02-08 NOTE — Telephone Encounter (Signed)
Patient called me this morning, she is severely depressed, crying, and feeling isolated. Patient is wanting to take some time off of work. Patient states she got in trouble yesterday for offering to pray for a patient and she is concerned that she will get in trouble for asking for FMLA papers. She would like a call from you - [phone] -

## 2019-02-09 NOTE — Telephone Encounter (Signed)
Patient called back this morning and stated that she wanted to take off 2 months not 2 weeks. Please review and advise, thank you

## 2019-02-09 NOTE — Telephone Encounter (Signed)
I called patient back to let her know and sent email to Sandra Herring. Stafford Sink is going to call the patient and see if she qualifies for IOP and if her insurance will cover

## 2019-02-09 NOTE — Telephone Encounter (Signed)
She should try 2 weeks first and IOP.  We will reevaluate if she needs more time off.

## 2019-02-10 ENCOUNTER — Telehealth (HOSPITAL_COMMUNITY): Payer: Self-pay | Admitting: Psychiatry

## 2019-02-10 NOTE — Telephone Encounter (Signed)
D:  Dr. Lolly Mustache referred pt to MH-IOP.  A:  Placed call to pt to orient her to MH-IOP.  Pt had numerous questions about the cost.  Encouraged pt to call her insurance company for her benefits.  Writer will call pt later this afternoon to do the CCA.  Inform Dr. Lolly Mustache.  R:  Pt receptive.

## 2019-02-11 ENCOUNTER — Telehealth (HOSPITAL_COMMUNITY): Payer: Self-pay

## 2019-02-11 ENCOUNTER — Other Ambulatory Visit (HOSPITAL_COMMUNITY): Payer: 59

## 2019-02-11 ENCOUNTER — Other Ambulatory Visit: Payer: Self-pay

## 2019-02-11 ENCOUNTER — Encounter (HOSPITAL_COMMUNITY): Payer: Self-pay | Admitting: Psychiatry

## 2019-02-11 ENCOUNTER — Other Ambulatory Visit (HOSPITAL_COMMUNITY): Payer: 59 | Attending: Psychiatry | Admitting: Family

## 2019-02-11 DIAGNOSIS — F4310 Post-traumatic stress disorder, unspecified: Secondary | ICD-10-CM | POA: Insufficient documentation

## 2019-02-11 DIAGNOSIS — G479 Sleep disorder, unspecified: Secondary | ICD-10-CM | POA: Insufficient documentation

## 2019-02-11 DIAGNOSIS — F411 Generalized anxiety disorder: Secondary | ICD-10-CM

## 2019-02-11 DIAGNOSIS — F333 Major depressive disorder, recurrent, severe with psychotic symptoms: Secondary | ICD-10-CM | POA: Insufficient documentation

## 2019-02-11 DIAGNOSIS — F330 Major depressive disorder, recurrent, mild: Secondary | ICD-10-CM

## 2019-02-11 DIAGNOSIS — Z79899 Other long term (current) drug therapy: Secondary | ICD-10-CM | POA: Insufficient documentation

## 2019-02-11 DIAGNOSIS — F17210 Nicotine dependence, cigarettes, uncomplicated: Secondary | ICD-10-CM | POA: Insufficient documentation

## 2019-02-11 DIAGNOSIS — K219 Gastro-esophageal reflux disease without esophagitis: Secondary | ICD-10-CM | POA: Insufficient documentation
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
## 2019-02-13 ENCOUNTER — Encounter (HOSPITAL_COMMUNITY): Payer: Self-pay | Admitting: Family

## 2019-02-14 ENCOUNTER — Encounter (HOSPITAL_COMMUNITY): Payer: Self-pay | Admitting: Family

## 2019-02-14 ENCOUNTER — Other Ambulatory Visit: Payer: Self-pay

## 2019-02-14 ENCOUNTER — Ambulatory Visit (HOSPITAL_COMMUNITY): Payer: 59

## 2019-02-14 ENCOUNTER — Other Ambulatory Visit (HOSPITAL_COMMUNITY): Payer: 59

## 2019-02-14 ENCOUNTER — Other Ambulatory Visit (HOSPITAL_COMMUNITY): Payer: 59 | Admitting: Licensed Clinical Social Worker

## 2019-02-14 DIAGNOSIS — G479 Sleep disorder, unspecified: Secondary | ICD-10-CM | POA: Diagnosis not present

## 2019-02-14 DIAGNOSIS — F4310 Post-traumatic stress disorder, unspecified: Secondary | ICD-10-CM

## 2019-02-14 DIAGNOSIS — Z79899 Other long term (current) drug therapy: Secondary | ICD-10-CM | POA: Diagnosis not present

## 2019-02-14 DIAGNOSIS — F411 Generalized anxiety disorder: Secondary | ICD-10-CM

## 2019-02-14 DIAGNOSIS — K219 Gastro-esophageal reflux disease without esophagitis: Secondary | ICD-10-CM | POA: Diagnosis not present

## 2019-02-14 DIAGNOSIS — F333 Major depressive disorder, recurrent, severe with psychotic symptoms: Secondary | ICD-10-CM

## 2019-02-14 DIAGNOSIS — F17210 Nicotine dependence, cigarettes, uncomplicated: Secondary | ICD-10-CM | POA: Diagnosis not present

## 2019-02-14 DIAGNOSIS — F330 Major depressive disorder, recurrent, mild: Secondary | ICD-10-CM
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
## 2019-02-14 NOTE — Psych (Signed)
Virtual Visit via Video Note  I connected with Sandra Herring on 02/14/19 at  9:00 AM EDT by a video enabled telemedicine application and verified that I am speaking with the correct person using two identifiers.   I discussed the limitations of evaluation and management by telemedicine and the availability of in person appointments. The patient expressed understanding and agreed to proceed.   I discussed the assessment and treatment plan with the patient. The patient was provided an opportunity to ask questions and all were answered. The patient agreed with the plan and demonstrated an understanding of the instructions.   The patient was advised to call back or seek an in-person evaluation if the symptoms worsen or if the condition fails to improve as anticipated.  I provided 15 minutes of non-face-to-face time during this encounter.   Cln and pt spent time discussing treatment plan. Pt verbally agrees to treatment plan.  Kandyce Dieguez Lauro Franklin, LCMHCA, LCASA

## 2019-02-15 ENCOUNTER — Other Ambulatory Visit: Payer: Self-pay

## 2019-02-15 ENCOUNTER — Ambulatory Visit (HOSPITAL_COMMUNITY): Payer: 59

## 2019-02-15 ENCOUNTER — Other Ambulatory Visit (HOSPITAL_COMMUNITY): Payer: 59 | Admitting: Occupational Therapy

## 2019-02-15 ENCOUNTER — Encounter (HOSPITAL_COMMUNITY): Payer: Self-pay

## 2019-02-15 ENCOUNTER — Other Ambulatory Visit (HOSPITAL_COMMUNITY): Payer: 59 | Admitting: Licensed Clinical Social Worker

## 2019-02-15 DIAGNOSIS — F4310 Post-traumatic stress disorder, unspecified: Secondary | ICD-10-CM

## 2019-02-15 DIAGNOSIS — F333 Major depressive disorder, recurrent, severe with psychotic symptoms: Secondary | ICD-10-CM

## 2019-02-15 DIAGNOSIS — F411 Generalized anxiety disorder: Secondary | ICD-10-CM

## 2019-02-15 DIAGNOSIS — R4589 Other symptoms and signs involving emotional state: Secondary | ICD-10-CM
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
## 2019-02-16 ENCOUNTER — Other Ambulatory Visit (HOSPITAL_COMMUNITY): Payer: 59 | Admitting: Licensed Clinical Social Worker

## 2019-02-16 ENCOUNTER — Ambulatory Visit (HOSPITAL_COMMUNITY): Payer: 59

## 2019-02-16 ENCOUNTER — Encounter (HOSPITAL_COMMUNITY): Payer: Self-pay

## 2019-02-16 ENCOUNTER — Other Ambulatory Visit (HOSPITAL_COMMUNITY): Payer: 59 | Admitting: Occupational Therapy

## 2019-02-16 DIAGNOSIS — F333 Major depressive disorder, recurrent, severe with psychotic symptoms: Secondary | ICD-10-CM

## 2019-02-16 DIAGNOSIS — F411 Generalized anxiety disorder: Secondary | ICD-10-CM

## 2019-02-16 DIAGNOSIS — F4310 Post-traumatic stress disorder, unspecified: Secondary | ICD-10-CM

## 2019-02-16 DIAGNOSIS — R4589 Other symptoms and signs involving emotional state: Secondary | ICD-10-CM

## 2019-02-16 NOTE — Progress Notes (Addendum)
Pt attended spiritual care group 02/16/2019 11:00-12:00.  Group met via web-ex due to COVID-19 precautions.  Group facilitated by Simone Curia, MDiv, [place]   Group focused on topic of "community."  Members reflected on topic in facilitated dialog, identifying responses to topic and notions they hold of community from their previous experience.  Group members utilized value sort cards to identify top qualities they look for in community.  Engaged in facilitated dialog around their value choices, noting origin of these values, how these are realized in their lives, and strategies for engaging these values.    Spiritual care group drew on Narrative and Adlerian modalities.   Sandra Herring was present throughout group.  Appeared slightly pressured and could fixate on topic - especially related to spirituality - during conversation, but redirectable.  Noted that much of her community is through online / social media.  Stated she works to keep boundaries around this community.  Expressed hopefulness that the current COVID crisis is helping awaken people to ways in which they are not in community with one another.  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    Jerene Pitch, MDiv, [facility]
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
## 2019-02-17 ENCOUNTER — Other Ambulatory Visit (HOSPITAL_COMMUNITY): Payer: 59 | Admitting: Licensed Clinical Social Worker

## 2019-02-17 ENCOUNTER — Ambulatory Visit (HOSPITAL_COMMUNITY): Payer: 59

## 2019-02-17 ENCOUNTER — Telehealth (HOSPITAL_COMMUNITY): Payer: Self-pay | Admitting: Professional

## 2019-02-17 ENCOUNTER — Other Ambulatory Visit (HOSPITAL_COMMUNITY): Payer: 59 | Admitting: Occupational Therapy

## 2019-02-17 ENCOUNTER — Other Ambulatory Visit: Payer: Self-pay

## 2019-02-17 ENCOUNTER — Encounter (HOSPITAL_COMMUNITY): Payer: Self-pay | Admitting: Occupational Therapy

## 2019-02-17 DIAGNOSIS — F333 Major depressive disorder, recurrent, severe with psychotic symptoms: Secondary | ICD-10-CM

## 2019-02-17 DIAGNOSIS — R4589 Other symptoms and signs involving emotional state: Secondary | ICD-10-CM

## 2019-02-17 DIAGNOSIS — F411 Generalized anxiety disorder: Secondary | ICD-10-CM

## 2019-02-17 DIAGNOSIS — F4310 Post-traumatic stress disorder, unspecified: Secondary | ICD-10-CM

## 2019-02-17 NOTE — Psych (Signed)
Virtual Visit via Video Note  I connected with Sandra Herring on 02/17/19 at  9:00 AM EDT by a video enabled telemedicine application and verified that I am speaking with the correct person using two identifiers.   I discussed the limitations of evaluation and management by telemedicine and the availability of in person appointments. The patient expressed understanding and agreed to proceed.   I discussed the assessment and treatment plan with the patient. The patient was provided an opportunity to ask questions and all were answered. The patient agreed with the plan and demonstrated an understanding of the instructions.   The patient was advised to call back or seek an in-person evaluation if the symptoms worsen or if the condition fails to improve as anticipated.  Pt was provided 240 minutes of non-face-to-face time during this encounter.   Donia GuilesJenny Avani Sensabaugh, LCSW      [facility] THERAPIST PROGRESS [facility]  Sandra Herring [id number]  Session Time: 9:00 - 10:00  Participation Level: Active  Behavioral Response: CasualAlertDepressed  Type of Therapy: Group Therapy  Treatment Goals addressed: Coping  Interventions: CBT, DBT, Supportive and Reframing  Summary: Clinician led check-in regarding current stressors and situation, and review of patient completed daily inventory. Clinician utilized active listening and empathetic response and validated patient emotions. Clinician facilitated processing group on pertinent issues.   Therapist Response: Sandra Herring is a 58 y.o. female who presents with depression, anxiety, trauma, and delusional symptoms. Patient arrived within time allowed and reports that she is feeling  "ok mood wise." Patient rates her mood at a 4 on a scale of 1-10 with 10 being great. Pt reports she has been feeling nauseous since dinner last night and thinks she got food poisoning. Pt states she had a good evening prior to that and was able to celebrate with  her daughter and they had dinner and watched a movie together. Pt reports she received a certified letter from her boss stating that her "performance was questionable" prior to her leave and that it would be addressed when she returned. Pt states this made it clear to her that she does not want to return to that environment. Pt presents as more focused. Patient engaged in discussion.     Session Time: 10:00 -11:00  Participation Level: Active  Behavioral Response: CasualAlertDepressed  Type of Therapy: Group Therapy, psychoeducation, psychotherapy  Treatment Goals addressed: Coping  Interventions: CBT, DBT, Solution Focused, Supportive and Reframing  Summary: Cln led discussion on how to find power within an uncontrollable situation. Group discussed situations in their life that feel uncontrollable and brainstormed ways to regain agency in those situations.    Therapist Response: Pt shares she feels out of control in her work environment. Pt states that since being in group she has realized that her values are in conflict with her job. Pt reports she does not agree with [place] and that working in an environment which does not value other holistic methods is "killing" her. 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     Session Time: 11:00 - 12:00   Participation Level:  Active   Behavioral Response: CasualAlertDepressed   Type of Therapy: Group Therapy, OT   Treatment Goals addressed: Coping   Interventions: Psychosocial skills training, Supportive,    Summary:  Occupational Therapy group    Therapist Response: Patient engaged in group. See OT note.        Session Time: 12:00- 1:00  Participation Level: Active  Behavioral Response: CasualAlertDepressed  Type of Therapy: Group Therapy, Psychoeducation; Psychotherapy  Treatment Goals addressed: Coping  Interventions: CBT; Solution focused; Supportive; Reframing  Summary: 12:00 - 12:50 Clinician introduced topic of cognitive distortions. Cln educated on what cognitive distortions are and how they affect [place]. Group began review of cognitive distortion handout and came up with examples to work on noticing distortions as they occur.   12:50 -1:00 Clinician led check-out. Clinician assessed for immediate needs, medication compliance and efficacy, and safety concerns   Therapist Response: Pt reports understanding the distortions discussed, however lacks insight into how they present in her life.   At check-out, patient rates her mood at a 6 on a scale of 1-10 with 10 being great. Patient reports afternoon plans of setting up her new tablet, painting, and taking a nap. Patient demonstrates some progress as evidenced by recognizing her values are not aligned with her actions in terms of work. Patient denies SI/HI/self-harm at the end of group.    Suicidal/Homicidal: Nowithout intent/plan   Plan: Pt will continue in PHP while working to decrease symptoms and increase ability to manage symptoms in a healthy manner.    Diagnosis: MDD (major depressive disorder), recurrent, severe, with psychosis (HCC) [F33.3]    1. MDD (major depressive disorder), recurrent, severe, with psychosis (HCC)   2. GAD (generalized anxiety disorder)   3. PTSD (post-traumatic stress disorder)       [name], LCSW 02/17/2019

## 2019-02-17 NOTE — Psych (Signed)
Virtual Visit via Video Note  I connected with Sandra Herring on 02/16/19 at  9:00 AM EDT by a video enabled telemedicine application and verified that I am speaking with the correct person using two identifiers.   I discussed the limitations of evaluation and management by telemedicine and the availability of in person appointments. The patient expressed understanding and agreed to proceed.   I discussed the assessment and treatment plan with the patient. The patient was provided an opportunity to ask questions and all were answered. The patient agreed with the plan and demonstrated an understanding of the instructions.   The patient was advised to call back or seek an in-person evaluation if the symptoms worsen or if the condition fails to improve as anticipated.  Pt was provided 240 minutes of non-face-to-face time during this encounter.   [name], LCSW      [facility] BH PHP THERAPIST PROGRESS NOTE  Sandra Herring [id number]  Session Time: 9:00 - 10:00  Participation Level: Active  Behavioral Response: CasualAlertDepressed  Type of Therapy: Group Therapy  Treatment Goals addressed: Coping  Interventions: CBT, DBT, Supportive and Reframing  Summary: Clinician led check-in regarding current stressors and situation, and review of patient completed daily inventory. Clinician utilized active listening and empathetic response and validated patient emotions. Clinician facilitated processing group on pertinent issues.   Therapist Response: Sandra Herring is a 58 y.o. female who presents with depression, anxiety, trauma, and delusional symptoms. Patient arrived within time allowed and reports that she is feeling "pretty good today." Patient rates her mood at a 7 on a scale of 1-10 with 10 being great. Pt reports yesterday she completed one of her goals of vaccuming which she was pleased about. Pt states she ran errands, made dinner, and spoke to her son. Pt shares her son  decided to represent himself in the court room and requested a trial by jury so will remain in custody until September/October. Pt reports she is proud of him for using his "brain for good" and feels hopeful for his future. Pt notes that she is sad she cannot talk to her daughter about her son, but her daughter does not allow it and pt has to allow her "to manage her own reality." Cln commented that it was kind of pt to respect her daughter's boundary and pt became agitated and loud. Pt states "kind? That is kind? Why would that be kind?" Cln responded that she believes respecting a person's boundaries is a kind thing to do. Pt remains elevated in emotion and tone, and indicates that she does not like her daughter's boundaries and feels she is "burying her head in the sand." After listing ways she believes her daughter to be wrong, pt quickly changed demeanor, her tone softer and more gentle, and states "I guess I am struggling taking the compliment." Pt continues to present with scattered attention and disconnected behaviors. Patient engaged in discussion.     Session Time: 10:00 -11:00  Participation Level:Active  Behavioral Response:CasualAlertDepressed  Type of Therapy: Group Therapy, OT  Treatment Goals addressed: Coping  Interventions:Psychosocial skills training, Supportive,   Summary:Occupational Therapy group  Therapist Response: Patient engaged in group. See OT note.     Session Time: 11:00 -12:00  Participation Level:Active  Behavioral Response:CasualAlertDepressed  Type of Therapy: Group Therapy, psychotherapy  Treatment Goals addressed: Coping  Interventions:Strengths based, reframing, Supportive,   Summary:Spiritual Care group  Therapist Response: Patient engaged in group. See chaplain note.     Session  Time: 12:00- 1:00  Participation Level:Active  Behavioral Response:CasualAlertDepressed  Type of Therapy: Group  Therapy, Psychoeducation  Treatment Goals addressed: Coping  Interventions:relaxation training; Supportive; Reframing  Summary:12:00 - 12:50: Relaxation group: Cln led group focused on retraining the body's response to stress.  12:50 -1:00 Clinician led check-out. Clinician assessed for immediate needs, medication compliance and efficacy, and safety concerns  Therapist Response: Patient engaged in activity and discussion. At check-out, patient rates her mood at a 8 on a scale of 1-10 with 10 being great. Patient reports afternoon plansof visiting her daughter for daughter's birthday. Patient demonstrates some progress as evidenced by responding well to redirection at one point in group. Patient denies SI/HI/self-harm thoughts at the end of group.    Suicidal/Homicidal: Nowithout intent/plan   Plan: Pt will continue in PHP while working to decrease symptoms and increase ability to manage symptoms in a healthy manner.    Diagnosis: MDD (major depressive disorder), recurrent, severe, with psychosis (HCC) [F33.3]    1. MDD (major depressive disorder), recurrent, severe, with psychosis (HCC)   2. GAD (generalized anxiety disorder)   3. PTSD (post-traumatic stress disorder)       Donia GuilesJenny Reiko Vinje, LCSW 02/17/2019
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
## 2019-02-17 NOTE — Telephone Encounter (Signed)
9:15-9:42: Cln called pt to help her access WebEx group for PHP. Pt reports she has not received an email to join group. Cln sends 4th email and pt receives. Cln stayed on the phone to make sure pt would be able to access group using the link. Pt was unable to do so and reports "it's not working. I need an access code and password or something." Pt says "Please God help me get into this group. It's what's going to save my life. Please help me with your will."Abruptly, pt states "Have you filled out my paperwork to be off work yet?" Cln responds "no, I have not but it will be completed within the next few days and faxed. I'll let you know when that is completed. Let me read off this access code for you." Cln reads out access code over the phone and pt is unable to access group. Cln forwards access code to pt to make sure code was entered correctly; pt still unable to access. Cln tells pt she is unsure what further steps to take and will reach out for other IT support; pt responds with "please don't hang up. I need to delete some art projects from my phone so there is room and I can get the app. 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 I'm trying to do the right thing and I know I can't make them happy but I need them to understand me. Can you do that? Help them understand me?" Cln tells pt this cln will be unable to talk with her job. Pt goes back to discribing her art projects she is deleting in order to have room to download the WebEx app. Pt reports "It's still not working!" Cln reports understanding of the frustration and shares that this cln is not Administrator, sports" and will reach out to [facility], OT, to call pt to further assist pt due to this cln needing to start an  assessment with another pt. Pt reports understanding.  Cln called Dalphine Handing and explained what was going on. Lisette Abu reported understanding and called pt.  Lisette Abu reports pt answered the phone, stated "I got it to work." and hung up. Kaylee informed this cln; checked WebEx group and pt was in the group at that time.

## 2019-02-17 NOTE — Psych (Signed)
Virtual Visit via Video Note  I connected with Sandra CockayneLaurie A Olden on 02/14/19 at  9:00 AM EDT by a video enabled telemedicine application and verified that I am speaking with the correct person using two identifiers.   I discussed the limitations of evaluation and management by telemedicine and the availability of in person appointments. The patient expressed understanding and agreed to proceed.   I discussed the assessment and treatment plan with the patient. The patient was provided an opportunity to ask questions and all were answered. The patient agreed with the plan and demonstrated an understanding of the instructions.   The patient was advised to call back or seek an in-person evaluation if the symptoms worsen or if the condition fails to improve as anticipated.  Pt was provided 240 minutes of non-face-to-face time during this encounter.   Donia GuilesJenny Nikolis Berent, LCSW      [facility] PHP THERAPIST PROGRESS NOTE  Sandra CockayneLaurie A Poinsett [id number]  Session Time: 9:00 - 10:00  Participation Level: Active  Behavioral Response: CasualAlertDepressed  Type of Therapy: Group Therapy  Treatment Goals addressed: Coping  Interventions: CBT, DBT, Supportive and Reframing  Summary: Clinician led check-in regarding current stressors and situation, and review of patient completed daily inventory. Clinician utilized active listening and empathetic response and validated patient emotions. Clinician facilitated processing group on pertinent issues.   Therapist Response: Sandra Herring is a 58 y.o. female who presents with depression, anxiety, trauma, and delusional symptoms. Patient arrived within time allowed and reports that she is feeling "pretty anxious." Patient rates her mood at a 5 on a scale of 1-10 with 10 being great. Pt reports she struggled with technology this morning and it increased her anxiety about making it to her first day of group. Pt struggled with fixations during this time and  was difficult to redirect. Pt reports she spent her weekend making art, completing 500-600 pieces, and watching YouTube on health related topics. Pt states the "health crisis" was a large factor in her increased depression and that watching YouTube videos makes her feel "empowered" as she learns what foods and herbs will protect her "from all sickness."  Pt struggled with tangential and group interfering comments throughout session. Patient engaged in discussion.     Session Time: 10:00 -11:00  Participation Level: Active  Behavioral Response: CasualAlertDepressed  Type of Therapy: Group Therapy, psychoeducation, psychotherapy  Treatment Goals addressed: Coping  Interventions: CBT, DBT, Solution Focused, Supportive and Reframing  Summary: Cln led discussion on anger and how it can manifest for every person differently. Cln discussed anger as a place holder emotion that may be masking more difficult feelings. Group shared ways in which anger presents in their lives.   Therapist Response: Patient did not share how her anger presents. Pt went on tangents about her artwork and her son being in jail. Pt was again tangential, with little awareness and difficult to redirect.     Session Time: 11:00 - 12:00   Participation Level: Active   Behavioral Response: CasualAlertDepressed   Type of Therapy: Group Therapy, OT   Treatment Goals addressed: Coping   Interventions: Psychosocial skills training, Supportive,    Summary:  Occupational Therapy group   Therapist Response: Patient engaged in group. See OT note.        Session Time: 12:00- 1:00  Participation Level: Active  Behavioral Response: CasualAlertDepressed  Type of Therapy: Group Therapy, Psychoeducation; Psychotherapy  Treatment Goals addressed: Coping  Interventions: CBT; Solution focused; Supportive; Reframing  Summary: 12:00 -  12:50 Cln continued topic of distress tolerance skills and led review of skills  previously discussed. Cln introduced distraction skills and why distractions can be useful when we are emotional. Cln began education on ACCEPTS skills and group discussed how the skills could be utilized in their own lives. 12:50 -1:00 Clinician led check-out. Clinician assessed for immediate needs, medication compliance and efficacy, and safety concerns   Therapist Response: Pt reports understanding of skill and reports they will likely not help her and what she needs most are "tools to learn to cope." Cln reminded pt that distress tolerance skills are exactly that, and pt states that she needs skills for work so these will not be effective. When asked why, pt shared that her coworkers want her to "make them feel good" which is an impossible task.  At check-out, patient rates her mood at a 6 on a scale of 1-10 with 10 being great. Patient reports afternoon plans of following up on paperwork, showering, and eating dinner. Patient demonstrates some progress as evidenced by participating in first group session. Patient denies SI/HI/self-harm at the end of group.    Suicidal/Homicidal: Nowithout intent/plan   Plan: Pt will continue in PHP while working to decrease symptoms and increase ability to manage symptoms in a healthy manner.    Diagnosis: Severe episode of recurrent major depressive disorder, with psychotic features (HCC) [F33.3]    1. Severe episode of recurrent major depressive disorder, with psychotic features (HCC)   2. MDD (major depressive disorder), recurrent episode, mild (HCC)   3. GAD (generalized anxiety disorder)   4. PTSD (post-traumatic stress disorder)       [name], LCSW 02/17/2019

## 2019-02-17 NOTE — Therapy (Signed)
[facility] [address] [place], [place], [id number] Phone: [phone]   Fax:  [phone]  Occupational Therapy Treatment  Patient Details  Name: Sandra Herring MRN: [id number] Date of Birth: [birth year] Referring Provider (OT): Hillery Jacksanika Lewis, NP  Virtual Visit via Video Note  I connected with Sandra Herring on 02/17/19 at  8:00 AM EDT by a video enabled telemedicine application and verified that I am speaking with the correct person using two identifiers.   I discussed the limitations of evaluation and management by telemedicine and the availability of in person appointments. The patient expressed understanding and agreed to proceed.   I discussed the assessment and treatment plan with the patient. The patient was provided an opportunity to ask questions and all were answered. The patient agreed with the plan and demonstrated an understanding of the instructions.   The patient was advised to call back or seek an in-person evaluation if the symptoms worsen or if the condition fails to improve as anticipated.  I provided 60 minutes of non-face-to-face time during this encounter.  Dalphine HandingKaylee Freddi Forster, MSOT, OTR/L Behavioral Health OT/ Acute Relief OT PHP Office: [phone]  Dalphine HandingKaylee [name] Cannaday, [place]    Encounter Date: 02/17/2019  OT End of Session - 02/17/19 1347    Visit Number  3    Number of Visits  16    Date for OT Re-Evaluation  03/15/19    Authorization Type  [employer]    OT Start Time  1100    OT Stop Time  1200    OT Time Calculation (min)  60 min    Activity Tolerance  Patient tolerated treatment well    Behavior During Therapy  [facility] for tasks assessed/performed       Past Medical History:  Diagnosis Date  . Anxiety   . Depression   . GERD (gastroesophageal reflux disease)   . Headache    migraines  . PTSD (post-traumatic stress disorder)   . PTSD (post-traumatic stress disorder)   . Seizures (HCC)    febrile seizure at age 57 months  . Thyroid nodule   . Vaginal infection     Past Surgical History:  Procedure Laterality Date  . 25 GAUGE PARS PLANA VITRECTOMY WITH 20 GAUGE MVR PORT FOR MACULAR HOLE Right 12/10/2017   Procedure: PARS PLANA VITRECTOMY WITH 25 GAUGE LAZER AND GAS MACULAR HOLE membrane pale gas;  Surgeon: Rennis ChrisZamora, Brian, MD;  [facility];  Service: Ophthalmology;  Laterality: Right;  . COLONOSCOPY    . HERNIA REPAIR    . TONSILLECTOMY  2005  . TUBAL LIGATION      There were no vitals filed for this visit.  Subjective Assessment - 02/17/19 1344    Currently in Pain?  No/denies        S: "This was my least favorite exercise"  O: Continued education and activities given on self esteem and how to improve current practices in daily life. Worksheet given for pt to identify a positive trait about themselves with each letter of the alphabet to use as reference for future times of need and to gain insight on numerous positive qualities. Additional activity given: self confidence worksheet completed identifying previous positive situations and previous negative situations and how it was handled. Breakdown of past experiences used to apply to future situations to help foster self confidence when engaging BADL/IADL and integrating into the community.  A: Pt presents with flat affect, engaged and participatory throughout group. Pt occasionally  accusatory of OT and challenging of activities. Pt completed A-Z activity, stating it was "tricky" to think of words that described her personally. Pt completed negative vs. Positive activity appropriately, then stating "this was my least favorite activity" because "you can't get depressed people together and make them think about bad things about themselves", also stating "how is this helpful". Another pt interjected, reframing purpose of activity. Pt appears to be in understanding after OT follows up.  P: OT Group will be x5 per week while pt  in PHP.                     OT Education - 02/17/19 1344    Education Details  continued education given on self esteem    Person(s) Educated  Patient    Methods  Explanation;Handout    Comprehension  Verbalized understanding       OT Short Term Goals - 02/16/19 1428      OT SHORT TERM GOAL #1   Title  Pt will be educated on strategies to improve psychosocial skills needed to participate fully in all daily, work, and leisure activities    Time  4    Period  Weeks    Status  On-going    Target Date  03/15/19      OT SHORT TERM GOAL #2   Title  Pt will apply psychosocial skills and coping mechanisms to daily activities in order to function independently and reintegrate into community    Time  4    Period  Weeks    Status  On-going    Target Date  03/15/19      OT SHORT TERM GOAL #3   Title  Pt will engage in goal setting to improve functional BADL/IADL routine upon reintegrating into community    Time  4    Period  Weeks    Status  On-going    Target Date  03/15/19      OT SHORT TERM GOAL #4   Title  Pt will recall and/or apply 1-3 sleep hygiene strategies to improve functional BADL engagement upon reintegrating into community    Time  4    Period  Weeks    Status  On-going               Plan - 02/17/19 1347    Occupational performance deficits (Please refer to evaluation for details):  ADL's;IADL's;Rest and Sleep;Work;Leisure;Social Participation    Body Structure / Function / Physical Skills  ADL    Cognitive Skills  Attention    Psychosocial Skills  Coping Strategies;Routines and Behaviors;Habits;Interpersonal Interaction;Environmental  Adaptations       Patient will benefit from skilled therapeutic intervention in order to improve the following deficits and impairments:  Cognitive Skills, Body Structure / Function / Physical Skills, Psychosocial Skills  Visit Diagnosis: MDD (major depressive disorder), recurrent, severe, with psychosis  (HCC)  GAD (generalized anxiety disorder)  PTSD (post-traumatic stress disorder)  Difficulty coping    Problem List Patient Active Problem List   Diagnosis Date Noted  . Disease of thyroid gland 03/15/2018  . Migraine 03/15/2018  . Lichen sclerosus et atrophicus 05/30/2015   Dalphine Handing, MSOT, OTR/L Behavioral Health OT/ Acute Relief OT PHP Office: [phone]  Dalphine Handing 02/17/2019, 1:48 PM  [facility] HOSPITALIZATION PROGRAM [address] [place], [place], [address] Phone: [phone]   Fax:  [phone]  Name: Sandra Herring MRN: [id number] Date of Birth: [birth year]

## 2019-02-18 ENCOUNTER — Other Ambulatory Visit (HOSPITAL_COMMUNITY): Payer: 59 | Admitting: Occupational Therapy

## 2019-02-18 ENCOUNTER — Encounter (HOSPITAL_COMMUNITY): Payer: Self-pay | Admitting: Occupational Therapy

## 2019-02-18 ENCOUNTER — Ambulatory Visit (HOSPITAL_COMMUNITY): Payer: 59

## 2019-02-18 ENCOUNTER — Other Ambulatory Visit (HOSPITAL_COMMUNITY): Payer: 59 | Admitting: Licensed Clinical Social Worker

## 2019-02-18 ENCOUNTER — Other Ambulatory Visit: Payer: Self-pay

## 2019-02-18 DIAGNOSIS — F4310 Post-traumatic stress disorder, unspecified: Secondary | ICD-10-CM

## 2019-02-18 DIAGNOSIS — F333 Major depressive disorder, recurrent, severe with psychotic symptoms: Secondary | ICD-10-CM

## 2019-02-18 DIAGNOSIS — F411 Generalized anxiety disorder: Secondary | ICD-10-CM

## 2019-02-18 DIAGNOSIS — R4589 Other symptoms and signs involving emotional state: Secondary | ICD-10-CM

## 2019-02-18 NOTE — Psych (Signed)
Virtual Visit via Video Note  I connected with Sandra Herring on 02/18/19 at  9:00 AM EDT by a video enabled telemedicine application and verified that I am speaking with the correct person using two identifiers.   I discussed the limitations of evaluation and management by telemedicine and the availability of in person appointments. The patient expressed understanding and agreed to proceed.   I discussed the assessment and treatment plan with the patient. The patient was provided an opportunity to ask questions and all were answered. The patient agreed with the plan and demonstrated an understanding of the instructions.   The patient was advised to call back or seek an in-person evaluation if the symptoms worsen or if the condition fails to improve as anticipated.  Pt was provided 240 minutes of non-face-to-face time during this encounter.   [name], LCSW      [facility] BH PHP THERAPIST PROGRESS NOTE  Sandra Herring [id number]  Session Time: 9:00 - 10:00  Participation Level: Active  Behavioral Response: CasualAlertDepressed  Type of Therapy: Group Therapy  Treatment Goals addressed: Coping  Interventions: CBT, DBT, Supportive and Reframing  Summary: Clinician led check-in regarding current stressors and situation, and review of patient completed daily inventory. Clinician utilized active listening and empathetic response and validated patient emotions. Clinician facilitated processing group on pertinent issues.   Therapist Response: Sandra Herring is a 58 y.o. female who presents with depression, anxiety, trauma, and delusional symptoms. Patient arrived within time allowed and reports that she is feeling  "tired but not bad." Patient rates her mood at a 6 on a scale of 1-10 with 10 being great. Pt reports she has found herself mentally exhausted after group, and is trying to rest more to compensate. Pt shares that yesterday she watched a documentary about  immunizations and wanted to "present the information" to her daughter. Pt reports when she approached her daughter about it, daughter refused and told pt she was "ignorant" and "always wanted to follow these ridiculous ideas" and that she never "follows through." Pt states this was "hutful, sad, and frustrating" and that while she knows it is her daughter's "journey and right" not to engage, pt struggles with daughter's "refusing to see and listen to the light that I am giving her." Pt reports she brought the issue to her sister, who engaged with her on the conversation pt wanted. Pt continues to struggle with other people not agreeing with her and that people won't "learn from her wisdom." Patient engaged in discussion.     Session Time: 10:00 -11:00  Participation Level: Active  Behavioral Response: CasualAlertDepressed  Type of Therapy: Group Therapy, psychoeducation, psychotherapy  Treatment Goals addressed: Coping  Interventions: CBT, DBT, Solution Focused, Supportive and Reframing  Summary: Cln led discussion on how to have effective communication during a conflict. Group shared their experiences with communicating when emotional. Group discussed narrating, pressing pause, and setting specific times to return to the issue as ways to navigate different communication styles and heightened emotions.    Therapist Response: Pt shares past issues with her ex-husband and communication within conflict. Pt reports at times she would "capitualte and feel awful" and other times "put on the gloves and go for it." Pt reports what helped her was sharing the consequence of his continued behavior to him. Pt states it helped her assert and set boundaries. Pt provided feedback and support to other group members.     Session Time: 11:00 - 12:00   Participation Level:  Active   Behavioral Response: CasualAlertDepressed   Type of Therapy: Group Therapy, OT   Treatment Goals addressed: Coping    Interventions: Psychosocial skills training, Supportive,    Summary:  Occupational Therapy group   Therapist Response: Patient engaged in group. See OT note.        Session Time: 12:00- 1:00  Participation Level: Active  Behavioral Response: CasualAlertDepressed  Type of Therapy: Group Therapy, Psychoeducation; Psychotherapy  Treatment Goals addressed: Coping  Interventions: CBT; Solution focused; Supportive; Reframing  Summary: 12:00 - 12:50 Clinician continued topic of cognitive distortions. Cln completed review of cognitive distortion handout and group members determined examples of how the distortions play out and effect their lives.   12:50 -1:00 Clinician led check-out. Clinician assessed for immediate needs, medication compliance and efficacy, and safety concerns   Therapist Response: Pt struggled with some of the distortions discussed today, fixating on the idea of truth and how one can know facts. Pt reports not agreeing with science and it's processes and does not know that she will be able to decipher what is and is not a "fact." At check-out, patient rates her mood at a 7 on a scale of 1-10 with 10 being great. Patient reports afternoon plans of taking a nap and going to the park with her dog. Patient demonstrates some progress as evidenced by responding better to redirection. Patient denies SI/HI/self-harm at the end of group.    Suicidal/Homicidal: Nowithout intent/plan   Plan: Pt will continue in PHP while working to decrease symptoms and increase ability to manage symptoms in a healthy manner.    Diagnosis: MDD (major depressive disorder), recurrent, severe, with psychosis (HCC) [F33.3]    1. MDD (major depressive disorder), recurrent, severe, with psychosis (HCC)   2. GAD (generalized anxiety disorder)   3. PTSD (post-traumatic stress disorder)       Donia GuilesJenny Maraki Macquarrie, LCSW 02/18/2019

## 2019-02-18 NOTE — Therapy (Signed)
[facility] PARTIAL HOSPITAL[facility]ATION PROGRAM [address] [place], [place], [address] Phone: [phone]   Fax:  [phone]  Occupational Therapy Treatment  Patient Details  Name: Sandra Herring MRN: [id number] Date of Birth: [birth year] Referring Provider (OT): Hillery Jacksanika Lewis, NP  Virtual Visit via Video Note  I connected with Sandra Herring on 02/18/19 at  8:00 AM EDT by a video enabled telemedicine application and verified that I am speaking with the correct person using two identifiers.   I discussed the limitations of evaluation and management by telemedicine and the availability of in person appointments. The patient expressed understanding and agreed to proceed.  I discussed the assessment and treatment plan with the patient. The patient was provided an opportunity to ask questions and all were answered. The patient agreed with the plan and demonstrated an understanding of the instructions.   The patient was advised to call back or seek an in-person evaluation if the symptoms worsen or if the condition fails to improve as anticipated.  I provided 60 minutes of non-face-to-face time during this encounter.  Dalphine HandingKaylee Sheralee Qazi, MSOT, OTR/L Behavioral Health OT/ Acute Relief OT PHP Office: [phone]  Dalphine HandingKaylee Augusta [place], [place]    Encounter Date: 02/18/2019  OT End of Session - 02/18/19 1338    Visit Number  4    Number of Visits  16    Date for OT Re-Evaluation  03/15/19    Authorization Type  [employer]    OT Start Time  1100    OT Stop Time  1200    OT Time Calculation (min)  60 min    Activity Tolerance  Patient tolerated treatment well    Behavior During Therapy  [facility] for tasks assessed/performed       Past Medical History:  Diagnosis Date  . Anxiety   . Depression   . GERD (gastroesophageal reflux disease)   . Headache    migraines  . PTSD (post-traumatic stress disorder)   . PTSD (post-traumatic stress disorder)   . Seizures (HCC)    febrile seizure at age 57 months  . Thyroid nodule   . Vaginal infection     Past Surgical History:  Procedure Laterality Date  . 25 GAUGE PARS PLANA VITRECTOMY WITH 20 GAUGE MVR PORT FOR MACULAR HOLE Right 12/10/2017   Procedure: PARS PLANA VITRECTOMY WITH 25 GAUGE LAZER AND GAS MACULAR HOLE membrane pale gas;  Surgeon: Rennis ChrisZamora, Brian, MD;  [facility];  Service: Ophthalmology;  Laterality: Right;  . COLONOSCOPY    . HERNIA REPAIR    . TONSILLECTOMY  2005  . TUBAL LIGATION      There were no vitals filed for this visit.  Subjective Assessment - 02/18/19 1335    Currently in Pain?  No/denies        S: "I am actually enjoying not having such a strict routine because I was so burned out from work"  O: Education given on importance of routine when balancing occupations, but in specific relation to COVID-19 pandemic. Pt asked to share current hardships they are facing with the stay at home orders, in relation to their personal occupations. Education and problem solving given for personal situations. Pt asked to share current goals for building routine during the time of the pandemic.  A: Pt present with flat affect, engaged and participatory throughout group. 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 Education given on how routine is important and that is can change for more leisure/socialization/etc while there is downtime and she is not working. Pt shares she is focusing this time on journaling with positive reflections and wishes to continue doing this task.  P: OT group will be x5 per week while pt in PHP.                   OT Education - 02/18/19 1336    Education Details  education given on routine during quarantine    Person(s) Educated  Patient    Methods  Explanation;Handout    Comprehension  Verbalized understanding       OT Short  Term Goals - 02/16/19 1428      OT SHORT TERM GOAL #1   Title  Pt will be educated on strategies to improve psychosocial skills needed to participate fully in all daily, work, and leisure activities    Time  4    Period  Weeks    Status  On-going    Target Date  03/15/19      OT SHORT TERM GOAL #2   Title  Pt will apply psychosocial skills and coping mechanisms to daily activities in order to function independently and reintegrate into community    Time  4    Period  Weeks    Status  On-going    Target Date  03/15/19      OT SHORT TERM GOAL #3   Title  Pt will engage in goal setting to improve functional BADL/IADL routine upon reintegrating into community    Time  4    Period  Weeks    Status  On-going    Target Date  03/15/19      OT SHORT TERM GOAL #4   Title  Pt will recall and/or apply 1-3 sleep hygiene strategies to improve functional BADL engagement upon reintegrating into community    Time  4    Period  Weeks    Status  On-going               Plan - 02/18/19 1338    Occupational performance deficits (Please refer to evaluation for details):  ADL's;IADL's;Rest and Sleep;Work;Leisure;Social Participation    Body Structure / Function / Physical Skills  ADL    Cognitive Skills  Attention    Psychosocial Skills  Coping Strategies;Routines and Behaviors;Habits;Interpersonal Interaction;Environmental  Adaptations       Patient will benefit from skilled therapeutic intervention in order to improve the following deficits and impairments:  Cognitive Skills, Body Structure / Function / Physical Skills, Psychosocial Skills  Visit Diagnosis: MDD (major depressive disorder), recurrent, severe, with psychosis (HCC)  GAD (generalized anxiety disorder)  PTSD (post-traumatic stress disorder)  Difficulty coping    Problem List Patient Active Problem List   Diagnosis Date Noted  . Disease of thyroid gland 03/15/2018  . Migraine 03/15/2018  . Lichen sclerosus et  atrophicus 05/30/2015   Dalphine Handing, MSOT, OTR/L Behavioral Health OT/ Acute Relief OT PHP Office: [phone]  Dalphine Handing 02/18/2019, 1:39 PM  [facility] PARTIAL HOSPITALIZATION PROGRAM [address] [place], [place], [address] Phone: [phone]   Fax:  [phone]  Name: Sandra Herring MRN: [id number] Date of Birth: 1961-04-10

## 2019-02-21 ENCOUNTER — Ambulatory Visit (HOSPITAL_COMMUNITY): Payer: 59

## 2019-02-21 ENCOUNTER — Other Ambulatory Visit: Payer: Self-pay

## 2019-02-21 ENCOUNTER — Other Ambulatory Visit (HOSPITAL_COMMUNITY): Payer: 59 | Admitting: Occupational Therapy

## 2019-02-21 ENCOUNTER — Encounter (HOSPITAL_COMMUNITY): Payer: Self-pay | Admitting: Occupational Therapy

## 2019-02-21 ENCOUNTER — Other Ambulatory Visit (HOSPITAL_COMMUNITY): Payer: 59 | Admitting: Family

## 2019-02-21 DIAGNOSIS — F333 Major depressive disorder, recurrent, severe with psychotic symptoms: Secondary | ICD-10-CM

## 2019-02-21 DIAGNOSIS — F411 Generalized anxiety disorder: Secondary | ICD-10-CM

## 2019-02-21 DIAGNOSIS — R4589 Other symptoms and signs involving emotional state: Secondary | ICD-10-CM

## 2019-02-21 DIAGNOSIS — F4310 Post-traumatic stress disorder, unspecified: Secondary | ICD-10-CM

## 2019-02-21 MED ORDER — HYDROXYZINE PAMOATE 25 MG PO CAPS: 25.0000 mg | ORAL_CAPSULE | Freq: Three times a day (TID) | ORAL | 0 refills | Status: DC | PRN

## 2019-02-21 NOTE — Therapy (Signed)
[facility] PARTIAL HOSPITALIZATION PROGRAM [address] [place], [place], [address] Phone: [phone]   Fax:  [phone]  Occupational Therapy Treatment  Patient Details  Name: Sandra Herring MRN: [id number] Date of Birth: 06/16/1961 Referring Provider (OT): Hillery Jacks, NP  Virtual Visit via Video Note  I connected with Sandra Cockayne on 02/21/19 at  8:00 AM EDT by a video enabled telemedicine application and verified that I am speaking with the correct person using two identifiers.   I discussed the limitations of evaluation and management by telemedicine and the availability of in person appointments. The patient expressed understanding and agreed to proceed.   I discussed the assessment and treatment plan with the patient. The patient was provided an opportunity to ask questions and all were answered. The patient agreed with the plan and demonstrated an understanding of the instructions.   The patient was advised to call back or seek an in-person evaluation if the symptoms worsen or if the condition fails to improve as anticipated.  I provided 60 minutes of non-face-to-face time during this encounter.  Dalphine Handing, MSOT, OTR/L Behavioral Health OT/ Acute Relief OT PHP Office: [phone]  Dalphine Handing, [place]    Encounter Date: 02/21/2019  OT End of Session - 02/21/19 1341    Visit Number  5    Number of Visits  16    Date for OT Re-Evaluation  03/15/19    Authorization Type  UHC    OT Start Time  1100    OT Stop Time  1200    OT Time Calculation (min)  60 min    Activity Tolerance  Patient tolerated treatment well    Behavior During Therapy  WFL for tasks assessed/performed       Past Medical History:  Diagnosis Date  . Anxiety   . Depression   . GERD (gastroesophageal reflux disease)   . Headache    migraines  . PTSD (post-traumatic stress disorder)   . PTSD (post-traumatic stress disorder)   . Seizures (HCC)     febrile seizure at age 39 months  . Thyroid nodule   . Vaginal infection     Past Surgical History:  Procedure Laterality Date  . 25 GAUGE PARS PLANA VITRECTOMY WITH 20 GAUGE MVR PORT FOR MACULAR HOLE Right 12/10/2017   Procedure: PARS PLANA VITRECTOMY WITH 25 GAUGE LAZER AND GAS MACULAR HOLE membrane pale gas;  Surgeon: Rennis Chris, MD;  Location: [facility] OR;  Service: Ophthalmology;  Laterality: Right;  . COLONOSCOPY    . HERNIA REPAIR    . TONSILLECTOMY  2005  . TUBAL LIGATION      There were no vitals filed for this visit.  Subjective Assessment - 02/21/19 1337    Currently in Pain?  No/denies        S: "I have suppressed and been passive with my stress"   O: Stress management group completed to use as productive coping strategy, to help mitigate maladaptive coping to integrate in functional BADL/IADL. Education given on the definition of stress and its cognitive, behavioral, emotional, and physical effects on the body. Stress management tool worksheet discussed to educate on unhealthy vs healthy coping skills to manage stress to improve community integration. Further stress management to be given next session.  A: Pt presents with flat affect, engaged and participatory throughout session. Pt in understanding of education given. She shares that suppression and passivity are her two main unhealthy coping mechanisms. She shares that her big stressor  is work and how she suppressed her stress, which ultimately lead her here.   P: OT Group will be x4 per week while pt in PHP                    OT Education - 02/21/19 1340    Education Details  education given on stress management    Person(s) Educated  Patient    Methods  Explanation;Handout    Comprehension  Verbalized understanding       OT Short Term Goals - 02/16/19 1428      OT SHORT TERM GOAL #1   Title  Pt will be educated on strategies to improve psychosocial skills needed to participate fully in all  daily, work, and leisure activities    Time  4    Period  Weeks    Status  On-going    Target Date  03/15/19      OT SHORT TERM GOAL #2   Title  Pt will apply psychosocial skills and coping mechanisms to daily activities in order to function independently and reintegrate into community    Time  4    Period  Weeks    Status  On-going    Target Date  03/15/19      OT SHORT TERM GOAL #3   Title  Pt will engage in goal setting to improve functional BADL/IADL routine upon reintegrating into community    Time  4    Period  Weeks    Status  On-going    Target Date  03/15/19      OT SHORT TERM GOAL #4   Title  Pt will recall and/or apply 1-3 sleep hygiene strategies to improve functional BADL engagement upon reintegrating into community    Time  4    Period  Weeks    Status  On-going               Plan - 02/21/19 1417    OT Frequency  4x / week    OT Duration  4 weeks       Patient will benefit from skilled therapeutic intervention in order to improve the following deficits and impairments:  Cognitive Skills, Body Structure / Function / Physical Skills, Psychosocial Skills  Visit Diagnosis: MDD (major depressive disorder), recurrent, severe, with psychosis (HCC)  PTSD (post-traumatic stress disorder)  GAD (generalized anxiety disorder)  Difficulty coping    Problem List Patient Active Problem List   Diagnosis Date Noted  . Disease of thyroid gland 03/15/2018  . Migraine 03/15/2018  . Lichen sclerosus et atrophicus 05/30/2015   Dalphine HandingKaylee Toren Tucholski, MSOT, OTR/L Behavioral Health OT/ Acute Relief OT PHP Office: [phone]  Dalphine HandingKaylee Farris Geiman 02/21/2019, 2:17 PM  [facility] PARTIAL HOSPITALIZATION PROGRAM [address] [place], [place], [address] Phone: [phone]   Fax:  [phone]  Name: Sandra Herring MRN: [id number] Date of Birth: [birth year]

## 2019-02-21 NOTE — Progress Notes (Signed)
Met with patient through virtual video visit to assess her status with reported development of a rash she first noticed this past Thursday night.  Patient reported she stopped her Lamictal on Friday 02/18/19 and has been using Benadryl to help with itching and discomfort since stopping medication.  Patient reported diffuse red rash on the back of her neck, her back torso, arms, and hands primarily.  Patient reported some areas were beginning to burn and blister.  Ricky Ala, NP was notified and she also discussed reaction with patient and attempted to view areas through virtual video.  Patient instructed to not restart Lamictal as was discontinued and NP will send in Hydroxyzine 72m three times a day as needed to patient's pharmacy.  Patient stated she had been taking Benadryl, up to 150 mg in a day, but was too sedated with taking it.  Patient denied any shortness or breath or any other symptoms at this time and agreed if this worsened or any life threatening symptoms she would go to her local emergency department and call [place].  Patient informed that as the medication begins to work its way out of her system she should begin to feel better and rash should start to go away.  Patient denied any other environmental changes or other possible reasons for current skin reaction.  Patient psychiatrically stable at this time and NP will discuss other medication options for patients once she is physically clear of current rash and reports doing better.  Patient denied any emergent need at this time and agreed will pick up Hydroxyzine medication to begin takeing this afternoon to see if helps and will call this nurse back if any further questions or problems with current symptoms or change in medications.  Lamictal was discontinued and Hydroxzyine medication sent to patient's [facility].
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
## 2019-02-22 ENCOUNTER — Ambulatory Visit (HOSPITAL_COMMUNITY): Payer: 59

## 2019-02-22 ENCOUNTER — Other Ambulatory Visit: Payer: Self-pay

## 2019-02-22 ENCOUNTER — Other Ambulatory Visit (HOSPITAL_COMMUNITY): Payer: 59 | Admitting: Occupational Therapy

## 2019-02-22 ENCOUNTER — Encounter (HOSPITAL_COMMUNITY): Payer: Self-pay

## 2019-02-22 ENCOUNTER — Other Ambulatory Visit (HOSPITAL_COMMUNITY): Payer: 59 | Admitting: Licensed Clinical Social Worker

## 2019-02-22 DIAGNOSIS — F4310 Post-traumatic stress disorder, unspecified: Secondary | ICD-10-CM

## 2019-02-22 DIAGNOSIS — F411 Generalized anxiety disorder: Secondary | ICD-10-CM

## 2019-02-22 DIAGNOSIS — F333 Major depressive disorder, recurrent, severe with psychotic symptoms: Secondary | ICD-10-CM | POA: Diagnosis not present

## 2019-02-22 DIAGNOSIS — R4589 Other symptoms and signs involving emotional state: Secondary | ICD-10-CM

## 2019-02-22 NOTE — Psych (Signed)
Virtual Visit via Video Note  I connected with Sandra Herring on 02/21/19 at  9:00 AM EDT by a video enabled telemedicine application and verified that I am speaking with the correct person using two identifiers.   I discussed the limitations of evaluation and management by telemedicine and the availability of in person appointments. The patient expressed understanding and agreed to proceed.   I discussed the assessment and treatment plan with the patient. The patient was provided an opportunity to ask questions and all were answered. The patient agreed with the plan and demonstrated an understanding of the instructions.   The patient was advised to call back or seek an in-person evaluation if the symptoms worsen or if the condition fails to improve as anticipated.  Pt was provided 240 minutes of non-face-to-face time during this encounter.   Sandra GuilesJenny Analiese Krupka, LCSW      [facility] PHP THERAPIST PROGRESS NOTE  Sandra Herring [id number]  Session Time: 9:00 - 10:00  Participation Level: Active  Behavioral Response: CasualAlertDepressed  Type of Therapy: Group Therapy  Treatment Goals addressed: Coping  Interventions: CBT, DBT, Supportive and Reframing  Summary: Clinician led check-in regarding current stressors and situation, and review of patient completed daily inventory. Clinician utilized active listening and empathetic response and validated patient emotions. Clinician facilitated processing group on pertinent issues.   Therapist Response: Sandra Herring is a 58 y.o. female who presents with depression, anxiety, and  Trauma symptoms. Patient arrived within time allowed and reports that she is feeling "in a funk." Patient rates her mood at a 4 on a scale of 1-10 with 10 being great. Pt reports she has been feeling "sad" and off most of the weekend. Pt shares she thinks she had a side effect due to her increase in Lamictal and that she has been itchy and has developed some  hives. Provider will check in with pt on this matter. Pt reports the itchiness has made her irritable. Pt shares she "forced" herself to paint in an effort to improve mood and was "desolate" when she painted but had no joy, as this is the first time it has ever happened. Pt reports she talked to her mother on the phone on Sunday and asked her what she thought pt should do as a new job. Pt states mom told her "I always depended on you to tell me what to do. You always had the answers and guidance." Pt shares this was a validating moment because she hadn't known that her mom had any awareness of her reliance on her. Pt reports she is wondering how this expectation to be the responsible one in her family has affected her relationship dynamics in life. Pt able to process. Patient engaged in discussion.     Session Time: 10:00 -11:00  Participation Level: Active  Behavioral Response: CasualAlertDepressed  Type of Therapy: Group Therapy, psychoeducation, psychotherapy  Treatment Goals addressed: Coping  Interventions: CBT, DBT, Solution Focused, Supportive and Reframing  Summary: Cln led discussion on healthy decision making and how grace, substitution, and black and white thinking have a role in making healthier decisions. Cln discussed thinking of decisions on a spectrum and trying to move closer to healthier decisions when able.   Therapist Response: Pt shares that she tries to focus on making healthier decisions instead of only achieving a certain end goal. Pt states that substitution is difficult for her with some things and she needs to make a hard line, however with other things, finding  alternatives has helped her modify unhealthy behaviors. Pt provides and accepts  Feedback from group.    Session Time: 11:00 - 12:00   Participation Level: Active   Behavioral Response: CasualAlertDepressed   Type of Therapy: Group Therapy, OT   Treatment Goals addressed: Coping   Interventions:  Psychosocial skills training, Supportive,    Summary:  Occupational Therapy group   Therapist Response: Patient engaged in group. See OT note.        Session Time: 12:00- 1:00  Participation Level: Active  Behavioral Response: CasualAlertDepressed  Type of Therapy: Group Therapy, Psychoeducation; Psychotherapy  Treatment Goals addressed: Coping  Interventions: CBT; Solution focused; Supportive; Reframing  Summary: 12:00 - 12:50 Clinician continued topic of cognitive distortions. Cln introduced "catch, challenge, change" model of behavior modification and discussed how increasing comfort in "not knowing" can assist in challenging unhealthy thought patterns. 12:50 -1:00 Clinician led check-out. Clinician assessed for immediate needs, medication compliance and efficacy, and safety concerns   Therapist Response: Pt continues to struggle with black and white thinking and challenges that hit up against this black and white dyad. Pt shares discomfort with ambiguity and will draw back into "knowing" she is "right" about other people's motivations, feelings, and thoughts. Pt is not resistant, however demonstrates poor insight into herself as seen by contradicting statements from one period to another as well as contradicting statements and actions.  At check-out, patient rates her mood at a 4 on a scale of 1-10 with 10 being great. Patient reports feeling "mind fucked" after group topics and recognizes need for rest and processing this afternoon. Pt reports plans of taking a nap, eating, and journaling. Patient demonstrates some progress as evidenced by increased openness to challenges to her ways of thinking. Patient denies SI/HI/self-harm at the end of group.    Suicidal/Homicidal: Nowithout intent/plan   Plan: Pt will continue in PHP while working to decrease symptoms and increase ability to manage symptoms in a healthy manner.    Diagnosis: MDD (major depressive disorder), recurrent,  severe, with psychosis (HCC) [F33.3]    1. MDD (major depressive disorder), recurrent, severe, with psychosis (HCC)   2. PTSD (post-traumatic stress disorder)       Sandra Guiles, LCSW 02/22/2019

## 2019-02-22 NOTE — Therapy (Addendum)
[facility] [address] [place], [place], [address] Phone: [phone]   Fax:  [phone]  Occupational Therapy Treatment  Patient Details  Name: Sandra Herring MRN: [id number] Date of Birth: 1961-01-12 Referring Provider (OT): Hillery Jacksanika Lewis, NP  Virtual Visit via Video Note  I connected with Sandra Herring on 02/22/19 at  8:00 AM EDT by a video enabled telemedicine application and verified that I am speaking with the correct person using two identifiers.   I discussed the limitations of evaluation and management by telemedicine and the availability of in person appointments. The patient expressed understanding and agreed to proceed.   I discussed the assessment and treatment plan with the patient. The patient was provided an opportunity to ask questions and all were answered. The patient agreed with the plan and demonstrated an understanding of the instructions.   The patient was advised to call back or seek an in-person evaluation if the symptoms worsen or if the condition fails to improve as anticipated.  I provided 60 minutes of non-face-to-face time during this encounter.  Dalphine HandingKaylee Jenell Dobransky, MSOT, OTR/L Behavioral Health OT/ Acute Relief OT PHP Office: [phone]  Dalphine HandingKaylee Elaya Droege, [place]    Encounter Date: 02/22/2019  OT End of Session - 02/22/19 1320    Visit Number  6    Number of Visits  16    Date for OT Re-Evaluation  03/15/19    Authorization Type  [employer]    OT Start Time  1100    OT Stop Time  1200    OT Time Calculation (min)  60 min    Activity Tolerance  Patient tolerated treatment well    Behavior During Therapy  WFL for tasks assessed/performed       Past Medical History:  Diagnosis Date  . Anxiety   . Depression   . GERD (gastroesophageal reflux disease)   . Headache    migraines  . PTSD (post-traumatic stress disorder)   . PTSD (post-traumatic stress disorder)   . Seizures (HCC)     febrile seizure at age 57 months  . Thyroid nodule   . Vaginal infection     Past Surgical History:  Procedure Laterality Date  . 25 GAUGE PARS PLANA VITRECTOMY WITH 20 GAUGE MVR PORT FOR MACULAR HOLE Right 12/10/2017   Procedure: PARS PLANA VITRECTOMY WITH 25 GAUGE LAZER AND GAS MACULAR HOLE membrane pale gas;  Surgeon: Rennis ChrisZamora, Brian, MD;  [facility];  Service: Ophthalmology;  Laterality: Right;  . COLONOSCOPY    . HERNIA REPAIR    . TONSILLECTOMY  2005  . TUBAL LIGATION      There were no vitals filed for this visit.  Subjective Assessment - 02/22/19 1319    Currently in Pain?  No/denies         S: "I find that spinning helps me reduce my stress"  O: Continued education given on stress management from previous date. Coping strategies taught include: relaxation based- deep breathing, counting to 10, taking a 1 minute vacation, acceptance, stress balls, relaxation audio/video, visual/mental imagery. Positive mental attitude- gratitude, acceptance, cognitive reframing, positive self talk, anger management.   A: Pt presents with labile affect this date, tearful and laughing at unpredictable points throughout session. Pt shares she "spins around in circles" in the yard and uses a lot of mental imagery to manage stress. She also shares the mantras she consistently uses. After learning new skills this date, she would like to implement counting and  a puzzle book for stress management.   P: OT Group will be x4 per week while pt in PHP.                   OT Education - 02/22/19 1319    Education Details  cont education given on stress management    Person(s) Educated  Patient    Methods  Explanation;Handout    Comprehension  Verbalized understanding       OT Short Term Goals - 02/16/19 1428      OT SHORT TERM GOAL #1   Title  Pt will be educated on strategies to improve psychosocial skills needed to participate fully in all daily, work, and leisure activities     Time  4    Period  Weeks    Status  On-going    Target Date  03/15/19      OT SHORT TERM GOAL #2   Title  Pt will apply psychosocial skills and coping mechanisms to daily activities in order to function independently and reintegrate into community    Time  4    Period  Weeks    Status  On-going    Target Date  03/15/19      OT SHORT TERM GOAL #3   Title  Pt will engage in goal setting to improve functional BADL/IADL routine upon reintegrating into community    Time  4    Period  Weeks    Status  On-going    Target Date  03/15/19      OT SHORT TERM GOAL #4   Title  Pt will recall and/or apply 1-3 sleep hygiene strategies to improve functional BADL engagement upon reintegrating into community    Time  4    Period  Weeks    Status  On-going               Plan - 02/22/19 1321    Occupational performance deficits (Please refer to evaluation for details):  ADL's;IADL's;Rest and Sleep;Work;Leisure;Social Participation    Body Structure / Function / Physical Skills  ADL    Cognitive Skills  Attention    Psychosocial Skills  Coping Strategies;Routines and Behaviors;Habits;Interpersonal Interaction;Environmental  Adaptations       Patient will benefit from skilled therapeutic intervention in order to improve the following deficits and impairments:  Cognitive Skills, Body Structure / Function / Physical Skills, Psychosocial Skills  Visit Diagnosis: MDD (major depressive disorder), recurrent, severe, with psychosis (HCC)  PTSD (post-traumatic stress disorder)  GAD (generalized anxiety disorder)  Difficulty coping    Problem List Patient Active Problem List   Diagnosis Date Noted  . Disease of thyroid gland 03/15/2018  . Migraine 03/15/2018  . Lichen sclerosus et atrophicus 05/30/2015   Dalphine Handing, MSOT, OTR/L Behavioral Health OT/ Acute Relief OT PHP Office: [phone]  Dalphine Handing 02/22/2019, 1:25 PM  [facility]  HOSPITALIZATION PROGRAM [address] [place], [place], [address] Phone: [phone]   Fax:  [phone]  Name: Sandra Herring MRN: [id number] Date of Birth: 08/22/1961

## 2019-02-22 NOTE — Progress Notes (Signed)
Met with patient through virtual video conference to assess her status with management of reported rash from 02/21/19 and use of Hydroxyzine medication to help with itching and burning sensation from rash.  Patient reported she was feeling very "loopy" this morning as she had taken her Valium 65m, Propranolol 241m and 2 Hydroxzyine 50 mg capsules before going to bed the night before.  Patient reported sleeping all night but now feeling very "hungover".  States her rash has improved and only raised area still noticeable on the back of her neck in her hairline.  Patient reported itching and burning sensations have lessened and thinks this will continue to improve now that the Lamictal was discontinued and appears to be getting out of her.  Patient more emotional today, crying more with labile mood.  States her Mother validated to her the previous night that she has always, from a very young age, counted on patient to make decisions for her and to care for others.  Patient reported PHP has been very insightful but that this also scares her as she learns more about herself.  Questions who she is and has she always just been conditioned to serve her role as a caretaker.  Reports she desires to be authentic and to figure out who she really is or is meant to be.  Patient does deny any suicidal or homicidal ideations, no plan or intent to want to harm self or others.  Patient rated her depression a 4-5 and anxiety a 4-5 on a scale of 0-10 with 0 being none and 10 the worst she could manage. Encouraged patient to continue to share feeling and thoughts in PHP groups and to also use learned skills to help with processing and management of these thoughts.  Patient agreed with plan and stated no other concerns today.  Patient agreed to contact his nurse or PHP staff if any worsening or symptoms, if rash did not continue to improve or if she began to have any thoughts of wanting to harm self or others.  [facility] will check on patient this week to see if any other medication adjustments need to be initiated.

## 2019-02-23 ENCOUNTER — Ambulatory Visit (HOSPITAL_COMMUNITY): Payer: 59

## 2019-02-23 ENCOUNTER — Other Ambulatory Visit: Payer: Self-pay

## 2019-02-23 ENCOUNTER — Other Ambulatory Visit (HOSPITAL_COMMUNITY): Payer: 59 | Admitting: Licensed Clinical Social Worker

## 2019-02-23 ENCOUNTER — Encounter (HOSPITAL_COMMUNITY): Payer: Self-pay | Admitting: Family

## 2019-02-23 ENCOUNTER — Other Ambulatory Visit (HOSPITAL_COMMUNITY): Payer: 59

## 2019-02-23 DIAGNOSIS — F411 Generalized anxiety disorder: Secondary | ICD-10-CM

## 2019-02-23 DIAGNOSIS — F333 Major depressive disorder, recurrent, severe with psychotic symptoms: Secondary | ICD-10-CM | POA: Diagnosis not present

## 2019-02-23 DIAGNOSIS — F4310 Post-traumatic stress disorder, unspecified: Secondary | ICD-10-CM

## 2019-02-23 NOTE — Psych (Signed)
Virtual Visit via Video Note  I connected with Sandra Herring on 02/23/19 at  9:00 AM EDT by a video enabled telemedicine application and verified that I am speaking with the correct person using two identifiers.   I discussed the limitations of evaluation and management by telemedicine and the availability of in person appointments. The patient expressed understanding and agreed to proceed.   I discussed the assessment and treatment plan with the patient. The patient was provided an opportunity to ask questions and all were answered. The patient agreed with the plan and demonstrated an understanding of the instructions.   The patient was advised to call back or seek an in-person evaluation if the symptoms worsen or if the condition fails to improve as anticipated.  Pt was provided 240 minutes of non-face-to-face time during this encounter.   Sandra Guiles, LCSW      [facility] BH PHP THERAPIST PROGRESS NOTE  Sandra Herring [id number]  Session Time: 9:00 - 10:00  Participation Level: Active  Behavioral Response: CasualAlertDepressed  Type of Therapy: Group Therapy  Treatment Goals addressed: Coping  Interventions: CBT, DBT, Supportive and Reframing  Summary: Clinician led check-in regarding current stressors and situation, and review of patient completed daily inventory. Clinician utilized active listening and empathetic response and validated patient emotions. Clinician facilitated processing group on pertinent issues.   Therapist Response: Sandra Herring is a 59 y.o. female who presents with depression, anxiety, and  trauma symptoms. Patient arrived within time allowed and reports that she is feeling "a little better." Patient rates her mood at a 6 on a scale of 1-10 with 10 being great. Pt reports she continues to feel a little "loopy" however only took 1 Hydroxizine last night and her rash continues to improve slowly. Pt shares that she "dreamt of color" last night  which has left her "hopeful" that the "art muse" has returned. Pt reports resting for about 3 hours yesterday, ordering food as a way to comfort herself, and talking to her mom and sister before going to bed early.  Patient engaged in discussion.     Session Time: 10:00 -11:00  Participation Level: Active  Behavioral Response: CasualAlertDepressed  Type of Therapy: Group Therapy, psychoeducation, psychotherapy  Treatment Goals addressed: Coping  Interventions: CBT, DBT, Solution Focused, Supportive and Reframing  Summary: Cln led discussion on difficult boundaries and how to keep the focus on a defined sense of self rather than the demands of others. Group members shared ways they have struggled with boundaries in different relationships and problem solved ways to reframe and re-center.   Therapist Response: Pt reports struggle with her mom, who has had a "sexual awakening" and wants to talk about sex "constantly." Pt reports this is difficult for her because she recognizes it is inappropriate and that she is again taking a "mothering" role. It is difficult for pt to not take responsibility for her mom and sister during discussion, however responds well to reframing from cln and group. Pt able to process boundary options.Pt provides and accepts feedback from group.     Session Time: 11:00 -12:00  Participation Level:Active  Behavioral Response:CasualAlertDepressed  Type of Therapy: Group Therapy, psychotherapy  Treatment Goals addressed: Coping  Interventions:Strengths based, reframing, Supportive,   Summary:Spiritual Care group  Therapist Response: Patient engaged in group. See chaplain note.       Session Time: 12:00- 1:00  Participation Level:Active  Behavioral Response:CasualAlertDepressed  Type of Therapy: Group Therapy, Psychoeducation  Treatment Goals addressed: Coping  Interventions:relaxation  training; Supportive;  Reframing  Summary:12:00 - 12:50: Relaxation group: Cln led group focused on retraining the body's response to stress.  12:50 -1:00 Clinician led check-out. Clinician assessed for immediate needs, medication compliance and efficacy, and safety concerns  Therapist Response: Patient engaged in activity and discussion. At check-out, patient rates hermood at a 7on a scale of 1-10 with 10 being great. Patient reports afternoon plansoflooking for pictures for her son and mixing paints.Patient demonstrates some progress as evidenced by increased integration of topics into her life. Patient denies SI/HI/self-harm thoughts at the end of group.   Suicidal/Homicidal: Nowithout intent/plan   Plan: Pt will continue in PHP while working to decrease symptoms and increase ability to manage symptoms in a healthy manner.    Diagnosis: MDD (major depressive disorder), recurrent, severe, with psychosis (HCC) [F33.3]    1. MDD (major depressive disorder), recurrent, severe, with psychosis (HCC)   2. PTSD (post-traumatic stress disorder)   3. GAD (generalized anxiety disorder)       Sandra GuilesJenny Harleigh Civello, LCSW 02/23/2019

## 2019-02-23 NOTE — Psych (Signed)
Virtual Visit via Video Note  I connected with Sandra Herring on 02/22/19 at  9:00 AM EDT by a video enabled telemedicine application and verified that I am speaking with the correct person using two identifiers.   I discussed the limitations of evaluation and management by telemedicine and the availability of in person appointments. The patient expressed understanding and agreed to proceed.   I discussed the assessment and treatment plan with the patient. The patient was provided an opportunity to ask questions and all were answered. The patient agreed with the plan and demonstrated an understanding of the instructions.   The patient was advised to call back or seek an in-person evaluation if the symptoms worsen or if the condition fails to improve as anticipated.  Pt was provided 240 minutes of non-face-to-face time during this encounter.   Sandra Guiles, LCSW      [facility] BH PHP THERAPIST PROGRESS NOTE  Sandra Herring [id number]  Session Time: 9:00 - 10:00  Participation Level: Active  Behavioral Response: CasualAlertDepressed  Type of Therapy: Group Therapy  Treatment Goals addressed: Coping  Interventions: CBT, DBT, Supportive and Reframing  Summary: Clinician led check-in regarding current stressors and situation, and review of patient completed daily inventory. Clinician utilized active listening and empathetic response and validated patient emotions. Clinician facilitated processing group on pertinent issues.   Therapist Response: Sandra Herring is a 58 y.o. female who presents with depression, anxiety, and  trauma symptoms. Patient arrived within time allowed and reports that she is feeling "loopy." Patient rates her mood at a 5 on a scale of 1-10 with 10 being great. Pt reports she took to Hydroxizine last night to address her rash symptoms and it knocked her out. Pt reports she slept for 11 hours and is still feeling groggy. Pt states she has noticed some  slight improvement in appearance and itchiness from the rash. Pt reports she talked to her therapist yesterday and went to bed early. Pt is labile throughout session with increase in scattered thought and behaviors. Patient engaged in discussion.     Session Time: 10:00 -11:00  Participation Level: Active  Behavioral Response: CasualAlertDepressed  Type of Therapy: Group Therapy, psychoeducation, psychotherapy  Treatment Goals addressed: Coping  Interventions: CBT, DBT, Solution Focused, Supportive and Reframing  Summary: Cln led discussion on concept of "finding the real me." Group shared ways in which this felt true to them and an endeavor that they wanted to engage in or have been engaging in. Cln offered alternate view of "rediscovery" or casting off the negative, distorted, unhelpful messages or patterns that we have been engaging with as way to differentiate that what is happening to [place] might not be something we want to carry with [place], however it is not a deviation or something separate from who we are or a separate part of our story.   Therapist Response: Pt shares realization that the responsibility her mother placed on her through childhood has affected her ability to be a "whole person." Pt reports "I feel more lost today than I have ever felt." Pt reports that growing up in a traumatic childhood took who she was and she needs to find herself. Pt acknowledges this is very overwhelming. Pt responds to reframing her new awareness and connects with idea of "uncovering" or "excavating" herself from what she has had to think/do to survive. Pt states "I need to trust the process and know that I am doing what I need to do and a who  I need to be right now." Pt continues to present with labile mood at times crying, speaking loudly and forcefully, and laughing. Pt provides and accepts feedback from group.    Session Time: 11:00 - 12:00   Participation Level: Active   Behavioral Response:  CasualAlertDepressed   Type of Therapy: Group Therapy, OT   Treatment Goals addressed: Coping   Interventions: Psychosocial skills training, Supportive,    Summary:  Occupational Therapy group   Therapist Response: Patient engaged in group. See OT note.        Session Time: 12:00- 1:00  Participation Level: Active  Behavioral Response: CasualAlertDepressed  Type of Therapy: Group Therapy, Psychoeducation; Psychotherapy  Treatment Goals addressed: Coping  Interventions: CBT; Solution focused; Supportive; Reframing  Summary: 12:00 - 12:50 Clinician continued topic of cognitive distortions. Cln led review and provided education on handout "Socratic Questions." Group discussed ways to utilize socratic questions to challenge unhealthy thought patterns and practiced with examples.  12:50 -1:00 Clinician led check-out. Clinician assessed for immediate needs, medication compliance and efficacy, and safety concerns   Therapist Response: Pt reports understanding of how to utilize socratic questions when challenging unhealthy thoughts. Pt still struggles with concrete thinking and being able to accept more abstract thought, however was able to move through it with more success today. At check-out, patient rates her mood at a 5 on a scale of 1-10 with 10 being great. Patient reports afternoon plans of eating, smoking, and resting.  Patient demonstrates some progress as evidenced by ability to accept reframing. Patient denies SI/HI/self-harm at the end of group.    Suicidal/Homicidal: Nowithout intent/plan   Plan: Pt will continue in PHP while working to decrease symptoms and increase ability to manage symptoms in a healthy manner.    Diagnosis: MDD (major depressive disorder), recurrent, severe, with psychosis (HCC) [F33.3]    1. MDD (major depressive disorder), recurrent, severe, with psychosis (HCC)   2. PTSD (post-traumatic stress disorder)   3. GAD (generalized anxiety  disorder)       Sandra GuilesJenny Arius Harnois, LCSW 02/23/2019

## 2019-02-24 ENCOUNTER — Other Ambulatory Visit (HOSPITAL_COMMUNITY): Payer: 59 | Admitting: Occupational Therapy

## 2019-02-24 ENCOUNTER — Ambulatory Visit (HOSPITAL_COMMUNITY): Payer: 59

## 2019-02-24 ENCOUNTER — Other Ambulatory Visit (HOSPITAL_COMMUNITY): Payer: 59 | Admitting: Licensed Clinical Social Worker

## 2019-02-24 ENCOUNTER — Encounter (HOSPITAL_COMMUNITY): Payer: Self-pay

## 2019-02-24 ENCOUNTER — Other Ambulatory Visit: Payer: Self-pay

## 2019-02-24 DIAGNOSIS — R4589 Other symptoms and signs involving emotional state: Secondary | ICD-10-CM

## 2019-02-24 DIAGNOSIS — F411 Generalized anxiety disorder: Secondary | ICD-10-CM

## 2019-02-24 DIAGNOSIS — F4310 Post-traumatic stress disorder, unspecified: Secondary | ICD-10-CM

## 2019-02-24 DIAGNOSIS — F333 Major depressive disorder, recurrent, severe with psychotic symptoms: Secondary | ICD-10-CM | POA: Diagnosis not present

## 2019-02-24 NOTE — Therapy (Signed)
[facility] PARTIAL HOSPITALIZATION PROGRAM [address] [place], [place], [address] Phone: [phone]   Fax:  [phone]  Occupational Therapy Treatment  Patient Details  Name: Sandra Herring MRN: [id number] Date of Birth: Feb 14, 1961 Referring Provider (OT): Hillery Jacks, NP  Virtual Visit via Video Note  I connected with Sandra Herring on 02/24/19 at  8:00 AM EDT by a video enabled telemedicine application and verified that I am speaking with the correct person using two identifiers.   I discussed the limitations of evaluation and management by telemedicine and the availability of in person appointments. The patient expressed understanding and agreed to proceed.  I discussed the assessment and treatment plan with the patient. The patient was provided an opportunity to ask questions and all were answered. The patient agreed with the plan and demonstrated an understanding of the instructions.   The patient was advised to call back or seek an in-person evaluation if the symptoms worsen or if the condition fails to improve as anticipated.  I provided 60 minutes of non-face-to-face time during this encounter.  Dalphine Handing, MSOT, OTR/L Behavioral Health OT/ Acute Relief OT PHP Office: [phone]  Dalphine Handing, [place]    Encounter Date: 02/24/2019  OT End of Session - 02/24/19 1620    Visit Number  7    Number of Visits  16    Date for OT Re-Evaluation  03/15/19    Authorization Type  [employer]    OT Start Time  1100    OT Stop Time  1200    OT Time Calculation (min)  60 min    Activity Tolerance  Patient tolerated treatment well    Behavior During Therapy  [facility] for tasks assessed/performed       Past Medical History:  Diagnosis Date  . Anxiety   . Depression   . GERD (gastroesophageal reflux disease)   . Headache    migraines  . PTSD (post-traumatic stress disorder)   . PTSD (post-traumatic stress disorder)   . Seizures (HCC)    febrile seizure at age 64 months  . Thyroid nodule   . Vaginal infection     Past Surgical History:  Procedure Laterality Date  . 25 GAUGE PARS PLANA VITRECTOMY WITH 20 GAUGE MVR PORT FOR MACULAR HOLE Right 12/10/2017   Procedure: PARS PLANA VITRECTOMY WITH 25 GAUGE LAZER AND GAS MACULAR HOLE membrane pale gas;  Surgeon: Rennis Chris, MD;  Location: [facility] OR;  Service: Ophthalmology;  Laterality: Right;  . COLONOSCOPY    . HERNIA REPAIR    . TONSILLECTOMY  2005  . TUBAL LIGATION      There were no vitals filed for this visit.  Subjective Assessment - 02/24/19 1620    Currently in Pain?  No/denies       S: "I get very irritable in conversation"   O: Education given on verbal communication skills this date. Fair fighting rules discussed in reference to a variety of relationships. Pt asked to apply certain rules to a current situation in life. Continued education to be given next date.  A: Pt presents with flat affect, engaged and participatory throughout session this date. Pt shares that she experiences communication issues in her personal life and at work. Pt mostly uses examples from her previous marriage. Pt shares she would like to use the skill of "only bringing up one issue at a time" to improve her communication.  P: OT group will be x3 per week while pt in PHP.  OT Education - 02/24/19 1620    Education Details  education given on communication skills    Person(s) Educated  Patient    Methods  Explanation;Handout    Comprehension  Verbalized understanding       OT Short Term Goals - 02/16/19 1428      OT SHORT TERM GOAL #1   Title  Pt will be educated on strategies to improve psychosocial skills needed to participate fully in all daily, work, and leisure activities    Time  4    Period  Weeks    Status  On-going    Target Date  03/15/19      OT SHORT TERM GOAL #2   Title  Pt will apply psychosocial skills and coping mechanisms to  daily activities in order to function independently and reintegrate into community    Time  4    Period  Weeks    Status  On-going    Target Date  03/15/19      OT SHORT TERM GOAL #3   Title  Pt will engage in goal setting to improve functional BADL/IADL routine upon reintegrating into community    Time  4    Period  Weeks    Status  On-going    Target Date  03/15/19      OT SHORT TERM GOAL #4   Title  Pt will recall and/or apply 1-3 sleep hygiene strategies to improve functional BADL engagement upon reintegrating into community    Time  4    Period  Weeks    Status  On-going               Plan - 02/24/19 1622    Occupational performance deficits (Please refer to evaluation for details):  ADL's;IADL's;Rest and Sleep;Work;Leisure;Social Participation    Body Structure / Function / Physical Skills  ADL    Cognitive Skills  Attention    Psychosocial Skills  Coping Strategies;Routines and Behaviors;Habits;Interpersonal Interaction;Environmental  Adaptations       Patient will benefit from skilled therapeutic intervention in order to improve the following deficits and impairments:  Cognitive Skills, Body Structure / Function / Physical Skills, Psychosocial Skills  Visit Diagnosis: MDD (major depressive disorder), recurrent, severe, with psychosis (HCC)  PTSD (post-traumatic stress disorder)  GAD (generalized anxiety disorder)  Difficulty coping    Problem List Patient Active Problem List   Diagnosis Date Noted  . Disease of thyroid gland 03/15/2018  . Migraine 03/15/2018  . Lichen sclerosus et atrophicus 05/30/2015   Dalphine HandingKaylee Liridona Mashaw, MSOT, OTR/L Behavioral Health OT/ Acute Relief OT PHP Office: [phone]  Dalphine HandingKaylee Malonie Tatum 02/24/2019, 4:23 PM  [facility] [address] [place], [place], [address] Phone: [phone]   Fax:  [phone]  Name: Sandra CockayneLaurie A Herring MRN: [id number] Date of Birth:  08-06-1961

## 2019-02-24 NOTE — Psych (Signed)
Virtual Visit via Video Note  I connected with Sandra Herring on 02/24/19 at  9:00 AM EDT by a video enabled telemedicine application and verified that I am speaking with the correct person using two identifiers.   I discussed the limitations of evaluation and management by telemedicine and the availability of in person appointments. The patient expressed understanding and agreed to proceed.   I discussed the assessment and treatment plan with the patient. The patient was provided an opportunity to ask questions and all were answered. The patient agreed with the plan and demonstrated an understanding of the instructions.   The patient was advised to call back or seek an in-person evaluation if the symptoms worsen or if the condition fails to improve as anticipated.  Pt was provided 240 minutes of non-face-to-face time during this encounter.   [name], LCSW      [facility] BH PHP THERAPIST PROGRESS NOTE  Sandra Herring [id number]  Session Time: 9:00 - 10:00  Participation Level: Active  Behavioral Response: CasualAlertDepressed  Type of Therapy: Group Therapy  Treatment Goals addressed: Coping  Interventions: CBT, DBT, Supportive and Reframing  Summary: Clinician led check-in regarding current stressors and situation, and review of patient completed daily inventory. Clinician utilized active listening and empathetic response and validated patient emotions. Clinician facilitated processing group on pertinent issues.   Therapist Response: Sandra Herring is a 58 y.o. female who presents with depression, anxiety, and  trauma symptoms. Patient arrived within time allowed and reports that she is feeling "a lot of anxiety." Patient rates her mood at a 5 on a scale of 1-10 with 10 being great. Pt reports she "can't stop shaking" however it is not observable and pt laer becomes unclear of when or how often she has experienced shaking. Pt reports feeling overwhelmed this morning  after receiving communication that her short term disability claim is still in process and that she continued to spiral about the "state of the world" and how "wounded" her brain is. Pt states she did not take Hydroxizine last night, however her rash continues to improve slowly. Pt reports she did mix paint yesterday and that she was able to "focus on the joy" which was a relief to her. Pt reports continued struggle with ruminating about work. Pt able to process. Patient engaged in discussion.     Session Time: 10:00 -11:00  Participation Level: Active  Behavioral Response: CasualAlertDepressed  Type of Therapy: Group Therapy, psychoeducation, psychotherapy  Treatment Goals addressed: Coping  Interventions: CBT, DBT, Solution Focused, Supportive and Reframing  Summary: Cln led discussion on moving from "why" to "what now." Group discussed the ways in which focusing on "why?" can cause disruptions in progress and create barriers to moving forward.   Therapist Response: Pt reports struggle with new awareness about her childhood. Pt states while she always knew her childhood was toxic, abusive, and invalidating, she hadn't realized that it shaped who she is as a person. Pt reports feeling she cannot move forward or "fix" herself until she understands how to "write over the past." Pt is tearful and at times loud while sharing. Pt reports understanding of it not being necessary to "solve" the past before addressing the future however struggles to accept it as evidenced by making continued statements stuck in past-oriented thinking. Pt acknowledges that  focusing on "what now" could decrease distress of having to think about the past and keep her looking forward. Pt continues to present with low insight as evidenced by  discussing a similar topic as other days this week, without recognizing the connection or repetition. Pt is labile throughout session. Pt provides and accepts feedback from  group.     Session Time: 11:00 - 12:00   Participation Level: Active   Behavioral Response: CasualAlertDepressed   Type of Therapy: Group Therapy, OT   Treatment Goals addressed: Coping   Interventions: Psychosocial skills training, Supportive,    Summary:  Occupational Therapy group   Therapist Response: Patient engaged in group. See OT note.        Session Time: 12:00- 1:00  Participation Level: Active  Behavioral Response: CasualAlertDepressed  Type of Therapy: Group Therapy, Psychoeducation; Psychotherapy  Treatment Goals addressed: Coping  Interventions: CBT; Solution focused; Supportive; Reframing  Summary: 12:00 - 12:50 Cln introduced topic of boundaries. Cln provided education on what boundaries are and how affect our lives. Cln led discussion on how to move boundaries from a passive to an active process in our lives.  12:50 -1:00 Clinician led check-out. Clinician assessed for immediate needs, medication compliance and efficacy, and safety concerns   Therapist Response: Pt reports loose understanding of topic stating "I think I get it. Once I learn more I think it will get clearer." Pt identifies that she has poor boundaries relating to 11/12 common boundary issues.  At checkout, pt rates her mood at a 5 on a scale of 1-10 with 10 being great. Patient reports afternoon plans of reviewing notes and resting. Patient demonstrates some progress as evidenced by observable effort by pt to reconsider her thought processes. Patient denies SI/HI/self-harm at the end of group.    Suicidal/Homicidal: Nowithout intent/plan   Plan: Pt will continue in PHP while working to decrease symptoms and increase ability to manage symptoms in a healthy manner.    Diagnosis: MDD (major depressive disorder), recurrent, severe, with psychosis (HCC) [F33.3]    1. MDD (major depressive disorder), recurrent, severe, with psychosis (HCC)   2. PTSD (post-traumatic stress disorder)    3. GAD (generalized anxiety disorder)       Donia GuilesJenny Baila Rouse, LCSW 02/24/2019

## 2019-02-25 ENCOUNTER — Other Ambulatory Visit (HOSPITAL_COMMUNITY): Payer: 59 | Attending: Psychiatry | Admitting: Licensed Clinical Social Worker

## 2019-02-25 ENCOUNTER — Other Ambulatory Visit: Payer: Self-pay

## 2019-02-25 ENCOUNTER — Encounter (HOSPITAL_COMMUNITY): Payer: Self-pay

## 2019-02-25 ENCOUNTER — Ambulatory Visit (HOSPITAL_COMMUNITY): Payer: 59

## 2019-02-25 ENCOUNTER — Other Ambulatory Visit (HOSPITAL_COMMUNITY): Payer: 59 | Admitting: Occupational Therapy

## 2019-02-25 DIAGNOSIS — F4310 Post-traumatic stress disorder, unspecified: Secondary | ICD-10-CM

## 2019-02-25 DIAGNOSIS — Z886 Allergy status to analgesic agent status: Secondary | ICD-10-CM | POA: Insufficient documentation

## 2019-02-25 DIAGNOSIS — F333 Major depressive disorder, recurrent, severe with psychotic symptoms: Secondary | ICD-10-CM | POA: Insufficient documentation

## 2019-02-25 DIAGNOSIS — F17210 Nicotine dependence, cigarettes, uncomplicated: Secondary | ICD-10-CM | POA: Diagnosis not present

## 2019-02-25 DIAGNOSIS — Z7952 Long term (current) use of systemic steroids: Secondary | ICD-10-CM | POA: Insufficient documentation

## 2019-02-25 DIAGNOSIS — K219 Gastro-esophageal reflux disease without esophagitis: Secondary | ICD-10-CM | POA: Diagnosis not present

## 2019-02-25 DIAGNOSIS — F411 Generalized anxiety disorder: Secondary | ICD-10-CM

## 2019-02-25 DIAGNOSIS — Z734 Inadequate social skills, not elsewhere classified: Secondary | ICD-10-CM | POA: Insufficient documentation

## 2019-02-25 DIAGNOSIS — Z79899 Other long term (current) drug therapy: Secondary | ICD-10-CM | POA: Insufficient documentation

## 2019-02-25 DIAGNOSIS — Z888 Allergy status to other drugs, medicaments and biological substances status: Secondary | ICD-10-CM | POA: Insufficient documentation

## 2019-02-25 DIAGNOSIS — R4589 Other symptoms and signs involving emotional state: Secondary | ICD-10-CM

## 2019-02-25 DIAGNOSIS — Z818 Family history of other mental and behavioral disorders: Secondary | ICD-10-CM | POA: Diagnosis not present
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
## 2019-02-25 NOTE — Psych (Signed)
Virtual Visit via Video Note  I connected with Sandra Herring on 02/25/19 at  9:00 AM EDT by a video enabled telemedicine application and verified that I am speaking with the correct person using two identifiers.   I discussed the limitations of evaluation and management by telemedicine and the availability of in person appointments. The patient expressed understanding and agreed to proceed.   I discussed the assessment and treatment plan with the patient. The patient was provided an opportunity to ask questions and all were answered. The patient agreed with the plan and demonstrated an understanding of the instructions.   The patient was advised to call back or seek an in-person evaluation if the symptoms worsen or if the condition fails to improve as anticipated.  Pt was provided 240 minutes of non-face-to-face time during this encounter.   Donia GuilesJenny Darrion Macaulay, LCSW      [facility] PHP THERAPIST PROGRESS NOTE  Sandra Herring [id number]  Session Time: 9:00 - 10:00  Participation Level: Active  Behavioral Response: CasualAlertDepressed  Type of Therapy: Group Therapy  Treatment Goals addressed: Coping  Interventions: CBT, DBT, Supportive and Reframing  Summary: Clinician led check-in regarding current stressors and situation, and review of patient completed daily inventory. Clinician utilized active listening and empathetic response and validated patient emotions. Clinician facilitated processing group on pertinent issues.   Therapist Response: Sandra Herring is a 58 y.o. female who presents with depression, anxiety, and  trauma symptoms. Patient arrived within time allowed and reports that she is feeling "productive." Patient rates her mood at a 7 on a scale of 1-10 with 10 being great. Pt reports she has had a "really good" morning and was able to take her dog on a walk and be in nature; picked up cigarettes and a coffee; and spoke with her neighbor for a few minutes. Pt  reports feeling as if she has achieved more this morning than in the past week. Pt is noticeably brighter and bubblier in tone. Pt reports yesterday afternoon went well and she ate, napped, and worked on photographing her art. Pt struggled with focusing on herself during this session due to being in a good mood and mood dependent thinking. Pt able to process. Patient engaged in discussion.     Session Time: 10:00 -11:00  Participation Level: Active  Behavioral Response: CasualAlertDepressed  Type of Therapy: Group Therapy, psychoeducation, psychotherapy  Treatment Goals addressed: Coping  Interventions: CBT, DBT, Solution Focused, Supportive and Reframing  Summary: Cln led discussion on making well informed decisions. Cln discussed the importance of looking at pros and cons of each situation and making a decision with acceptance of both. Group shared ways in which this is a struggle for them and/or has presented in their life.   Therapist Response: Pt continued to struggle with moving past her current good mood to engage with topic on a personal level. Pt is active in feedback and support of other group members.      Session Time: 11:00 - 12:00   Participation Level: Active   Behavioral Response: CasualAlertDepressed   Type of Therapy: Group Therapy, OT   Treatment Goals addressed: Coping   Interventions: Psychosocial skills training, Supportive,    Summary:  Occupational Therapy group   Therapist Response: Patient engaged in group. See OT note.        Session Time: 12:00- 1:00  Participation Level: Active  Behavioral Response: CasualAlertDepressed  Type of Therapy: Group Therapy, Psychoeducation; Psychotherapy  Treatment Goals addressed: Coping  Interventions:  CBT; Solution focused; Supportive; Reframing  Summary: 12:00 - 12:50 Clinician continued discussion on boundaries. Clinician reviewed information discussed yesterday. Group discussed characteristics of  rigid, porous, and healthy boundaries. Cln provided education on the different ways boundaries can present including material, time, physical, and emotional and examples of each presentation.  12:50 -1:00 Clinician led check-out. Clinician assessed for immediate needs, medication compliance and efficacy, and safety concerns   Therapist Response: Pt states understanding of different boundaries and shares she has a struggle with emotional boundaries.  At checkout, pt rates her mood at a 7 on a scale of 1-10 with 10 being great. Patient reports weekend plans of painting. Patient demonstrates some progress as evidenced by improved mood. Patient denies SI/HI/self-harm at the end of group.    Suicidal/Homicidal: Nowithout intent/plan   Plan: Pt will continue in PHP while working to decrease symptoms and increase ability to manage symptoms in a healthy manner.    Diagnosis: MDD (major depressive disorder), recurrent, severe, with psychosis (HCC) [F33.3]    1. MDD (major depressive disorder), recurrent, severe, with psychosis (HCC)   2. PTSD (post-traumatic stress disorder)   3. GAD (generalized anxiety disorder)       [name], LCSW 02/25/2019
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
## 2019-02-28 ENCOUNTER — Other Ambulatory Visit: Payer: Self-pay

## 2019-02-28 ENCOUNTER — Other Ambulatory Visit (HOSPITAL_COMMUNITY): Payer: 59 | Admitting: Licensed Clinical Social Worker

## 2019-02-28 ENCOUNTER — Other Ambulatory Visit (HOSPITAL_COMMUNITY): Payer: 59

## 2019-02-28 ENCOUNTER — Encounter (HOSPITAL_COMMUNITY): Payer: Self-pay | Admitting: Family

## 2019-02-28 ENCOUNTER — Ambulatory Visit (HOSPITAL_COMMUNITY): Payer: 59

## 2019-02-28 DIAGNOSIS — F333 Major depressive disorder, recurrent, severe with psychotic symptoms: Secondary | ICD-10-CM | POA: Diagnosis not present

## 2019-02-28 DIAGNOSIS — F4310 Post-traumatic stress disorder, unspecified: Secondary | ICD-10-CM

## 2019-02-28 DIAGNOSIS — F411 Generalized anxiety disorder: Secondary | ICD-10-CM

## 2019-03-01 ENCOUNTER — Ambulatory Visit (HOSPITAL_COMMUNITY): Payer: 59

## 2019-03-01 ENCOUNTER — Other Ambulatory Visit (HOSPITAL_COMMUNITY): Payer: 59 | Admitting: Occupational Therapy

## 2019-03-01 ENCOUNTER — Encounter (HOSPITAL_COMMUNITY): Payer: Self-pay

## 2019-03-01 ENCOUNTER — Other Ambulatory Visit: Payer: Self-pay

## 2019-03-01 ENCOUNTER — Other Ambulatory Visit (HOSPITAL_COMMUNITY): Payer: 59 | Admitting: Licensed Clinical Social Worker

## 2019-03-01 DIAGNOSIS — F4310 Post-traumatic stress disorder, unspecified: Secondary | ICD-10-CM

## 2019-03-01 DIAGNOSIS — F333 Major depressive disorder, recurrent, severe with psychotic symptoms: Secondary | ICD-10-CM

## 2019-03-01 DIAGNOSIS — F411 Generalized anxiety disorder: Secondary | ICD-10-CM

## 2019-03-01 DIAGNOSIS — R4589 Other symptoms and signs involving emotional state: Secondary | ICD-10-CM

## 2019-03-01 NOTE — Progress Notes (Signed)
Met with patient through virtual Webex as she presented with anxious affect, continued labile mood and denial of any suicidal or homicidal ideations, no auditory or visual hallucinations, and no plans to harm self or others.  Patient reported the whole process of PHP has been quite "enlightening" to her and states for the first time in 67 years she feels she has finally gotten some tools to help her better manage her life, to communicate with others with and to also help her identify her own authentic self.  Patient reported she may not want to return to her previous job and is open to trying some new things if this does not occur.  Patient still reports these new enlightening thoughts are based in her faith and she has been able to see that perhaps her values have not be in alignment with her job and with years of having to make decisions for herself and others she felt unequipped to make.  Patient reported she does sleep okay at night once she goes to sleep but still some problems going to sleep due to often racing or ruminating thoughts.  Patient reported she does feel she is doing better and wants to transition to IOP for continued learning of skills and to practice those she has learned in [facility].  Patient rated her current level of depression a 5 and anxiety a 7 on a scale of 0-10 with 0 being none and 10 the worst she could manage.  States she does feel hopeful for the future and wanted this nurse to thank Dr. Adele Schilder for referring her to [facility] as she stated feeling so strongly this has been one of the best things she has ever participated in to learn more about herself.  Patient again denied any suicidal or homicidal ideations,stating "I have no desire to harm myself or others" and denied any current problems with medications.  Patient stated she does not want to ever do without Valium since she has been using it for years and still feels it it helpful and needed.  However, patient also believes that perhaps the  years of Valium use have lead her to daily feeling she has a "foggy brain" and wonders if she would feel this way if off Valium.  Patient reported she would have to go into a full medical detox to stop Valium at this time and does not want to do this currently.   States she thinks she may have ADHD as she consistently states problems with staying focused but does feel for the first time in years she has found herself organizing more making decisions more for herself with current medications and PHP skills learned.  Patient reports being prepared for transition to IOP in the coming week, scored a 15 on her PHQ9 depression screening, down from 19 when she started PHP on 02/14/19.  Patient agreed to inform this nurse, [facility] staff or Ricky Ala, NP if any problems with mood, thoughts, or medications while transitioning to the intensive outpatient program over the coming week.

## 2019-03-01 NOTE — Therapy (Signed)
[facility] PARTIAL HOSPITALIZATION PROGRAM [address] [place], [place], [address] Phone: [phone]   Fax:  [phone]  Occupational Therapy Treatment  Patient Details  Name: Amanda CockayneLaurie A Broce MRN: [id number] Date of Birth: [birth year] Referring Provider (OT): Hillery Jacksanika Lewis, NP  Virtual Visit via Video Note  I connected with Amanda CockayneLaurie A Dunsworth on 03/01/19 at  8:00 AM EDT by a video enabled telemedicine application and verified that I am speaking with the correct person using two identifiers.   I discussed the limitations of evaluation and management by telemedicine and the availability of in person appointments. The patient expressed understanding and agreed to proceed.   I discussed the assessment and treatment plan with the patient. The patient was provided an opportunity to ask questions and all were answered. The patient agreed with the plan and demonstrated an understanding of the instructions.   The patient was advised to call back or seek an in-person evaluation if the symptoms worsen or if the condition fails to improve as anticipated.  I provided 60 minutes of non-face-to-face time during this encounter.  Dalphine HandingKaylee Arabia Nylund, MSOT, OTR/L Behavioral Health OT/ Acute Relief OT PHP Office: [phone]   Dalphine HandingKaylee Marzetta Lanza, [place]    Encounter Date: 03/01/2019  OT End of Session - 03/01/19 1442    Visit Number  9    Number of Visits  16    Date for OT Re-Evaluation  03/15/19    Authorization Type  [employer]    OT Start Time  1100    OT Stop Time  1200    OT Time Calculation (min)  60 min    Activity Tolerance  Patient tolerated treatment well    Behavior During Therapy  WFL for tasks assessed/performed       Past Medical History:  Diagnosis Date  . Anxiety   . Depression   . GERD (gastroesophageal reflux disease)   . Headache    migraines  . PTSD (post-traumatic stress disorder)   . PTSD (post-traumatic stress disorder)   . Seizures (HCC)    febrile seizure at age 58 years  . Thyroid nodule   . Vaginal infection     Past Surgical History:  Procedure Laterality Date  . 25 GAUGE PARS PLANA VITRECTOMY WITH 20 GAUGE MVR PORT FOR MACULAR HOLE Right 12/10/2017   Procedure: PARS PLANA VITRECTOMY WITH 25 GAUGE LAZER AND GAS MACULAR HOLE membrane pale gas;  Surgeon: Rennis ChrisZamora, Brian, MD;  Location: [facility] OR;  Service: Ophthalmology;  Laterality: Right;  . COLONOSCOPY    . HERNIA REPAIR    . TONSILLECTOMY  2005  . TUBAL LIGATION      There were no vitals filed for this visit.  Subjective Assessment - 03/01/19 1440    Currently in Pain?  No/denies        S: "Communication really bothers me in the context of work"   O: OT treatment focus on communication skills, with emphasis of session on active listening. Pt received handout to guide understanding of definition of active listening and how to improve skills this date. Pt asked to share personal experiences and examples throughout session. Continued education to be given next OT treatment date.   A: Pt presented to group with flat affect, engaged and participatory throughout session.Pt received active listening handout, in understanding of ways to improve current practices. Pt becoming very dominant and over taking group conversation in relation to a previous work situation of hers. Reframed pt situation to be general for all  group members to have an open discussion on professional communication. Pt continuing to monopolize conversation with difficult time redirecting her without very direct measures to cease conversation. During this time pt becoming very tearful and upset. Counselor intervened for further management.  P: Pt provided with communication skills to implement when reintegrating into community dwelling. OT will continue follow up with active listening next OT treatment date for 3 per week of OT while in PHP.                    OT Education - 03/01/19 1440     Education Details  continued education given on communication skills    Person(s) Educated  Patient    Methods  Explanation;Handout    Comprehension  Verbalized understanding       OT Short Term Goals - 02/16/19 1428      OT SHORT TERM GOAL #1   Title  Pt will be educated on strategies to improve psychosocial skills needed to participate fully in all daily, work, and leisure activities    Time  4    Period  Weeks    Status  On-going    Target Date  03/15/19      OT SHORT TERM GOAL #2   Title  Pt will apply psychosocial skills and coping mechanisms to daily activities in order to function independently and reintegrate into community    Time  4    Period  Weeks    Status  On-going    Target Date  03/15/19      OT SHORT TERM GOAL #3   Title  Pt will engage in goal setting to improve functional BADL/IADL routine upon reintegrating into community    Time  4    Period  Weeks    Status  On-going    Target Date  03/15/19      OT SHORT TERM GOAL #4   Title  Pt will recall and/or apply 1-3 sleep hygiene strategies to improve functional BADL engagement upon reintegrating into community    Time  4    Period  Weeks    Status  On-going               Plan - 03/01/19 1443    Occupational performance deficits (Please refer to evaluation for details):  ADL's;IADL's;Rest and Sleep;Work;Leisure;Social Participation    Body Structure / Function / Physical Skills  ADL    Cognitive Skills  Attention    Psychosocial Skills  Coping Strategies;Routines and Behaviors;Habits;Interpersonal Interaction;Environmental  Adaptations       Patient will benefit from skilled therapeutic intervention in order to improve the following deficits and impairments:  Cognitive Skills, Body Structure / Function / Physical Skills, Psychosocial Skills  Visit Diagnosis: MDD (major depressive disorder), recurrent, severe, with psychosis (HCC)  PTSD (post-traumatic stress disorder)  GAD (generalized  anxiety disorder)  Difficulty coping    Problem List Patient Active Problem List   Diagnosis Date Noted  . Disease of thyroid gland 03/15/2018  . Migraine 03/15/2018  . Lichen sclerosus et atrophicus 05/30/2015   Dalphine Handing, MSOT, OTR/L Behavioral Health OT/ Acute Relief OT PHP Office: [phone]  Dalphine Handing 03/01/2019, 2:48 PM  [facility] PARTIAL HOSPITALIZATION PROGRAM [address] [place], [place], [address] Phone: [phone]   Fax:  [phone]  Name: NOON SIPP MRN: [id number] Date of Birth: 01/20/1961

## 2019-03-02 ENCOUNTER — Ambulatory Visit (HOSPITAL_COMMUNITY): Payer: Self-pay

## 2019-03-02 ENCOUNTER — Ambulatory Visit (HOSPITAL_COMMUNITY): Payer: 59

## 2019-03-02 ENCOUNTER — Encounter (HOSPITAL_COMMUNITY): Payer: Self-pay | Admitting: Family

## 2019-03-02 ENCOUNTER — Other Ambulatory Visit: Payer: Self-pay

## 2019-03-02 ENCOUNTER — Other Ambulatory Visit (HOSPITAL_COMMUNITY): Payer: 59 | Admitting: Licensed Clinical Social Worker

## 2019-03-02 DIAGNOSIS — F411 Generalized anxiety disorder: Secondary | ICD-10-CM

## 2019-03-02 DIAGNOSIS — F333 Major depressive disorder, recurrent, severe with psychotic symptoms: Secondary | ICD-10-CM

## 2019-03-02 DIAGNOSIS — F4310 Post-traumatic stress disorder, unspecified: Secondary | ICD-10-CM

## 2019-03-02 NOTE — Psych (Signed)
Virtual Visit via Video Note  I connected with Sandra Herring on 02/28/19 at  9:00 AM EDT by a video enabled telemedicine application and verified that I am speaking with the correct person using two identifiers.   I discussed the limitations of evaluation and management by telemedicine and the availability of in person appointments. The patient expressed understanding and agreed to proceed.   I discussed the assessment and treatment plan with the patient. The patient was provided an opportunity to ask questions and all were answered. The patient agreed with the plan and demonstrated an understanding of the instructions.   The patient was advised to call back or seek an in-person evaluation if the symptoms worsen or if the condition fails to improve as anticipated.  Pt was provided 240 minutes of non-face-to-face time during this encounter.   Sandra Guiles, LCSW      [facility] BH PHP THERAPIST PROGRESS NOTE  Sandra Herring [id number]  Session Time: 9:00 - 10:00  Participation Level: Active  Behavioral Response: CasualAlertDepressed  Type of Therapy: Group Therapy  Treatment Goals addressed: Coping  Interventions: CBT, DBT, Supportive and Reframing  Summary: Clinician led check-in regarding current stressors and situation, and review of patient completed daily inventory. Clinician utilized active listening and empathetic response and validated patient emotions. Clinician facilitated processing group on pertinent issues.   Therapist Response: Sandra Herring is a 58 y.o. female who presents with depression, anxiety, and  trauma symptoms. Patient arrived within time allowed and reports that she is feeling "lighter." Patient rates her mood at a 7 on a scale of 1-10 with 10 being great. Pt reports she "woke up happy for the first time in my life" on Sunday. Pt reports feeling like what she is learning in group is "starting to make sense in a big way." Pt states she meditated on  Saturday and felt her "splintered soul meld" and was filled with light and peace. Pt reports feeling energy and vibrancy in a new way. Pt reports spending other parts of her weekend, cleaning, napping, talking to her family, and doing art. Pt able to process. Patient engaged in discussion.     Session Time: 10:00 -11:00  Participation Level: Active  Behavioral Response: CasualAlertDepressed  Type of Therapy: Group Therapy, psychoeducation, psychotherapy  Treatment Goals addressed: Coping  Interventions: CBT, DBT, Solution Focused, Supportive and Reframing  Summary: Cln introduced concept of balance and how to balance two "opposites" by allowing contradicting ideas to live together, such as I love you and you annoy me right now. Cln included discussion of perception and the reminder that the problem is not always the problem, but instead it is how we are viewing the problem that is causing issues. Group discussed how this may be creating obstacles in their lives.  Therapist Response: Pt reports recognition that balance is what has been lacking in her life "for a really long time." Pt states that being at a job that "crushed her soul" has stopped her from being "whole." Pt struggles to recognize her own accountability and agency in her life being out of balance and continues to have an external locus of control.      Session Time: 11:00 - 12:00   Participation Level: Active   Behavioral Response: CasualAlertDepressed   Type of Therapy: Group Therapy, OT   Treatment Goals addressed: Coping   Interventions: Psychosocial skills training, Supportive,    Summary:  Occupational Therapy group   Therapist Response: Patient engaged in group. See OT  note.        Session Time: 12:00- 1:00  Participation Level: Active  Behavioral Response: CasualAlertDepressed  Type of Therapy: Group Therapy, Psychoeducation; Psychotherapy  Treatment Goals addressed: Coping  Interventions:  CBT; Solution focused; Supportive; Reframing  Summary: 12:00 - 12:50 Cln continued topic of boundaries, introducing how to set boundaries. Cln reviewed the importance of recognizing values, communicating needs, and using assertiveness when setting a boundary. Group practiced setting boundaries with basic examples.  12:50 -1:00 Clinician led check-out. Clinician assessed for immediate needs, medication compliance and efficacy, and safety concerns   Therapist Response: Pt reports understanding of setting a boundary, however struggles with using boundaries and people doing things that upset her synonymously. Pt states she will work on voicing her boundaries appropriately before enforcing violations.  At checkout, pt rates her mood at a 8 on a scale of 1-10 with 10 being great. Patient reports afternoon plans of running errands, working in her journal, playing with her dog, and making dinner. Patient demonstrates some progress as evidenced by recognizing positivity can lie ahead. Patient denies SI/HI/self-harm at the end of group.    Suicidal/Homicidal: Nowithout intent/plan   Plan: Pt will continue in PHP while working to decrease symptoms and increase ability to manage symptoms in a healthy manner.    Diagnosis: MDD (major depressive disorder), recurrent, severe, with psychosis (HCC) [F33.3]    1. MDD (major depressive disorder), recurrent, severe, with psychosis (HCC)   2. PTSD (post-traumatic stress disorder)   3. GAD (generalized anxiety disorder)       Sandra GuilesJenny Angelli Baruch, LCSW 03/02/2019

## 2019-03-02 NOTE — Progress Notes (Signed)
Virtual Visit via Telephone Note  I connected with [name] on 03/02/19 at  9:00 AM EDT by telephone and verified that I am speaking with the correct person using two identifiers.   I discussed the limitations, risks, security and privacy concerns of performing an evaluation and management service by telephone and the availability of in person appointments. I also discussed with the patient that there may be a patient responsible charge related to this service. The patient expressed understanding and agreed to proceed.    I discussed the assessment and treatment plan with the patient. The patient was provided an opportunity to ask questions and all were answered. The patient agreed with the plan and demonstrated an understanding of the instructions.   The patient was advised to call back or seek an in-person evaluation if the symptoms worsen or if the condition fails to improve as anticipated.  I provided  minutes of non-face-to-face time during this encounter.   [name], NP   [facility] Discharge Summary  JAKEYA STERKEL [id number]  Admission date: 02/11/2019 Discharge date: 03/02/2019  Reason for admission: Per admission assessment note:-Baila Cottongim 58 year old Caucasian female presents with worsening anxiety and depression related to work stressors.  Patient reports she was on her final warning due to praying and overstepping her boundaries with patient while at work.  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  Ailah reports " I have to do the will of the Lord."  Patient reports lack of focus with difficulty concentrating.  States she is currently followed by psychiatry as [facility] where she is prescribed Zoloft and recently started on Lamictal for mood stabilization.  Patient reports a history of depression, anxiety, PTSD  Chemical Use History: Denied    Family of Origin Issues: Patient reported  She is "rebuilding" relationships between her and her children . States he is hopeful that her son will continue to progress in the right direction after is rehabilitation.   Progress in Program Toward Treatment Goals- Ongoing, Kaisey has attended and participated with daily group sessions. Stated learning new coping skill and "self reflection". Patient reported she is no longer worried about her employer and the treatment she has received. 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  patient reported she will continue to take her Zoloft, however is not interested in starting any other medication at this time.   Progress (rationale):Stepping down to Insensitive Outpatient Programing  (IOP)  -Discontinue Lamictal due to reported skin irriation  Take all medications as prescribed. Keep all follow-up appointments as scheduled.  Do not consume alcohol or use illegal drugs while on prescription medications. Report any adverse effects from your medications to your primary care provider promptly.  In the event of recurrent symptoms or worsening symptoms, call 911, a crisis hotline, or go to the nearest emergency department for evaluation.   [name], NP 03/02/2019

## 2019-03-02 NOTE — Progress Notes (Addendum)
GROUP NOTE - spiritual care group 03/02/2019 11:00 - 12:00 ?Facilitated by Wilkie Aye, MDiv, BCC. Group was held via [employer] in compliance with COVID-19 precautions ? ? Group focused on topic of strength. ?Group members reflected on what thoughts and feelings emerge when they hear this topic. ?They then engaged in facilitated dialog around how strength is present in their lives. This dialog focused on representing what strength had been to them in their lives (images and patterns given) and what they saw as helpful in their life now (what they needed / wanted). ? ? Activity drew on narrative framework   Sandra Herring was present throughout group.  Engaged in facilitated discussion and activity.  Noted that strength was linked to "courage, resilience, willingness, and focus"  She engaged with other group members around the concept of "Battle fatigue" - redefining strength as ability to care for self.  Sandra Herring reflected on her history of carrying "guilt" and stated she felt this was strength in the past, as she felt she could "fix other" if she held on to feeling of responsibility for them.  She wishes to redefine strength as boundaries.     Burnis Kingfisher, MDiv, [facility]

## 2019-03-03 ENCOUNTER — Other Ambulatory Visit: Payer: Self-pay

## 2019-03-03 ENCOUNTER — Other Ambulatory Visit (HOSPITAL_COMMUNITY): Payer: 59 | Admitting: Occupational Therapy

## 2019-03-03 ENCOUNTER — Other Ambulatory Visit (HOSPITAL_COMMUNITY): Payer: 59 | Admitting: Licensed Clinical Social Worker

## 2019-03-03 ENCOUNTER — Encounter (HOSPITAL_COMMUNITY): Payer: Self-pay | Admitting: Occupational Therapy

## 2019-03-03 ENCOUNTER — Ambulatory Visit (HOSPITAL_COMMUNITY): Payer: 59

## 2019-03-03 DIAGNOSIS — F4310 Post-traumatic stress disorder, unspecified: Secondary | ICD-10-CM

## 2019-03-03 DIAGNOSIS — F333 Major depressive disorder, recurrent, severe with psychotic symptoms: Secondary | ICD-10-CM

## 2019-03-03 DIAGNOSIS — F411 Generalized anxiety disorder: Secondary | ICD-10-CM

## 2019-03-03 DIAGNOSIS — R4589 Other symptoms and signs involving emotional state: Secondary | ICD-10-CM

## 2019-03-03 NOTE — Therapy (Signed)
[facility] PARTIAL HOSPITALIZATION PROGRAM [address] [place], [place], [address] Phone: [phone]   Fax:  [phone]  Occupational Therapy Treatment  Patient Details  Name: Sandra Herring MRN: [id number] Date of Birth: Jun 16, 1961 Referring Provider (OT): Hillery Jacks, NP  Virtual Visit via Video Note  I connected with Sandra Herring on 03/03/19 at  8:00 AM EDT by a video enabled telemedicine application and verified that I am speaking with the correct person using two identifiers.   I discussed the limitations of evaluation and management by telemedicine and the availability of in person appointments. The patient expressed understanding and agreed to proceed.  I discussed the assessment and treatment plan with the patient. The patient was provided an opportunity to ask questions and all were answered. The patient agreed with the plan and demonstrated an understanding of the instructions.   The patient was advised to call back or seek an in-person evaluation if the symptoms worsen or if the condition fails to improve as anticipated.  I provided 60 minutes of non-face-to-face time during this encounter.  Sandra Handing, MSOT, OTR/L Behavioral Health OT/ Acute Relief OT PHP Office: [phone]  Sandra Handing, [place]    Encounter Date: 03/03/2019  OT End of Session - 03/03/19 1538    Visit Number  10    Number of Visits  16    Date for OT Re-Evaluation  03/15/19    Authorization Type  [employer]    OT Start Time  1100    OT Stop Time  1200    OT Time Calculation (min)  60 min    Activity Tolerance  Patient tolerated treatment well    Behavior During Therapy  WFL for tasks assessed/performed       Past Medical History:  Diagnosis Date  . Anxiety   . Depression   . GERD (gastroesophageal reflux disease)   . Headache    migraines  . PTSD (post-traumatic stress disorder)   . PTSD (post-traumatic stress disorder)   . Seizures (HCC)    febrile seizure at age 21 months  . Thyroid nodule   . Vaginal infection     Past Surgical History:  Procedure Laterality Date  . 25 GAUGE PARS PLANA VITRECTOMY WITH 20 GAUGE MVR PORT FOR MACULAR HOLE Right 12/10/2017   Procedure: PARS PLANA VITRECTOMY WITH 25 GAUGE LAZER AND GAS MACULAR HOLE membrane pale gas;  Surgeon: Rennis Chris, MD;  Location: [facility] OR;  Service: Ophthalmology;  Laterality: Right;  . COLONOSCOPY    . HERNIA REPAIR    . TONSILLECTOMY  2005  . TUBAL LIGATION      There were no vitals filed for this visit.  Subjective Assessment - 03/03/19 1538    Currently in Pain?  No/denies       S: "I need to use these more at work"   O: Continued education given on active listening skills in communication from previous date. Communication skills addressed in reference to social participation for community integration. Topic applied to various life scenarios for pt to conceptualize. Pt encouraged to share personal examples and feedback.  A: Pt presents with blunted affect, engaged and participatory throughout session. Pt sharing how these scenarios can be applicable for her to use in the work environment. Pt less fixated and monopolizing about the issue of work this date. She shares how validation is very important for her in a relationship and how she would like to be more proficient at using this skill herself.  At times pt still appearing focused on religion, for she expressed religious views on a tangent not pertaining to the topic, needing redirection and responding well.  P: OT group will be x3 per week while pt in PHP.                   OT Education - 03/03/19 1538    Education Details  continued education given on communication skills    Person(s) Educated  Patient    Methods  Explanation;Handout    Comprehension  Verbalized understanding       OT Short Term Goals - 03/03/19 1539      OT SHORT TERM GOAL #1   Title  Pt will be educated on  strategies to improve psychosocial skills needed to participate fully in all daily, work, and leisure activities    Time  4    Period  Weeks    Status  Achieved    Target Date  03/15/19      OT SHORT TERM GOAL #2   Title  Pt will apply psychosocial skills and coping mechanisms to daily activities in order to function independently and reintegrate into community    Time  4    Period  Weeks    Status  Achieved    Target Date  03/15/19      OT SHORT TERM GOAL #3   Title  Pt will engage in goal setting to improve functional BADL/IADL routine upon reintegrating into community    Time  4    Period  Weeks    Status  Achieved    Target Date  03/15/19      OT SHORT TERM GOAL #4   Title  Pt will recall and/or apply 1-3 sleep hygiene strategies to improve functional BADL engagement upon reintegrating into community    Time  4    Period  Weeks    Status  Achieved    Target Date  03/15/19               Plan - 03/03/19 1539    Occupational performance deficits (Please refer to evaluation for details):  ADL's;IADL's;Rest and Sleep;Work;Leisure;Social Participation    Body Structure / Function / Physical Skills  ADL    Cognitive Skills  Attention    Psychosocial Skills  Coping Strategies;Routines and Behaviors;Habits;Interpersonal Interaction;Environmental  Adaptations       Patient will benefit from skilled therapeutic intervention in order to improve the following deficits and impairments:  Cognitive Skills, Body Structure / Function / Physical Skills, Psychosocial Skills  Visit Diagnosis: MDD (major depressive disorder), recurrent, severe, with psychosis (HCC)  GAD (generalized anxiety disorder)  PTSD (post-traumatic stress disorder)  Difficulty coping    Problem List Patient Active Problem List   Diagnosis Date Noted  . Disease of thyroid gland 03/15/2018  . Migraine 03/15/2018  . Lichen sclerosus et atrophicus 05/30/2015   Sandra Herring, MSOT, OTR/L Behavioral  Health OT/ Acute Relief OT PHP Office: [phone]  Sandra Herring 03/03/2019, 3:41 PM  [facility] PART[facility] PROGRAM [address] [place], [place], [address] Phone: [phone]   Fax:  [phone]  Name: Sandra Herring MRN: [id number] Date of Birth: 11/13/1960

## 2019-03-04 ENCOUNTER — Ambulatory Visit (HOSPITAL_COMMUNITY): Payer: Self-pay

## 2019-03-04 ENCOUNTER — Ambulatory Visit (HOSPITAL_COMMUNITY): Payer: 59

## 2019-03-04 ENCOUNTER — Other Ambulatory Visit (HOSPITAL_COMMUNITY): Payer: Self-pay

## 2019-03-07 ENCOUNTER — Other Ambulatory Visit: Payer: Self-pay

## 2019-03-07 ENCOUNTER — Encounter (HOSPITAL_COMMUNITY): Payer: Self-pay | Admitting: Psychiatry

## 2019-03-07 ENCOUNTER — Other Ambulatory Visit (HOSPITAL_COMMUNITY): Payer: 59 | Admitting: Psychiatry

## 2019-03-07 DIAGNOSIS — F411 Generalized anxiety disorder: Secondary | ICD-10-CM

## 2019-03-07 DIAGNOSIS — F333 Major depressive disorder, recurrent, severe with psychotic symptoms: Secondary | ICD-10-CM
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
## 2019-03-08 ENCOUNTER — Other Ambulatory Visit (HOSPITAL_COMMUNITY): Payer: 59 | Admitting: Licensed Clinical Social Worker

## 2019-03-08 ENCOUNTER — Other Ambulatory Visit: Payer: Self-pay

## 2019-03-08 DIAGNOSIS — F333 Major depressive disorder, recurrent, severe with psychotic symptoms: Secondary | ICD-10-CM

## 2019-03-08 NOTE — Progress Notes (Signed)
Virtual Visit via Video Note  I connected with Sandra Herring on 03/08/19 at  9:00 AM EDT by a video enabled telemedicine application and verified that I am speaking with the correct person using two identifiers.   I discussed the limitations of evaluation and management by telemedicine and the availability of in person appointments. The patient expressed understanding and agreed to proceed.  I discussed the assessment and treatment plan with the patient. The patient was provided an opportunity to ask questions and all were answered. The patient agreed with the plan and demonstrated an understanding of the instructions.   The patient was advised to call back or seek an in-person evaluation if the symptoms worsen or if the condition fails to improve as anticipated.  I provided 180 minutes of non-face-to-face time during this encounter.   Harlon Ditty, LCSW     Daily Group Progress Note  Program: IOP  Group Time: 9am-12pm  Participation Level: Active  Behavioral Response: Sharing, Monopolizing and Motivated  Type of Therapy:  Group Therapy; process group, psycho-educational group  Summary of Progress:  The purpose of this group is to utilize CBT and DBT skills in a telehealth group setting to increase use of healthy coping skills and decrease frequency and intensity of active mental health symptoms.  9am-10:30am Clinician checked in with group members, assessing for SI/HI/psychosis and overall level of functioning. Clinician and clients processed stressors from the previous day and how they were addressed. Clinician and group members processed anger as a secondary emotion. Clinician and group members discussed how angry might be expressed based on what was learned growing up, and which emotions sometimes present as anger. Clinician presented values clarification activity with clients and processed results and their effect on decision making, communication styles, and  relationships. 10:30am-12pm Clinician presented CBT skills of assertive communication. Clinician and group members reviewed [employer] and engaging in discussions without escalating self or others. Clinician and group members practiced utilizing I-statements with [employer] Communication to identify and share personal thoughts, feelings, and needs while avoiding attacking others. Clinician checked out with group members inquiring about self-care activity planned for the day. Client presented reporting having an existential crisis of sense of self. Client required frequent re-direction and vacillated between being receptive to communication interventions and being positive they would not work in her work setting. Client provided support to group member related to addressing own behavior in place of attempting to change behavior of a person with addiction. Client was able to create I-statements with support of clinician and group member and identified communication could have been difficult due to client and employer having different priorities. Client would like it to be noted that she wants her boss' involvement in therapy to help her understand what is expected of her from work.  Harlon Ditty, LCSW

## 2019-03-09 ENCOUNTER — Other Ambulatory Visit (HOSPITAL_COMMUNITY): Payer: Self-pay | Admitting: Psychiatry

## 2019-03-09 ENCOUNTER — Other Ambulatory Visit: Payer: Self-pay

## 2019-03-09 ENCOUNTER — Telehealth (HOSPITAL_COMMUNITY): Payer: Self-pay | Admitting: Psychiatry

## 2019-03-09 ENCOUNTER — Encounter (HOSPITAL_COMMUNITY): Payer: Self-pay | Admitting: Family

## 2019-03-09 ENCOUNTER — Other Ambulatory Visit (HOSPITAL_COMMUNITY): Payer: 59 | Admitting: Licensed Clinical Social Worker

## 2019-03-09 DIAGNOSIS — F333 Major depressive disorder, recurrent, severe with psychotic symptoms: Secondary | ICD-10-CM | POA: Diagnosis not present

## 2019-03-09 DIAGNOSIS — F411 Generalized anxiety disorder: Secondary | ICD-10-CM

## 2019-03-09 DIAGNOSIS — F3110 Bipolar disorder, current episode manic without psychotic features, unspecified: Secondary | ICD-10-CM

## 2019-03-09 MED ORDER — OXCARBAZEPINE 150 MG PO TABS: 150.0000 mg | ORAL_TABLET | Freq: Two times a day (BID) | ORAL | 0 refills | Status: DC

## 2019-03-09 NOTE — Telephone Encounter (Signed)
D:  Hillery Jacks, NP said pt needs a referral from Dr. Lolly Mustache for TMS.  A:  Contacted Dr. Sheela Stack office for an appointment.  Scheduled pt to see Dr. Lolly Mustache virtually on 03-11-19 @ 11:30 a.m.  Inform pt, Hillery Jacks, NP and Everlene Balls, RN.

## 2019-03-09 NOTE — Progress Notes (Signed)
Virtual Visit via Video Note  I connected with Sandra Herring on 03/09/19 at  9:00 AM EDT by a video enabled telemedicine application and verified that I am speaking with the correct person using two identifiers.   I discussed the limitations of evaluation and management by telemedicine and the availability of in person appointments. The patient expressed understanding and agreed to proceed.   I discussed the assessment and treatment plan with the patient. The patient was provided an opportunity to ask questions and all were answered. The patient agreed with the plan and demonstrated an understanding of the instructions.   The patient was advised to call back or seek an in-person evaluation if the symptoms worsen or if the condition fails to improve as anticipated.  I provided 180 minutes of non-face-to-face time during this encounter.   [name], LCSW     Daily Group Progress Note  Program: IOP  Group Time: 9am-12pm  Participation Level: Active  Behavioral Response: Grandiose, Disruptive, Monopolizing and Minimizing  Type of Therapy:  Group Therapy; process group, psycho-educational group  Summary of Progress:   The purpose of this group is to utilize CBT and DBT skills in a group setting to increase use of healthy coping skills and decrease frequency and intensity of active mental health symptoms.  9am-10:30am Clinician checked in with group members, assessing for SI/HI/psychosis and overall level of functioning. Clinician inquired about self-care completed the previous day. Process group co-facilitated by Mable Fill, focused on losses and how they are grieved. 10:30am-12pm Clinician presented the topic of types of needs, and getting needs met. Clinician and group members identified needs, what keeps someone from asking for help. Client presents with labile mood and affect, worsening after discussion related to grief and loss. Clinician shared presentation with treatment  team and client was referred to NP for evaluation for possible medication changes or considerate for inpatient treatment. See NP note for additional details. 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 Client presented attempting to provide alternative pain management education to other group members. Client reports she has no problems asking for help but is often frustrated with the response she receives.  [name], LCSW

## 2019-03-09 NOTE — Telephone Encounter (Signed)
D:  Placed call to pt to inform her that Alcario Drought, NP states she would need a referral from Dr. Lolly Mustache for TMS.  Pt states she didn't mean to offend anyone by purchasing it.  "I was just advocating for myself."  A:  Provided pt with support.  Reiterated to pt that we want her to advocate for self, but we are concerned about her safety with trying to treat herself via TMS machine.  Inform Hillery Jacks, NP and Everlene Balls, RN.
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
## 2019-03-10 ENCOUNTER — Other Ambulatory Visit: Payer: Self-pay

## 2019-03-10 ENCOUNTER — Other Ambulatory Visit (HOSPITAL_COMMUNITY): Payer: 59 | Admitting: Licensed Clinical Social Worker

## 2019-03-10 DIAGNOSIS — F3110 Bipolar disorder, current episode manic without psychotic features, unspecified: Secondary | ICD-10-CM

## 2019-03-10 DIAGNOSIS — F333 Major depressive disorder, recurrent, severe with psychotic symptoms: Secondary | ICD-10-CM | POA: Diagnosis not present

## 2019-03-11 ENCOUNTER — Other Ambulatory Visit: Payer: Self-pay

## 2019-03-11 ENCOUNTER — Ambulatory Visit (INDEPENDENT_AMBULATORY_CARE_PROVIDER_SITE_OTHER): Payer: 59 | Admitting: Psychiatry

## 2019-03-11 ENCOUNTER — Encounter (HOSPITAL_COMMUNITY): Payer: Self-pay | Admitting: Psychiatry

## 2019-03-11 ENCOUNTER — Ambulatory Visit (HOSPITAL_COMMUNITY): Payer: 59

## 2019-03-11 DIAGNOSIS — F330 Major depressive disorder, recurrent, mild: Secondary | ICD-10-CM | POA: Diagnosis not present

## 2019-03-11 DIAGNOSIS — F4310 Post-traumatic stress disorder, unspecified: Secondary | ICD-10-CM

## 2019-03-11 DIAGNOSIS — F319 Bipolar disorder, unspecified: Secondary | ICD-10-CM | POA: Diagnosis not present

## 2019-03-11 DIAGNOSIS — G43709 Chronic migraine without aura, not intractable, without status migrainosus: Secondary | ICD-10-CM

## 2019-03-11 DIAGNOSIS — F411 Generalized anxiety disorder: Secondary | ICD-10-CM

## 2019-03-11 MED ORDER — SERTRALINE HCL 100 MG PO TABS: 150.0000 mg | ORAL_TABLET | Freq: Every day | ORAL | 0 refills | Status: DC

## 2019-03-11 NOTE — Progress Notes (Signed)
Virtual Visit via Telephone Note  I connected with Sandra Herring on 03/11/19 at 11:30 AM EDT by telephone and verified that I am speaking with the correct person using two identifiers.   I discussed the limitations, risks, security and privacy concerns of performing an evaluation and management service by telephone and the availability of in person appointments. I also discussed with the patient that there may be a patient responsible charge related to this service. The patient expressed understanding and agreed to proceed.   History of Present Illness: Patient was evaluated by phone session.  Patient is currently in IOP.  Initially she was recommended PHP but she could not handle and requested IOP.  Patient remains very emotional, easily tearful and having crying spells.  However she is very thankful that she have enough resources to deal with her psychiatric illness.  She still have episodes of insomnia and sometimes she takes trazodone.  She could not tolerate trazodone due to rash and it was discontinued.  She was recommended to take Trileptal however she has not started yet.  Now she wants to try TMS device from [employer].  Patient told she did have a lot of research and got clearance from their website with a virtual physician and she is excited about trying.  Patient like to come off from Valium which she has been taking for a while.  She feels the TMS may help her to stop the Valium.  She has cut down her Valium to take only 5 mg at bedtime.  She is no longer taking hydroxyzine which was given to help the itching and rash from Lamictal.  She has no more rash or itching.  She is taking Inderal 20 mg at bedtime to prevent migraine.  She is consistently taking Zoloft and 50 mg which she feels helping her depression.  Patient currently out of work as she cannot handle job at this time.  We have discussion in the past for inpatient but due to no support system to take care of her cats and  dogs she is very reluctant to do inpatient.  She also believes that she does not have suicidal thoughts and she can do treatment outpatient.  She denies any hallucination but continues to have anxiety, nervousness, highs and lows in her mood and crying spells.  She mentioned due to coping skills she is eating better and her self hygiene is much improved.  She is not involved in any self abusive behavior.  Past Psychiatric History: Reviewed. H/O depression, anxiety and PTSD since [id number] in [place] when living with ex-husband. Tried Paxil, Prozac, nortriptyline, amitriptyline, Wellbutrin, Depakote, Seroquel, Risperdal, Remeron, nefazodone and Cymbalta. Zoloft helped the most. H/O twice inpatient because of suicidal thoughts. H/O IOP. No history of suicidal attempt.     Observations/Objective: Mental status examination done on the phone.  Patient described her mood emotional and tearful.  Her thought process circumstantial.  Her speech is fast at times and rambling.  Her attention and concentration is fair.  She reported frustrated and irritable with her coworkers.  However she denies any active suicidal thoughts or homicidal thought.  She denies any auditory or visual hallucination.  There were no delusions present.  Her fund of knowledge is average.  Her cognition is intact.  Her insight and judgment is fair.  Assessment and Plan: Major depressive disorder, recurrent.  Generalized anxiety disorder.  Rule out bipolar disorder, depressed type.  I had a long discussion with the patient about starting  self treatment with TMS device.  Patient already purchased and does not want to change her decision.  She had searched the website and got clearance from the virtual doctor at [employer].  She has not started Trileptal yet.  I will discontinue Lamictal since patient is allergic and had rash.  Her rash is now resolved.  She like to continue Zoloft 150 mg daily and propranolol 20 mg at bedtime  to prevent migraine.  She also reduce her Valium 5 mg at bedtime and like to come off from the benzodiazepine in the future.  Rec to take trazodone 100 mg as needed for sleep.We discussed the possibility of underlying mood disorder.  I also suggest that she should try Abilify to help her depression and mood which she promised if TMS device did not help her then she will consider.  She has another week of IOP but like to extend more as she does not feel that she would be ready to go back to work in 1 week.  She feels IOP is helping her a lot and giving a lot of tools and coping skills.  I recommend that she should contact provider at IOP if she noticed any change in her behavior is starting TMS.  Patient agreed with the plan.  We discussed safety concern that anytime having active suicidal thoughts or homicidal thought that she need to call 911 or go to local emergency room.  We will follow-up once she finished the program.  Follow Up Instructions:    I discussed the assessment and treatment plan with the patient. The patient was provided an opportunity to ask questions and all were answered. The patient agreed with the plan and demonstrated an understanding of the instructions.   The patient was advised to call back or seek an in-person evaluation if the symptoms worsen or if the condition fails to improve as anticipated.  I provided 30 minutes of non-face-to-face time during this encounter.   [name], MD

## 2019-03-14 ENCOUNTER — Other Ambulatory Visit (HOSPITAL_COMMUNITY): Payer: 59 | Admitting: Psychiatry

## 2019-03-14 ENCOUNTER — Other Ambulatory Visit: Payer: Self-pay

## 2019-03-14 DIAGNOSIS — F4310 Post-traumatic stress disorder, unspecified: Secondary | ICD-10-CM

## 2019-03-14 DIAGNOSIS — F333 Major depressive disorder, recurrent, severe with psychotic symptoms: Secondary | ICD-10-CM | POA: Diagnosis not present

## 2019-03-14 DIAGNOSIS — F330 Major depressive disorder, recurrent, mild: Secondary | ICD-10-CM

## 2019-03-14 DIAGNOSIS — F411 Generalized anxiety disorder: Secondary | ICD-10-CM

## 2019-03-14 NOTE — Progress Notes (Signed)
Virtual Visit via Video Note  I connected with Sandra Herring on 03/10/2019 at  9:00 AM EDT by a video enabled telemedicine application and verified that I am speaking with the correct person using two identifiers.   I discussed the limitations of evaluation and management by telemedicine and the availability of in person appointments. The patient expressed understanding and agreed to proceed.   I discussed the assessment and treatment plan with the patient. The patient was provided an opportunity to ask questions and all were answered. The patient agreed with the plan and demonstrated an understanding of the instructions.   The patient was advised to call back or seek an in-person evaluation if the symptoms worsen or if the condition fails to improve as anticipated.  I provided 180 minutes of non-face-to-face time during this encounter.   [name], LCSW     Daily Group Progress Note  Program: IOP  Group Time: 9am-12pm  Participation Level: Active  Behavioral Response: Sharing, Rationalizing, Monopolizing and Minimizing  Type of Therapy:  Group Therapy; process group, psycho-educational group  Summary of Progress:  The purpose of this group is to utilize CBT and DBT skills in a group setting to increase use of healthy coping skills and decrease frequency and intensity of active mental health symtpoms. 9am-10:30am Clinician met with group members via telehealth to maintain health and safety measures due to pandemic. Clinician inquired about self care activities from the previous day. Clinician presented the topic of Healthy Boundaries. Clinician and group members reviewed types of boundaries, healthy vs unhealthy signs and discussed tips for setting healthy boundaries. Clinician and group members role played setting healthy boundaries with self, family, and co-workers. 10:30am-12pm Clinician and group members reviewed Personal Bill of Rights and discussed causes and  concerns with co-dependency. Client identified having poor boundaries at work, though felt others were disrespectful of boundaries as well. Client identified boundary concerns with daughter. Client was somewhat open to comments from group members and was receptive to some statements made by group members. Client continues to show limited insight into trouble with boundaries, seeing minimal problems with her boundaries and primarily blaming others for misunderstandings. Client did role play in session what/how things could be said to co-workers and daughter to get needs met and remain respectful using I-statements.  [name], LCSW 

## 2019-03-15 ENCOUNTER — Encounter (HOSPITAL_COMMUNITY): Payer: Self-pay

## 2019-03-15 ENCOUNTER — Other Ambulatory Visit: Payer: Self-pay

## 2019-03-15 ENCOUNTER — Other Ambulatory Visit (HOSPITAL_COMMUNITY): Payer: 59 | Admitting: Licensed Clinical Social Worker

## 2019-03-15 DIAGNOSIS — F411 Generalized anxiety disorder: Secondary | ICD-10-CM

## 2019-03-15 DIAGNOSIS — F330 Major depressive disorder, recurrent, mild: Secondary | ICD-10-CM

## 2019-03-15 DIAGNOSIS — F333 Major depressive disorder, recurrent, severe with psychotic symptoms: Secondary | ICD-10-CM | POA: Diagnosis not present

## 2019-03-15 NOTE — Progress Notes (Addendum)
Virtual Visit via Video Note  I connected with [name] on 03/15/19 at  9:00 AM EDT by a video enabled telemedicine application and verified that I am speaking with the correct person using two identifiers.   I discussed the limitations of evaluation and management by telemedicine and the availability of in person appointments. The patient expressed understanding and agreed to proceed.  I discussed the assessment and treatment plan with the patient. The patient was provided an opportunity to ask questions and all were answered. The patient agreed with the plan and demonstrated an understanding of the instructions.   The patient was advised to call back or seek an in-person evaluation if the symptoms worsen or if the condition fails to improve as anticipated.  I provided 180 minutes of non-face-to-face time during this encounter.   Harlon Ditty, LCSW     Daily Group Progress Note  Program: IOP  Group Time: 9am-12pm  Participation Level: Active  Behavioral Response: Appropriate  Type of Therapy:  Group Therapy; process group, psycho-educational group  Summary of Progress:  The purpose of this group is to utilize CBT and DBT skills in a group setting to increase use of healthy coping skills and decrease frequency and intensity of active mental health symptoms. 9am-10:30am Clinician checked in with group members, assessing for SI/HI/psychosis and overall level of functioning. Clinician inquired about self-care activity completed yesterday, and thoughts related to yesterday's session. Clinician presented the topic of Feelings, Thoughts, and Mind Traps. Clinician and group members reviewed traps and processed ways to challenge thoughts. 10:30am-12pm Clinician presented Roadblocks to Healthy Thinking. Clinician and group members reviewed roadblocks and discussed ways to challenge thoughts. Clinician and group members roved Developing New Thinking Habits worksheet and Tips For  Breaking the Cycle, including the STOPP skill. Client identified some mind traps she currently or previously used. Client identified others use of distorted thoughts and focused on how to address others with distorted thoughts. Client did show some insight in identifying that she is responsible for her own actions and not the thoughts of others, even though that is frustrating. Client reports she is making progress by being more comfortable in uncomfortable conversations. Client was more easily directed this session.  Harlon Ditty, LCSW
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
## 2019-03-16 ENCOUNTER — Other Ambulatory Visit: Payer: Self-pay

## 2019-03-16 ENCOUNTER — Other Ambulatory Visit (HOSPITAL_COMMUNITY): Payer: 59 | Admitting: Licensed Clinical Social Worker

## 2019-03-16 DIAGNOSIS — F319 Bipolar disorder, unspecified: Secondary | ICD-10-CM

## 2019-03-16 DIAGNOSIS — F333 Major depressive disorder, recurrent, severe with psychotic symptoms: Secondary | ICD-10-CM | POA: Diagnosis not present

## 2019-03-17 ENCOUNTER — Encounter (HOSPITAL_COMMUNITY): Payer: Self-pay | Admitting: Psychiatry

## 2019-03-17 ENCOUNTER — Other Ambulatory Visit: Payer: Self-pay

## 2019-03-17 ENCOUNTER — Other Ambulatory Visit (HOSPITAL_COMMUNITY): Payer: 59 | Admitting: Psychiatry

## 2019-03-17 DIAGNOSIS — F411 Generalized anxiety disorder: Secondary | ICD-10-CM

## 2019-03-17 DIAGNOSIS — F4310 Post-traumatic stress disorder, unspecified: Secondary | ICD-10-CM

## 2019-03-17 DIAGNOSIS — F319 Bipolar disorder, unspecified: Secondary | ICD-10-CM

## 2019-03-17 DIAGNOSIS — F330 Major depressive disorder, recurrent, mild: Secondary | ICD-10-CM

## 2019-03-17 DIAGNOSIS — F333 Major depressive disorder, recurrent, severe with psychotic symptoms: Secondary | ICD-10-CM | POA: Diagnosis not present

## 2019-03-17 NOTE — Progress Notes (Signed)
  Virtual Visit via Video Note  I connected with Sandra Herring on 03/16/2019 at  9:00 AM EDT by a video enabled telemedicine application and verified that I am speaking with the correct person using two identifiers.   I discussed the limitations of evaluation and management by telemedicine and the availability of in person appointments. The patient expressed understanding and agreed to proceed.   I discussed the assessment and treatment plan with the patient. The patient was provided an opportunity to ask questions and all were answered. The patient agreed with the plan and demonstrated an understanding of the instructions.   The patient was advised to call back or seek an in-person evaluation if the symptoms worsen or if the condition fails to improve as anticipated.  I provided 180 minutes of non-face-to-face time during this encounter.   Harlon Ditty, LCSW    Daily Group Progress Note  Program: IOP  Group Time: 9am-12pm  Participation Level: Active  Behavioral Response: Appropriate  Type of Therapy:  Group Therapy; process group, psycho-educational group  Summary of Progress:  The purpose of this group is to utilize CBT and DBT skills in a group setting to increase use of healthy coping skills and decrease frequency and intensity of active mental health symptoms.  9am-10:30am Clinician checked in with clients, assessing for SI/HI/psychosis and overall level of functioning. Process group co-facilitated by Candelaria Stagers, with a focus on loss and grieving losses. Clients identified loss of sense of self as a concern and a goal for therapy.  10:30am-11am Clinician reviewed discharge planning workbook, encouraging clients to review and follow up with therapist or case manager with questions.  11am-12pm Psycho-educational group co-facilitated by staff from [facility], with focus on holistic changes which support mental health growth and sustainment. Topics included sleep  hygiene, nutrition, exercise, and self care. Client identified loss of sense of self and reports she possibly did not identify a personal sense of self which did not involve some co-dependency starting from a young age.  Harlon Ditty, LCSW

## 2019-03-17 NOTE — Progress Notes (Signed)
Virtual Visit via Video Note  I connected with Sandra Herring on 03/17/19 at  9:00 AM EDT by a video enabled telemedicine application and verified that I am speaking with the correct person using two identifiers.  Location: Patient: Sandra Herring Provider: Hilbert Odor, LCSW   I discussed the limitations of evaluation and management by telemedicine and the availability of in person appointments. The patient expressed understanding and agreed to proceed.  History of Present Illness: MDD/GAD/PTSD   Observations/Objective: Sandra Herring was an active participant in group today, despite reporting that she was having negative side effects from medications, including dizziness and vision issues. From 9-10:30 we had a guest speaker from the Pharmacy provide psychoeducation on medications. Sandra Herring asked questions and shared concerns with her current medications and medical conditions. She reported new insights on how to wane off her longterm psych medications.  From 10:30-12 the Counselor engaged participants in the "Body Scan" activity and developed lists of questions and concerns to share with doctors at their next appointments regarding their symptoms. Counselor provided psychoeducation on how Trauma Impacts the Brain and provided materials to the participants. Counselor closed group by feedback on takeaways from informaiton covered and shared and with all identifying self-care plans for the day. Sandra Herring denied any SI/HI or safety concerns.   Assessment and Plan:  Sandra Herring will implement skills learned in group, will take medications as prescribed, follow crisis/safety plan and will return tomorrow to group.   Follow Up Instructions: Counselor will send Webex link for tomorrow's session.    I discussed the assessment and treatment plan with the patient. The patient was provided an opportunity to ask questions and all were answered. The patient agreed with the plan and demonstrated an understanding of  the instructions.   The patient was advised to call back or seek an in-person evaluation if the symptoms worsen or if the condition fails to improve as anticipated.  I provided 180 minutes of non-face-to-face time during this encounter.   Hilbert Odor, LCSW

## 2019-03-18 ENCOUNTER — Other Ambulatory Visit (HOSPITAL_COMMUNITY): Payer: 59 | Admitting: Psychiatry

## 2019-03-18 ENCOUNTER — Other Ambulatory Visit: Payer: Self-pay

## 2019-03-20 ENCOUNTER — Encounter (HOSPITAL_COMMUNITY): Payer: Self-pay | Admitting: Psychiatry

## 2019-03-20 NOTE — Progress Notes (Signed)
Virtual Visit via Video Note  I connected with Sandra Herring on 03/20/19 at  9:00 AM EDT by a video enabled telemedicine application and verified that I am speaking with the correct person using two identifiers.  Location: Patient: Sandra Herring Provider: Hilbert Odor, LCSW   I discussed the limitations of evaluation and management by telemedicine and the availability of in person appointments. The patient expressed understanding and agreed to proceed.  History of Present Illness: Bipolar 1 disorder, MDD, GAD, and PTSD.   Observations/Objective: Sandra Herring participated well and was engaged with group members and Counselor during group. Sandra Herring shared feedback on her takeaways from group yesterday and was particularly interested in doing more grief and loss work. Counselor provided psychoeducation on the ACEStudy to better understand how childhood traumas are connected with adult medical conditions. Sandra Herring took the questionnaire, shared her results and reflected on her childhood. Sandra Herring experienced distress during the activity and distaste for the information because she stated "I'm tired of being told how shitty my life is because my childhood, I want solutions to the problems it caused." Counselor provided empathy and promised to focus on solutions. Counselor then shared a video on [employer] of Resilience and prompted group members to take the Resilience questionnaire. Counselor processed results with the group members. Counselor then transitioned the group to Chair Assisted Yoga with Sandra Herring, Counselor and Yoga instructor. Sandra Herring participated in the the guided Yoga activities and provided thoughtful feedback, reporting positive benefits at the end of the session. Counselor assessed self-care ideas with the group in closing.   Assessment and Plan: Counselor recommends Doren continue in IOP treatment to address mental health symptoms and to build coping skills.   Follow Up  Instructions: Counselor will send out Webex link for tomorrows session.     I discussed the assessment and treatment plan with the patient. The patient was provided an opportunity to ask questions and all were answered. The patient agreed with the plan and demonstrated an understanding of the instructions.   The patient was advised to call back or seek an in-person evaluation if the symptoms worsen or if the condition fails to improve as anticipated.  I provided 180 minutes of non-face-to-face time during this encounter.   Hilbert Odor, LCSW

## 2019-03-21 NOTE — Psych (Signed)
Virtual Visit via Video Note  I connected with Sandra Herring on 03/02/19 at  9:00 AM EDT by a video enabled telemedicine application and verified that I am speaking with the correct person using two identifiers.   I discussed the limitations of evaluation and management by telemedicine and the availability of in person appointments. The patient expressed understanding and agreed to proceed.   I discussed the assessment and treatment plan with the patient. The patient was provided an opportunity to ask questions and all were answered. The patient agreed with the plan and demonstrated an understanding of the instructions.   The patient was advised to call back or seek an in-person evaluation if the symptoms worsen or if the condition fails to improve as anticipated.  Pt was provided 240 minutes of non-face-to-face time during this encounter.   Donia GuilesJenny Juliani Laduke, LCSW      [facility] THER[facility]  Sandra Herring [id number]  Session Time: 9:00 - 10:00  Participation Level: Active  Behavioral Response: CasualAlertDepressed  Type of Therapy: Group Therapy  Treatment Goals addressed: Coping  Interventions: CBT, DBT, Supportive and Reframing  Summary: Clinician led check-in regarding current stressors and situation, and review of patient completed daily inventory. Clinician utilized active listening and empathetic response and validated patient emotions. Clinician facilitated processing group on pertinent issues.   Therapist Response: Sandra CockayneLaurie A Jolley is a 58 y.o. female who presents with depression, anxiety, and  trauma symptoms. Patient arrived within time allowed and reports that she is feeling "pretty good." Patient rates her mood at a 8 on a scale of 1-10 with 10 being great. Pt reports she has been thinking more and more about work and decided that she would "rather sell everything I own than go back to that job." When cln reflected back that pt is sure she does  not want to go back to her job, pt reacted and states that now she is in a "panic" due to the question. Pt does not say much about the topic after this point. Patient engaged in discussion.     Session Time: 10:00 -11:00  Participation Level: Active  Behavioral Response: CasualAlertDepressed  Type of Therapy: Group Therapy, psychoeducation, psychotherapy  Treatment Goals addressed: Coping  Interventions: CBT, DBT, Solution Focused, Supportive and Reframing  Summary: Cln led discussion on absolute thinking and the ways in which it can upset our perception.  Therapist Response: Pt is emphatic about the problematic aspect of rigid thinking, however her examples are full of rigid thinking and pt is confused when cln highlights these examples. Pt struggles with intellectualization and being challenged.    Session Time: 11:00 -12:00  Participation Level:Active  Behavioral Response:CasualAlertDepressed  Type of Therapy: Group Therapy, psychotherapy  Treatment Goals addressed: Coping  Interventions:Strengths based, reframing, Supportive,   Summary:Spiritual Care group  Therapist Response: Patient engaged in group. See chaplain note.       Session Time: 12:00- 1:00  Participation Level:Active  Behavioral Response:CasualAlertDepressed  Type of Therapy: Group Therapy, Psychoeducation  Treatment Goals addressed: Coping  Interventions:relaxation training; Supportive; Reframing  Summary:12:00 - 12:50: Relaxation group: Cln led group focused on retraining the body's response to stress.  12:50 -1:00 Clinician led check-out. Clinician assessed for immediate needs, medication compliance and efficacy, and safety concerns  Therapist Response: Patient engaged in activity and discussion. At check-out, patient rates hermood at a 7on a scale of 1-10 with 10 being great. Patient reports she does not have concrete afternoon plans and that she will  research more into [employer].Patient demonstrates some progress as evidenced by moving closer to a decision for what she wants next in her life. Patient denies SI/HI/self-harm thoughts at the end of group.   Suicidal/Homicidal: Nowithout intent/plan   Plan: Pt will continue in PHP while working to decrease symptoms and increase ability to manage symptoms in a healthy manner.    Diagnosis: MDD (major depressive disorder), recurrent, severe, with psychosis (HCC) [F33.3]    1. MDD (major depressive disorder), recurrent, severe, with psychosis (HCC)   2. GAD (generalized anxiety disorder)   3. PTSD (post-traumatic stress disorder)       [name], LCSW 03/21/2019

## 2019-03-21 NOTE — Psych (Signed)
Virtual Visit via Video Note  I connected with Sandra Herring on 03/03/19 at  9:00 AM EDT by a video enabled telemedicine application and verified that I am speaking with the correct person using two identifiers.   I discussed the limitations of evaluation and management by telemedicine and the availability of in person appointments. The patient expressed understanding and agreed to proceed.   I discussed the assessment and treatment plan with the patient. The patient was provided an opportunity to ask questions and all were answered. The patient agreed with the plan and demonstrated an understanding of the instructions.   The patient was advised to call back or seek an in-person evaluation if the symptoms worsen or if the condition fails to improve as anticipated.  Pt was provided 240 minutes of non-face-to-face time during this encounter.   [name], LCSW      [facility] BH PHP THERAPIST PROGRESS NOTE  Sandra Herring [id number]  Session Time: 9:00 - 10:00  Participation Level: Active  Behavioral Response: CasualAlertDepressed  Type of Therapy: Group Therapy  Treatment Goals addressed: Coping  Interventions: CBT, DBT, Supportive and Reframing  Summary: Clinician led check-in regarding current stressors and situation, and review of patient completed daily inventory. Clinician utilized active listening and empathetic response and validated patient emotions. Clinician facilitated processing group on pertinent issues.   Therapist Response: KOREEN MEZEY is a 58 y.o. female who presents with depression, anxiety, and  trauma symptoms. Patient arrived within time allowed and reports that she is feeling "heartbroken." Patient rates her mood at a 3 on a scale of 1-10 with 10 being great. Pt reports struggling with this being her last day in group. Pt states that "this feels like my family" and being in group is "a healing process for me." Pt reports having "brain fog" about  yesterday and is not sure what she did. Pt states this has been an issue for her for approximately 4 years and agrees to meet with her PCP and discuss the issue to rule out organic concerns. Pt is tearful throughout session.  Pt able to process. Patient engaged in discussion.     Session Time: 10:00 -11:00  Participation Level: Active  Behavioral Response: CasualAlertDepressed  Type of Therapy: Group Therapy, psychoeducation, psychotherapy  Treatment Goals addressed: Coping  Interventions: CBT, DBT, Solution Focused, Supportive and Reframing  Summary: Cln led discussion on authenticity and how we hide or show ourselves and the affects of both. Group discussed how they interact with these concepts and how it plays out in their lives.   Therapist Response: Pt reports "I can't not be me. I am authentic to my core." Pt states this has caused some issues for her as people do not always like who she is. Pt continues to struggle with understanding that being genuine and being tactful do not have to be opposites.      Session Time: 11:00 - 12:00   Participation Level: Active   Behavioral Response: CasualAlertDepressed   Type of Therapy: Group Therapy, OT   Treatment Goals addressed: Coping   Interventions: Psychosocial skills training, Supportive,    Summary:  Occupational Therapy group   Therapist Response: Patient engaged in group. See OT note.        Session Time: 12:00- 1:00  Participation Level: Active  Behavioral Response: CasualAlertDepressed  Type of Therapy: Group Therapy, Psychoeducation; Psychotherapy  Treatment Goals addressed: Coping  Interventions: CBT; Solution focused; Supportive; Reframing  Summary: 12:00 - 12:50 Cln introduced topic of  feelings. Cln educated pt's on the difference between feelings and the reaction to our feelings. Cln provided framework of which to view feelings and how meaning is given not innate.  12:50 -1:00 Clinician led  check-out. Clinician assessed for immediate needs, medication compliance and efficacy, and safety concerns   Therapist Response: Pt struggles with not giving feelings innate meaning and states that she is an "empath" and that she knows feelings all have meanings because she relates to their energy.   At checkout, pt rates her mood at a 4 on a scale of 1-10 with 10 being great. Patient reports afternoon plans of doing paperwork and communicating with maintenance to get her shower fixed. Patient demonstrates some progress as evidenced by making future oriented decisions. Patient denies SI/HI/self-harm at the end of group.    Suicidal/Homicidal: Nowithout intent/plan   Plan: Pt will discharge from PHP due to meeting treatment goals of decreased symptoms and increased ability to manage symptoms in a healthy manner. Pt will step-down to IOP within this agency to continue to work on stability and mood lability. Pt will begin 03/07/19.  Pt and provider are aligned with discharge plan. Pt denies SI/HI/psychosis at time of discharge.    Diagnosis: MDD (major depressive disorder), recurrent, severe, with psychosis (HCC) [F33.3]    1. MDD (major depressive disorder), recurrent, severe, with psychosis (HCC)   2. GAD (generalized anxiety disorder)   3. PTSD (post-traumatic stress disorder)       Donia GuilesJenny Verlia Kaney, LCSW 03/21/2019

## 2019-03-22 ENCOUNTER — Other Ambulatory Visit (HOSPITAL_COMMUNITY): Payer: 59 | Admitting: Licensed Clinical Social Worker

## 2019-03-22 ENCOUNTER — Other Ambulatory Visit: Payer: Self-pay

## 2019-03-22 DIAGNOSIS — F333 Major depressive disorder, recurrent, severe with psychotic symptoms: Secondary | ICD-10-CM | POA: Diagnosis not present

## 2019-03-22 DIAGNOSIS — F319 Bipolar disorder, unspecified: Secondary | ICD-10-CM

## 2019-03-23 ENCOUNTER — Other Ambulatory Visit (HOSPITAL_COMMUNITY): Payer: 59 | Admitting: Psychiatry

## 2019-03-23 ENCOUNTER — Encounter (HOSPITAL_COMMUNITY): Payer: Self-pay

## 2019-03-23 ENCOUNTER — Other Ambulatory Visit: Payer: Self-pay

## 2019-03-23 DIAGNOSIS — F319 Bipolar disorder, unspecified: Secondary | ICD-10-CM

## 2019-03-23 DIAGNOSIS — F4310 Post-traumatic stress disorder, unspecified: Secondary | ICD-10-CM

## 2019-03-23 DIAGNOSIS — F330 Major depressive disorder, recurrent, mild: Secondary | ICD-10-CM

## 2019-03-23 DIAGNOSIS — F333 Major depressive disorder, recurrent, severe with psychotic symptoms: Secondary | ICD-10-CM | POA: Diagnosis not present

## 2019-03-23 DIAGNOSIS — F411 Generalized anxiety disorder: Secondary | ICD-10-CM

## 2019-03-23 NOTE — Progress Notes (Signed)
Virtual Visit via Video Note  I connected with Sandra Herring on 03/23/19 at  9:00 AM EDT by a video enabled telemedicine application and verified that I am speaking with the correct person using two identifiers.  Location: Patient: Sandra Herring Provider: Hilbert Odor, LCSW   I discussed the limitations of evaluation and management by telemedicine and the availability of in person appointments. The patient expressed understanding and agreed to proceed.  History of Present Illness: Bipolar I disorder, MDD, GAD and PTSD due to adverse life experiences.    Observations/Objective: Counselor checked in with Sandra Herring and group members to gauge their mood and functioning to start the session. Counselor reviewed the TIPP Skills DBT Worksheet and processed history of panic attacks with group members. Sandra Herring asked questions for clarification. Counselor introduced Sandra Herring, [place] to process grief and loss issues with the group members. Sandra Herring volunteered to share and was able to connect with the emotions triggered by the losses in her life, primarily the feeling of fear. Counselor thanked Clinical biochemist for his work with the group, then we spent time reflecting on lessons learned from the processing. Counselor prompted the group to have a time to journal and self-reflection on moving forward and accepting losses. Counselor gathered reflective responses from group members. Sandra Herring had profound reflections and an action points to grow more in this area. Counselor then transitioned to sharing psychoeducation on Self-Compassion, which Sandra Herring has references wanting to know more about in past sessions. Sandra Herring shared additional thoughts and resources with the group.  Counselor summarized today's session and prompted each participant to share one self-care or productive task they would accomplish today.   Assessment and Plan: Sandra Herring participated well in group today. She continues to be invested in  treatment and improving her mental health. Counselor recommends that she continues participation in group to address mental health needs and treatment plan goals.   Follow Up Instructions: Counselor will send Webex link for next group meeting.     I discussed the assessment and treatment plan with the patient. The patient was provided an opportunity to ask questions and all were answered. The patient agreed with the plan and demonstrated an understanding of the instructions.   The patient was advised to call back or seek an in-person evaluation if the symptoms worsen or if the condition fails to improve as anticipated.  I provided 180 minutes of non-face-to-face time during this encounter.   Hilbert Odor, LCSW

## 2019-03-24 ENCOUNTER — Other Ambulatory Visit: Payer: Self-pay

## 2019-03-24 ENCOUNTER — Other Ambulatory Visit (HOSPITAL_COMMUNITY): Payer: 59 | Admitting: Psychiatry

## 2019-03-24 ENCOUNTER — Encounter (HOSPITAL_COMMUNITY): Payer: Self-pay | Admitting: Psychiatry

## 2019-03-24 DIAGNOSIS — F411 Generalized anxiety disorder: Secondary | ICD-10-CM

## 2019-03-24 DIAGNOSIS — F319 Bipolar disorder, unspecified: Secondary | ICD-10-CM

## 2019-03-24 DIAGNOSIS — F4310 Post-traumatic stress disorder, unspecified: Secondary | ICD-10-CM

## 2019-03-24 DIAGNOSIS — F333 Major depressive disorder, recurrent, severe with psychotic symptoms: Secondary | ICD-10-CM | POA: Diagnosis not present

## 2019-03-24 DIAGNOSIS — F330 Major depressive disorder, recurrent, mild: Secondary | ICD-10-CM

## 2019-03-24 NOTE — Progress Notes (Signed)
Virtual Visit via Video Note  I connected with Sandra Herring on 03/22/2019 at  9:00 AM EDT by a video enabled telemedicine application and verified that I am speaking with the correct person using two identifiers.   I discussed the limitations of evaluation and management by telemedicine and the availability of in person appointments. The patient expressed understanding and agreed to proceed.  I discussed the assessment and treatment plan with the patient. The patient was provided an opportunity to ask questions and all were answered. The patient agreed with the plan and demonstrated an understanding of the instructions.   The patient was advised to call back or seek an in-person evaluation if the symptoms worsen or if the condition fails to improve as anticipated.  I provided 180 minutes of non-face-to-face time during this encounter.   Harlon Ditty, LCSW     Daily Group Progress Note  Program: IOP  Group Time: 9am-12pm  Participation Level: Active  Behavioral Response: Monopolizing and Motivated  Type of Therapy:  Group Therapy; process group, psycho-educational group  Summary of Progress:  The purpose of this group is to utilize CBT in a group setting to increase use of healthy coping skills and decrease frequency and intensity of active mental health symptoms.  9am-10:30am Clinician checked in with group members, assessing for SI/HI/psychosis and overall level of functioning. Clinician and group members processed stressors from the long weekend and any skills practiced to address uncomfortable feelings or situations. Clinician reviewed CBT triangle with clients and the connection of thoughts, feelings, and behaviors. Clinician and group members practiced in session identifying feelings and thoughts and creating alternative thoughts to modify the feelings and behaviors. Clinician praised client attempts. 10:30am-12pm Clinician lead  grounding activity of 5-7-9 breathing  to help manage physical symptoms of anxiety and focus on staying in the moment. Clinician and group members reviewed types of coping skills, when they could be effective, and how to address stressors long-term. Clinician checked out with requesting group members identify a self-care activity to complete before the following group. Client was able to restructure thoughts utilizing CBT triangle with prompting. Client became upset and required prompting of use of grounding skill to calm down. Client identified that identifying cognitive distortions is something she would have to be mindful of practicing.  Harlon Ditty, LCSW

## 2019-03-24 NOTE — Progress Notes (Signed)
Virtual Visit via Video Note  I connected with Sandra Herring on 03/24/19 at  9:00 AM EDT by a video enabled telemedicine application and verified that I am speaking with the correct person using two identifiers.  Location: Patient: Sandra Herring Provider: Hilbert Odor, LCSW   I discussed the limitations of evaluation and management by telemedicine and the availability of in person appointments. The patient expressed understanding and agreed to proceed.  History of Present Illness: Bipolar I disorder, MDD, GAD, and PTSD due to adverse life experiences and work related issues.    Observations/Objective: Counselor allowed group members to share check-ins on how they are feeling coming into group. Sandra Herring reported that she was in a good head space today and shared additional information including a success story about her son's personal recover. Counselor reengaged the group in the article on self-compassion. Counselor reviewed concepts then walked the group through 3 self-compassion exercises. Sandra Herring participated well and shared thoughtful and reflective feedback about her experiences in the activities. She discussed ways in which she planned to implement the skills in her daily life. Counselor shared a video summarizing Self-compassion concepts, however Sandra Herring had to excuse herself briefly to attend to her pet, so she missed the video. Upon her return, the Counselor prompted each group member to share what self-care practices they would implement between now and next session. Sandra Herring was excited to she that she believed she had made a positive connection with a person who can help her channel her creative and intellectual powers into something positive and productive.   Assessment and Plan: Counselor recommends continuation of IOP services for Sandra Herring in addressing her mental health symptoms and treatment plan goals. Sandra Herring plans to attend tomorrows session.   Follow Up Instructions: Counselor  will send Webex link for tomorrows session.    I discussed the assessment and treatment plan with the patient. The patient was provided an opportunity to ask questions and all were answered. The patient agreed with the plan and demonstrated an understanding of the instructions.   The patient was advised to call back or seek an in-person evaluation if the symptoms worsen or if the condition fails to improve as anticipated.  I provided 170 minutes of non-face-to-face time during this encounter.   Hilbert Odor, LCSW

## 2019-03-25 ENCOUNTER — Telehealth (HOSPITAL_COMMUNITY): Payer: Self-pay | Admitting: Psychiatry

## 2019-03-25 ENCOUNTER — Other Ambulatory Visit: Payer: Self-pay

## 2019-03-25 ENCOUNTER — Other Ambulatory Visit (HOSPITAL_COMMUNITY): Payer: 59 | Admitting: Psychiatry

## 2019-03-28 ENCOUNTER — Other Ambulatory Visit: Payer: Self-pay

## 2019-03-28 ENCOUNTER — Other Ambulatory Visit (HOSPITAL_COMMUNITY): Payer: 59 | Attending: Psychiatry | Admitting: Psychiatry

## 2019-03-28 ENCOUNTER — Telehealth (HOSPITAL_COMMUNITY): Payer: Self-pay | Admitting: Psychiatry

## 2019-03-28 DIAGNOSIS — F319 Bipolar disorder, unspecified: Secondary | ICD-10-CM

## 2019-03-28 DIAGNOSIS — F411 Generalized anxiety disorder: Secondary | ICD-10-CM

## 2019-03-28 DIAGNOSIS — F330 Major depressive disorder, recurrent, mild: Secondary | ICD-10-CM

## 2019-03-28 DIAGNOSIS — F4310 Post-traumatic stress disorder, unspecified: Secondary | ICD-10-CM

## 2019-03-28 DIAGNOSIS — F329 Major depressive disorder, single episode, unspecified: Secondary | ICD-10-CM | POA: Insufficient documentation

## 2019-03-28 DIAGNOSIS — F419 Anxiety disorder, unspecified: Secondary | ICD-10-CM | POA: Insufficient documentation

## 2019-03-28 DIAGNOSIS — Z79899 Other long term (current) drug therapy: Secondary | ICD-10-CM | POA: Insufficient documentation

## 2019-03-28 NOTE — Progress Notes (Signed)
Virtual Visit via Video Note  I connected with [name] on 03/28/19 at  9:00 AM EDT by a video enabled telemedicine application and verified that I am speaking with the correct person using two identifiers.  Location: Patient: Sandra Herring Provider: Hilbert Odor, LCSW   I discussed the limitations of evaluation and management by telemedicine and the availability of in person appointments. The patient expressed understanding and agreed to proceed.  History of Present Illness: Bipolar I disorder, GAD, MDD and PTSD due to adverse life experiences.    Observations/Objective: Counselor checked in with all participants and introduced new participants. Usha reported that she experienced migraine headaches 3 out of the past 4 days. Counselor reminded her to take care of basic needs to address the pain and to let Counselor know if she needed to step out.  Counselor reviewed learned coping skills during treatment, prompting participants to share their favorites and most helpful. Darsey was able to identify several and how she implements them, as well as how she is sharing them with family members.  Counselor then shared a video on 25 additional coping skills, prompting the group to jot them down and share which they are interested in trying next time they are feeling overwhelmed with thoughts or feelings. Rossalyn shared her preferences and plan to use them, specifically the letter witting to her past, present and future self. Counselor then engaged participants in an ECOMAP activity, prompting them to create their own, then share about current support systems/dynamics. Epifania identified she has become distant with many people and finds it hard connecting with coworkers. She identified a close relationship with son, sister and dog. Counselor praised all for their participation in group and prompted each to share their self-care or productivity activity for the day. Chandi planed to rest her eyes  and to eat for self-care.    Assessment and Plan: Andalasia continues to grow and develop understanding of her mental illness. Counselor recommends that Verina remain in IOP Group to continue work on treatment plan goals to better manage mental health symptoms.   Follow Up Instructions: Counselor will send Webex link for the next session.    I discussed the assessment and treatment plan with the patient. The patient was provided an opportunity to ask questions and all were answered. The patient agreed with the plan and demonstrated an understanding of the instructions.   The patient was advised to call back or seek an in-person evaluation if the symptoms worsen or if the condition fails to improve as anticipated.  I provided 150 minutes of non-face-to-face time during this encounter.   Hilbert Odor, LCSW

## 2019-03-29 ENCOUNTER — Other Ambulatory Visit (HOSPITAL_COMMUNITY): Payer: 59

## 2019-03-29 ENCOUNTER — Other Ambulatory Visit: Payer: Self-pay

## 2019-03-29 ENCOUNTER — Ambulatory Visit (INDEPENDENT_AMBULATORY_CARE_PROVIDER_SITE_OTHER): Payer: 59 | Admitting: Psychology

## 2019-03-29 DIAGNOSIS — F909 Attention-deficit hyperactivity disorder, unspecified type: Secondary | ICD-10-CM

## 2019-03-29 DIAGNOSIS — F339 Major depressive disorder, recurrent, unspecified: Secondary | ICD-10-CM | POA: Diagnosis not present

## 2019-03-29 DIAGNOSIS — F4312 Post-traumatic stress disorder, chronic: Secondary | ICD-10-CM

## 2019-03-30 ENCOUNTER — Other Ambulatory Visit (HOSPITAL_COMMUNITY): Payer: 59 | Admitting: Psychiatry

## 2019-03-30 ENCOUNTER — Other Ambulatory Visit: Payer: Self-pay

## 2019-03-30 DIAGNOSIS — F419 Anxiety disorder, unspecified: Secondary | ICD-10-CM | POA: Diagnosis not present

## 2019-03-30 DIAGNOSIS — F333 Major depressive disorder, recurrent, severe with psychotic symptoms: Secondary | ICD-10-CM | POA: Diagnosis present

## 2019-03-30 DIAGNOSIS — Z79899 Other long term (current) drug therapy: Secondary | ICD-10-CM | POA: Diagnosis not present

## 2019-03-30 DIAGNOSIS — F411 Generalized anxiety disorder: Secondary | ICD-10-CM

## 2019-03-30 DIAGNOSIS — F4310 Post-traumatic stress disorder, unspecified: Secondary | ICD-10-CM | POA: Diagnosis not present

## 2019-03-30 DIAGNOSIS — F330 Major depressive disorder, recurrent, mild: Secondary | ICD-10-CM

## 2019-03-30 DIAGNOSIS — F319 Bipolar disorder, unspecified: Secondary | ICD-10-CM

## 2019-03-30 DIAGNOSIS — F329 Major depressive disorder, single episode, unspecified: Secondary | ICD-10-CM | POA: Diagnosis not present

## 2019-03-31 ENCOUNTER — Encounter (HOSPITAL_COMMUNITY): Payer: Self-pay

## 2019-03-31 ENCOUNTER — Other Ambulatory Visit: Payer: Self-pay

## 2019-03-31 ENCOUNTER — Other Ambulatory Visit (HOSPITAL_COMMUNITY): Payer: 59 | Admitting: Psychiatry

## 2019-03-31 NOTE — Patient Instructions (Signed)
D:  Patient will be discharging from [facility] tomorrow (04-01-19).  A:  Discharge on 04-01-19.  Follow up with Dr. Lolly Mustache on 04-06-19 @ 11 a.m; Hilbert Odor, LCSW on 04-08-19 @ 9:30 a.m.  Encouraged support groups/ [facility] [phone]). R:  Pt receptive.

## 2019-03-31 NOTE — Progress Notes (Signed)
Virtual Visit via Video Note  I connected with Sandra Herring on 03/31/19 at  9:00 AM EDT by a video enabled telemedicine application and verified that I am speaking with the correct person using two identifiers.  Location: Patient: Sandra Herring Provider: Hilbert Odor, LCSW   I discussed the limitations of evaluation and management by telemedicine and the availability of in person appointments. The patient expressed understanding and agreed to proceed.  History of Present Illness: Bipolar disorder, MDD, GAD, and PTSD due to adverse life experiences and work-related issues.    Observations/Objective: Sandra Herring participated well and was engaged in giving feedback and in the therapy activities in group today. Counselor checked in with all group members to assess current mood and functioning. Sandra Herring shared that she exercised yesterday and this morning, andhad plenty of rest/sleep last night. However, she continues to have a migraine. Counselor engaged the group in psychoeducation and skill development on grounding techniques. Sandra Herring shared when called on and offered ways she intended to utilize these skills in everyday situations. She particularly liked the thinking techniques using numbers and lists. Counselor shared a video on Setting Boundaries and processed the concepts with group members. Sandra Herring shared about work relationships and how she she continues to struggle in this area. Counselor closed with checking in with all about their self-care plan and productivity task for the day.   Assessment and Plan: Counselor recommends that Sandra Herring remains in IOP treatment to continue work on treatment plan goals and better management of symptoms. She should continue taking medications as prescribed, follow safety and crisis plan and implement skills used in session.   Follow Up Instructions: Counselor will send out Webex Link to connect to IOP for tomorrow.    I discussed the assessment and  treatment plan with the patient. The patient was provided an opportunity to ask questions and all were answered. The patient agreed with the plan and demonstrated an understanding of the instructions.   The patient was advised to call back or seek an in-person evaluation if the symptoms worsen or if the condition fails to improve as anticipated.  I provided 180 minutes of non-face-to-face time during this encounter.   Hilbert Odor, LCSW

## 2019-04-01 ENCOUNTER — Other Ambulatory Visit (HOSPITAL_BASED_OUTPATIENT_CLINIC_OR_DEPARTMENT_OTHER): Payer: 59 | Admitting: Licensed Clinical Social Worker

## 2019-04-01 ENCOUNTER — Encounter (HOSPITAL_COMMUNITY): Payer: Self-pay | Admitting: Family

## 2019-04-01 ENCOUNTER — Other Ambulatory Visit: Payer: Self-pay

## 2019-04-01 DIAGNOSIS — F411 Generalized anxiety disorder: Secondary | ICD-10-CM | POA: Diagnosis not present

## 2019-04-01 DIAGNOSIS — F329 Major depressive disorder, single episode, unspecified: Secondary | ICD-10-CM | POA: Diagnosis not present

## 2019-04-01 DIAGNOSIS — F319 Bipolar disorder, unspecified: Secondary | ICD-10-CM

## 2019-04-01 NOTE — Progress Notes (Signed)
Virtual Visit via Video Note  I connected with Sandra Herring on 04/01/19 at 0800 by a video enabled telemedicine application and verified that I am speaking with the correct person using two identifiers.  I discussed the limitations of evaluation and management by telemedicine and the availability of in person appointments. The patient expressed understanding and agreed to proceed. I discussed the assessment and treatment plan with the patient. The patient was provided an opportunity to ask questions and all were answered. The patient agreed with the plan and demonstrated an understanding of the instructions.   The patient was advised to call back or seek an in-person evaluation if the symptoms worsen or if the condition fails to improve as anticipated.  I provided 20 minutes of non-face-to-face time during this encounter.     This is a 58 yr old, divorced, employed, Caucasian female who was transitioned from [facility].  Pt was admitted in PHP from 02-14-19 thru 03-03-19; treatment for ongoing emotional distress.  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  She has been a dialysis nurse since 1987,  Apparently, there has been ongoing conflict with coworkers and management.  "it's a very abusive and stressful environment."  According to pt, she has a formal complaint from a pt against her ("for praying for her.")  Pt has had two prior psych admissions (2006 and 2016).  Has been seeing Dr. Lolly Mustache for 9 months and Jerral Bonito, [facility] off and on since March 2019.  Denies any prior suicide attempts or gestures.  Family hx:  Mother and Father (Depression); Sister and Brother (DID).   Pt completed all scheduled days.  Mood is overall improving.  Pt still has tangential thinking.  Pt has started walking everyday.  Sister recently visited pt from [place], [place]; states she enjoyed the visit.  According to pt, she has learned a lot of skills.  "I feel empowered."  Reports she received her TMS  machine this past Tuesday.  Also, this past Tuesday had her ADHD appt with Dr. Jason Fila.  States it went well.  On 04-12-19 the actual testing will be done.  Denies SI/HI or A/V hallucinations. Pt very anxious about returning to job.  RTW on 04-12-19; unless it is extended.  A:  D/C today.  F/U with Dr. Lolly Mustache on 04-06-19 @ 11 a.m and Hilbert Odor, LCSW on 04-08-19 @ 9:30 a.m.  Encouraged groups at [facility].  R:  Pt receptive.                                                                                              Chestine Spore, RITA, M.Ed, CNA

## 2019-04-01 NOTE — Progress Notes (Signed)
Virtual Visit via Telephone Note  I connected with [name] on 04/01/19 at  9:00 AM EDT by telephone and verified that I am speaking with the correct person using two identifiers.   I discussed the limitations, risks, security and privacy concerns of performing an evaluation and management service by telephone and the availability of in person appointments. I also discussed with the patient that there may be a patient responsible charge related to this service. The patient expressed understanding and agreed to proceed.    I discussed the assessment and treatment plan with the patient. The patient was provided an opportunity to ask questions and all were answered. The patient agreed with the plan and demonstrated an understanding of the instructions.   The patient was advised to call back or seek an in-person evaluation if the symptoms worsen or if the condition fails to improve as anticipated.  I provided 15 minutes of non-face-to-face time during this encounter.  [name], NP  [facility] Intensive Outpatient Program Discharge Summary  AALEEYAH CROSLIN [id number]  Admission date: 03/02/2019 Discharge date: 04/01/2019   Reason for admission: Per admission assessment note:-Maxene Marinelli74 year old Caucasian female presents with worsening anxiety and depression related to work stressors. Patient reports she was on her final warning due to praying andoverstepping her boundarieswith patient while at work. 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 States she is currently followed by psychiatry as [facility] where she is prescribed Zoloft and recently started on Lamictal for mood stabilization. Patient reports a history of depression, anxiety, PTSD. Kimore to transitions from Partial  Hospitalization to Intensive Outpatient programimig.   Chemical Use History:  Denied   Family of Origin Issues: Patient reported  She is "rebuilding" relationships between her and her children . States he is hopeful that her son will continue to progress in the right direction after is rehabilitation. Reported she continues to reach out to her daughter who live in state. Reported constance communication with oldest daughter in [name].   Progress in Program Toward Treatment Goals: Ongoing, Chanty has attended and participated with daily group sessions. Stated learning new coping skill and "self reflection". Patient reported she feels like she is in a constant fog. 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 During her admission patient reported medication reactions with Lamictal and stated she is not interested with restarting medications. Patient stated she has plane to follow-up with her own in home " TMS " treatment. Reported she was recently referred for ADHD assessment.   and holistic medicine.  patient reported she will continue to take her Zoloft, however is not interested in starting any other medication at this time.   Progress (rationale):  Keep follow-up with MD Thermon Leyland ( ADHD) assessment. Psychiatry Af[facility]. And Therapist Hilbert Odor LCSW   Take all medications as prescribed. Keep all follow-up appointments as scheduled.  Do not consume alcohol or use illegal drugs while on prescription medications. Report any adverse effects from your medications to your primary care provider promptly.  In the event of recurrent symptoms or worsening symptoms, call 911, a crisis hotline, or go to the nearest emergency department for evaluation.   [name], NP 04/02/2019

## 2019-04-02 ENCOUNTER — Encounter (HOSPITAL_COMMUNITY): Payer: Self-pay | Admitting: Family

## 2019-04-04 ENCOUNTER — Other Ambulatory Visit: Payer: Self-pay

## 2019-04-04 ENCOUNTER — Other Ambulatory Visit (HOSPITAL_COMMUNITY): Payer: 59 | Admitting: Psychiatry

## 2019-04-05 ENCOUNTER — Ambulatory Visit (HOSPITAL_COMMUNITY): Payer: 59

## 2019-04-06 ENCOUNTER — Other Ambulatory Visit: Payer: Self-pay

## 2019-04-06 ENCOUNTER — Ambulatory Visit (INDEPENDENT_AMBULATORY_CARE_PROVIDER_SITE_OTHER): Payer: 59 | Admitting: Psychiatry

## 2019-04-06 ENCOUNTER — Encounter (HOSPITAL_COMMUNITY): Payer: Self-pay | Admitting: Psychiatry

## 2019-04-06 DIAGNOSIS — F4310 Post-traumatic stress disorder, unspecified: Secondary | ICD-10-CM | POA: Diagnosis not present

## 2019-04-06 DIAGNOSIS — F411 Generalized anxiety disorder: Secondary | ICD-10-CM

## 2019-04-06 DIAGNOSIS — F330 Major depressive disorder, recurrent, mild: Secondary | ICD-10-CM | POA: Diagnosis not present

## 2019-04-06 DIAGNOSIS — G43709 Chronic migraine without aura, not intractable, without status migrainosus: Secondary | ICD-10-CM | POA: Diagnosis not present

## 2019-04-06 MED ORDER — TRAZODONE HCL 100 MG PO TABS: ORAL_TABLET | ORAL | 0 refills | Status: DC

## 2019-04-06 MED ORDER — SERTRALINE HCL 100 MG PO TABS: 150.0000 mg | ORAL_TABLET | Freq: Every day | ORAL | 0 refills | Status: DC

## 2019-04-06 MED ORDER — DIAZEPAM 5 MG PO TABS: 5.0000 mg | ORAL_TABLET | Freq: Every day | ORAL | 0 refills | Status: DC

## 2019-04-06 MED ORDER — PROPRANOLOL HCL 20 MG PO TABS: 20.0000 mg | ORAL_TABLET | Freq: Every day | ORAL | 0 refills | Status: DC

## 2019-04-06 NOTE — Progress Notes (Signed)
Virtual Visit via Video Note  I connected with [name] on 04/06/19 at 11:00 AM EDT by a video enabled telemedicine application and verified that I am speaking with the correct person using two identifiers.   I discussed the limitations of evaluation and management by telemedicine and the availability of in person appointments. The patient expressed understanding and agreed to proceed.  History of Present Illness: Patient was evaluated by video session.  She recently finished IOP.  There were no medicine changes in the IOP.  She remains very emotional, labile and easy to cry and tearful.  Now she is doing portable TMS device for past 1 week.  She feel that it is helping her and recently her mother also noticed that she is more happy.  However patient remains very emotional and tends to forget things.  She jumps from 1 topic to other topic.  Though she want to come off from psychotropic medication but she realized without Valium trazodone sertraline and migraine she cannot function.  She felt more sad and depressed in the evening and felt Zoloft is wearing off and decided to take in the night but she started to have more restlessness in the night.  She is scheduled to start individual therapy with [facility] in few days.  I review her chart.  She is no longer taking eyedrops.  She never picked up Trileptal, Abilify, hydroxyzine.  She is in the process of getting psychological testing from Dr. Glennon Hamilton and hoping to have testing done on June 16.  We have recommended to rule out ADHD.  Now patient believes that she may have narcolepsy because she is very tired in the end of the day and thinking about Provigil.  Patient admitted having crying spells, sometimes hopeless and fatigue but denies any hallucination, paranoia or any suicidal thoughts or homicidal thought.  She has not back to work since April 13.  She is afraid to lose her job because she is sad no one likes and support at work.  She is taking  Inderal it is helping her migraine.  She is taking Valium has helped her anxiety and panic attacks.  She is taking sertraline because she afraid if she stops she will be more depressed.  Sometimes she takes trazodone when she cannot sleep.  She reported her appetite is fair.  She reported her weight is stable.  She has mild tremors in her hand which is even before starting psychotropic medication.  She denies any mania but feel her mood fluctuates and remained mostly depressed and sad.  She is not drinking or using any illegal substances.  Patient is a Marine scientist at [employer].    Past Psychiatric History:Reviewed. H/Odepression, anxiety and PTSD since 54 in [place] when living with ex-husband. Tried Paxil, Prozac, nortriptyline, amitriptyline, Wellbutrin, Depakote, Seroquel, Risperdal, Remeron, nefazodone and Cymbalta. Zoloft helped the most. H/Otwice inpatient because of suicidal thoughts. H/O IOP. No history of suicidal attempt.   Psychiatric Specialty Exam: Physical Exam  ROS  There were no vitals taken for this visit.There is no height or weight on file to calculate BMI.  General Appearance: Casual  Eye Contact:  Fair  Speech:  Clear and Coherent  Volume:  Decreased  Mood:  Anxious, Dysphoric and emotional  Affect:  Labile  Thought Process:  Descriptions of Associations: Circumstantial  Orientation:  Full (Time, Place, and Person)  Thought Content:  Rumination  Suicidal Thoughts:  No  Homicidal Thoughts:  No  Memory:  Immediate;  Fair Recent;   Good Remote;   Good  Judgement:  Fair  Insight:  Fair  Psychomotor Activity:  Increased  Concentration:  Concentration: Fair and Attention Span: Fair  Recall:  FiservFair  Fund of Knowledge:  Fair  Language:  Good  Akathisia:  tremors in hand  Handed:  Right  AIMS (if indicated):     Assets:  Communication Skills Desire for Improvement Housing Resilience  ADL's:  Intact  Cognition:  WNL  Sleep:   fair       Assessment and Plan: Major depressive disorder, recurrent.  Generalized anxiety disorder.  Rule out ADHD.  I reviewed her medication.  She is no longer taking her eyedrops, Vistaril, pain medication.  She never picked up Abilify and Trileptal.  She is taking vitamins and like to come off from psychotropic medication in the future.  She started portable TMS device a week ago and noticed some improvement.  She wants to continue Inderal 20 mg at bedtime to help migraine, Zoloft 150 mg daily, trazodone 100 mg as needed for insomnia and Valium 5 mg at bedtime.  I had a long discussion with the patient about her long-term prognosis, medication side effects and benefits.  We will not start any new medication as patient like to come off from psychotropic medication and give more time to portable TMS and I would also await psychological testing results with Dr. Jason FilaBray.  I will also refer her to neurology for sleep studies to rule out narcolepsy.  She like to continue current psychotropic medication for now.  I do not think at this time patient can go back to work.  I recommend she should file for FMLA and short-term disability.  [facility]  Recommended to call us back if she has any question, concern if she feels worsening of the symptom.  Follow-up in 4 weeks.  Follow Up Instructions:    I discussed the assessment and treatment plan with the patient. The patient was provided an opportunity to ask questions and all were answered. The patient agreed with the plan and demonstrated an understanding of the instructions.   The patient was advised to call back or seek an in-person evaluation if the symptoms worsen or if the condition fails to improve as anticipated.  I provided 30 minutes of non-face-to-face time during this encounter.   Cleotis NipperSyed T Nikiah Goin, MD

## 2019-04-08 ENCOUNTER — Ambulatory Visit (HOSPITAL_COMMUNITY): Payer: 59 | Admitting: Psychiatry

## 2019-04-08 ENCOUNTER — Ambulatory Visit (INDEPENDENT_AMBULATORY_CARE_PROVIDER_SITE_OTHER): Payer: 59 | Admitting: Psychiatry

## 2019-04-08 ENCOUNTER — Encounter (HOSPITAL_COMMUNITY): Payer: Self-pay | Admitting: Psychiatry

## 2019-04-08 DIAGNOSIS — F4310 Post-traumatic stress disorder, unspecified: Secondary | ICD-10-CM

## 2019-04-08 NOTE — Progress Notes (Signed)
Virtual Visit via Video Note  I connected with [name] on 04/08/19 at  1:00 PM EDT by a video enabled telemedicine application and verified that I am speaking with the correct person using two identifiers.  Location: Patient: Havilah Topor Provider: Lise Auer, LCSW   I discussed the limitations of evaluation and management by telemedicine and the availability of in person appointments. The patient expressed understanding and agreed to proceed.  History of Present Illness: Chronic PTSD and MDD due to adverse life experiences and work related issues.    Observations/Objective: Counselor met with [name] for individual therapy via Webex. Counselor assessed MH symptoms and progress on treatment plan goals. Rashena denied suicidal ideation or self-harm behaviors. Ksenia shared that she has noticed improvements in mood and functioning over the past week. Her me eval went well with her psychiatrist. She has been doing [facility] treatment from home for past 6 days and notices positive outcomes. Her desire remains to get off of longterm use psyc meds. Counselor and [name] processed experience in PHP and IOP. Vani shared about future plans regarding her vocation and job. We processed impact of direction on her MH and overall well-being. Counselor provided community resources to help in these efforts. Counselor used MI skills to process through thoughts and feelings, motivators regarding her next steps in treatment.   Assessment and Plan: Counselor will continue to meet with [name] to address treatment plan goals. Daanya will continue to follow recommendations of providers and implement skills learned in session.  Follow Up Instructions: Counselor will send information for next session via Webex.   I provided 60 minutes of non-face-to-face time during this encounter.   Lise Auer, LCSW

## 2019-04-11 ENCOUNTER — Other Ambulatory Visit: Payer: Self-pay

## 2019-04-11 NOTE — Progress Notes (Signed)
Virtual Visit via Video Note  I connected with [name] on 04/01/2019 at  9:00 AM EDT by a video enabled telemedicine application and verified that I am speaking with the correct person using two identifiers.   I discussed the limitations of evaluation and management by telemedicine and the availability of in person appointments. The patient expressed understanding and agreed to proceed.  I discussed the assessment and treatment plan with the patient. The patient was provided an opportunity to ask questions and all were answered. The patient agreed with the plan and demonstrated an understanding of the instructions.   The patient was advised to call back or seek an in-person evaluation if the symptoms worsen or if the condition fails to improve as anticipated.  I provided 180 minutes of non-face-to-face time during this encounter.   [name], LCSW     Daily Group Progress Note  Program: IOP  Group Time: 9am-12pm  Participation Level: Active  Behavioral Response: Sharing and Attention-Seeking  Type of Therapy:  Group Therapy; process group, psycho-educational group  Summary of Progress:  THe purpose of this group is to utilize CBT and DBT skills in process and psycho-educational groups to increase use of healthy coping skills and decrease frequency and intensity of active mental health symptoms.  9am-10:30am Clinician checked in with group members, assessing for SI/HI/psychosis and overall level of functioning. Clinician provided clients time to process recent stressors and effects on mental health symptoms 10:30am-12pm Clinician and group members reviewed types of boundaries. Clinician and group members discussed ways to identify, implement, and maintain healthy boundaries with self and others. Clinician and group members role played sharing boundaries with others. Client engaged in group discussions. Client required redirection on several occasions but was  somewhat receptive to group members feedback. Client voiced concerns with returning to work ongoing but does believe she has some skills from group which would be helpful.  [name], LCSW

## 2019-04-12 ENCOUNTER — Telehealth (HOSPITAL_COMMUNITY): Payer: Self-pay | Admitting: Psychiatry

## 2019-04-12 ENCOUNTER — Ambulatory Visit (INDEPENDENT_AMBULATORY_CARE_PROVIDER_SITE_OTHER): Payer: 59 | Admitting: Psychology

## 2019-04-12 DIAGNOSIS — F341 Dysthymic disorder: Secondary | ICD-10-CM

## 2019-04-12 DIAGNOSIS — F4310 Post-traumatic stress disorder, unspecified: Secondary | ICD-10-CM | POA: Diagnosis not present

## 2019-04-12 DIAGNOSIS — F331 Major depressive disorder, recurrent, moderate: Secondary | ICD-10-CM

## 2019-04-12 DIAGNOSIS — F909 Attention-deficit hyperactivity disorder, unspecified type: Secondary | ICD-10-CM

## 2019-04-14 ENCOUNTER — Telehealth (HOSPITAL_COMMUNITY): Payer: Self-pay | Admitting: Psychiatry

## 2019-04-22 ENCOUNTER — Ambulatory Visit (INDEPENDENT_AMBULATORY_CARE_PROVIDER_SITE_OTHER): Payer: 59 | Admitting: Psychiatry

## 2019-04-22 ENCOUNTER — Other Ambulatory Visit: Payer: Self-pay

## 2019-04-22 DIAGNOSIS — F319 Bipolar disorder, unspecified: Secondary | ICD-10-CM

## 2019-04-22 DIAGNOSIS — F411 Generalized anxiety disorder: Secondary | ICD-10-CM | POA: Diagnosis not present

## 2019-04-22 DIAGNOSIS — F4310 Post-traumatic stress disorder, unspecified: Secondary | ICD-10-CM

## 2019-04-22 NOTE — Progress Notes (Signed)
Virtual Visit via Video Note  I connected with Sandra Herring on 04/22/19 at  9:00 AM EDT by a video enabled telemedicine application and verified that I am speaking with the correct person using two identifiers.  Location: Patient: Sandra Herring Provider: Lise Auer, LCSW   I discussed the limitations of evaluation and management by telemedicine and the availability of in person appointments. The patient expressed understanding and agreed to proceed.  History of Present Illness: PTSD, Bipolar 1 and GAD   Observations/Objective: Counselor met with Sandra Herring for individual therapy via Webex. Counselor assessed MH symptoms and progress on treatment plan goals. Sandra Herring denied suicidal ideation or self-harm behaviors. Sandra Herring shared that she has been focusing her energy on being calm, self-compassion and finding her purpose. Counselor allowed Sandra Herring she express her thoughts and reflections for processing. Counselor explored some of her cognitive beliefs about herself and the world. We identified a longing to be accepted, loved and to belong in a group or to a person. Sandra Herring expressed being emotionally, mentally, and spiritually tired of trying to be someone she is not. We worked on the concepts of giving permission, peace, healing, acceptance and boundaries. Sandra Herring expressed distress around her short term disability status and ability to meet her basic needs with income uncertain. She likened it to a trauma trigger and functioning in her survival brain, which causes progress on treatment goals to be more challenging. Counselor to follow up with psyc doctor and nurse for status of disability.   Assessment and Plan: Counselor will continue to meet with Sandra Herring to address treatment plan goals. Sandra Herring will continue to follow recommendations of providers and implement skills learned in session.  Follow Up Instructions: Counselor will send information for next session via Webex.     I discussed the  assessment and treatment plan with the patient. The patient was provided an opportunity to ask questions and all were answered. The patient agreed with the plan and demonstrated an understanding of the instructions.   The patient was advised to call back or seek an in-person evaluation if the symptoms worsen or if the condition fails to improve as anticipated.  I provided 60 minutes of non-face-to-face time during this encounter.   Lise Auer, LCSW

## 2019-04-26 ENCOUNTER — Ambulatory Visit (HOSPITAL_COMMUNITY): Payer: 59 | Admitting: Psychiatry

## 2019-04-27 ENCOUNTER — Other Ambulatory Visit: Payer: Self-pay

## 2019-04-27 ENCOUNTER — Ambulatory Visit (INDEPENDENT_AMBULATORY_CARE_PROVIDER_SITE_OTHER): Payer: 59 | Admitting: Psychiatry

## 2019-04-27 ENCOUNTER — Encounter (HOSPITAL_COMMUNITY): Payer: Self-pay | Admitting: Psychiatry

## 2019-04-27 DIAGNOSIS — F4310 Post-traumatic stress disorder, unspecified: Secondary | ICD-10-CM | POA: Diagnosis not present

## 2019-04-27 DIAGNOSIS — F319 Bipolar disorder, unspecified: Secondary | ICD-10-CM

## 2019-04-27 DIAGNOSIS — F411 Generalized anxiety disorder: Secondary | ICD-10-CM

## 2019-04-27 NOTE — Progress Notes (Signed)
Virtual Visit via Video Note  I connected with [name] on 04/27/19 at  1:00 PM EDT by a video enabled telemedicine application and verified that I am speaking with the correct person using two identifiers.  Location: Patient: Sandra Herring Provider: Lise Auer, LCSW   I discussed the limitations of evaluation and management by telemedicine and the availability of in person appointments. The patient expressed understanding and agreed to proceed.  History of Present Illness: Bipolar 1, PTSD, GAD   Observations/Objective: Counselor met with Sandra Herring for individual therapy via Webex. Counselor assessed MH symptoms and progress on treatment plan goals. Sandra Herring denied suicidal ideation or self-harm behaviors. Sandra Herring shared that she is taking medications as prescribed, continuing [place] treatment from home, and has been able to spend time with people and started organizing her home. She reports feeling more peacefulness and calm in her situation, however it is an active fight against anxieties and depression that comes in. Counselor explored her place of living history, her love for children, nursing, her family, and longing to feel at home somewhere to constant moving and instability. Counselor and Sandra Herring walked through her home via Webex to determine her plan of action with cleaning her home to the point that people feel more comfortable being in her space. Counselor praised Sandra Herring for the work she is doing and will send her resources on steps to a clean home.   Assessment and Plan: Counselor will continue to meet with Sandra Herring to address treatment plan goals. Sandra Herring will continue to follow recommendations of providers and implement skills learned in session.  Follow Up Instructions: Counselor will send information for next session via Webex.     I discussed the assessment and treatment plan with the patient. The patient was provided an opportunity to ask questions and all were answered.  The patient agreed with the plan and demonstrated an understanding of the instructions.   The patient was advised to call back or seek an in-person evaluation if the symptoms worsen or if the condition fails to improve as anticipated.  I provided 60 minutes of non-face-to-face time during this encounter.   Lise Auer, LCSW

## 2019-05-02 ENCOUNTER — Other Ambulatory Visit: Payer: Self-pay

## 2019-05-02 ENCOUNTER — Ambulatory Visit (INDEPENDENT_AMBULATORY_CARE_PROVIDER_SITE_OTHER): Payer: 59 | Admitting: Psychiatry

## 2019-05-02 DIAGNOSIS — F4310 Post-traumatic stress disorder, unspecified: Secondary | ICD-10-CM

## 2019-05-02 DIAGNOSIS — F411 Generalized anxiety disorder: Secondary | ICD-10-CM

## 2019-05-02 DIAGNOSIS — F319 Bipolar disorder, unspecified: Secondary | ICD-10-CM | POA: Diagnosis not present

## 2019-05-03 ENCOUNTER — Other Ambulatory Visit (HOSPITAL_COMMUNITY): Payer: Self-pay | Admitting: Psychiatry

## 2019-05-03 ENCOUNTER — Encounter (HOSPITAL_COMMUNITY): Payer: Self-pay | Admitting: Psychiatry

## 2019-05-03 DIAGNOSIS — F411 Generalized anxiety disorder: Secondary | ICD-10-CM

## 2019-05-03 NOTE — Progress Notes (Signed)
Virtual Visit via Video Note  I connected with Sandra Herring on 05/03/19 at  2:00 PM EDT by a video enabled telemedicine application and verified that I am speaking with the correct person using two identifiers.  Location: Patient: Sandra Herring Provider: Lise Auer, LCSW   I discussed the limitations of evaluation and management by telemedicine and the availability of in person appointments. The patient expressed understanding and agreed to proceed.  History of Present Illness: PTSD, GAD, Bipolar Disorder   Observations/Objective: Counselor met with Sandra Herring for individual therapy via Webex. Counselor assessed MH symptoms and progress on treatment plan goals. Sandra Herring denied suicidal ideation or self-harm behaviors. Sandra Herring shared that she has been having more indepth conversations with her mother about their past/childhood that has been difficult but healing for her. Counselor provided psychoeducation on how mental health and wellness impacts others journey to mental health and wellness. Counselor reviewed homework with Sandra Herring, with Sandra Herring reporting that she was able to work on organizing and cleaning her house. Counselor processed areas where she feels stuck and her motivators for a cleaner home. Counselor provided encouragement and recognition of Sandra Herring's progress on goals and overall mental health. Sandra Herring is truly putting in the time and energy to address unattended to issues and her goals to get to a healthier and happier place in life. Sandra Herring shared a journal entry and we processed it's content. Sandra Herring expressed concern about short term disability paperwork not being filed in time, causing a lack of income, which puts her at-risk of experiencing increased anxiety. Counselor will follow up with staff to determine if it has been processed.   Assessment and Plan: Counselor will continue to meet with Sandra Herring to address treatment plan goals. Sandra Herring will continue to follow recommendations of  providers and implement skills learned in session.  Follow Up Instructions: Counselor will send information for next session via Webex.     I discussed the assessment and treatment plan with the patient. The patient was provided an opportunity to ask questions and all were answered. The patient agreed with the plan and demonstrated an understanding of the instructions.   The patient was advised to call back or seek an in-person evaluation if the symptoms worsen or if the condition fails to improve as anticipated.  I provided 60 minutes of non-face-to-face time during this encounter.   Lise Auer, LCSW

## 2019-05-09 ENCOUNTER — Encounter (HOSPITAL_COMMUNITY): Payer: Self-pay | Admitting: Psychiatry

## 2019-05-09 ENCOUNTER — Other Ambulatory Visit: Payer: Self-pay

## 2019-05-09 ENCOUNTER — Ambulatory Visit (INDEPENDENT_AMBULATORY_CARE_PROVIDER_SITE_OTHER): Payer: 59 | Admitting: Psychiatry

## 2019-05-09 DIAGNOSIS — F411 Generalized anxiety disorder: Secondary | ICD-10-CM

## 2019-05-09 DIAGNOSIS — F319 Bipolar disorder, unspecified: Secondary | ICD-10-CM

## 2019-05-09 DIAGNOSIS — F4310 Post-traumatic stress disorder, unspecified: Secondary | ICD-10-CM

## 2019-05-09 NOTE — Progress Notes (Signed)
Virtual Visit via Video Note  I connected with [name] on 05/09/19 at  1:00 PM EDT by a video enabled telemedicine application and verified that I am speaking with the correct person using two identifiers.  Location: Patient: Lam Bjorklund Provider: Lise Auer, LCSW   I discussed the limitations of evaluation and management by telemedicine and the availability of in person appointments. The patient expressed understanding and agreed to proceed.  History of Present Illness: PTSD, Bipolar 1 and GAD   Observations/Objective: Counselor met with [name] for individual therapy via Webex. Counselor assessed MH symptoms and progress on treatment plan goals. Othell denied suicidal ideation or self-harm behaviors. Pelagia shared that she continues to make progress towards preparing her home for her son to move in in September. Counselor praised her for the work she is doing and that her daughter also has noted the positive changes she is making. Counselor and Latausha explored daily function, which Freada notes improvements. Counselor processed communication, boundaries, expectations and tapping into her purpose and identity. Counselor will continue to support skill development in these areas and Hye will continue to implement skills between sessions.   Assessment and Plan: Counselor will continue to meet with [name] to address treatment plan goals. Zaide will continue to follow recommendations of providers and implement skills learned in session.  Follow Up Instructions: Counselor will send information for next session via Webex.     I discussed the assessment and treatment plan with the patient. The patient was provided an opportunity to ask questions and all were answered. The patient agreed with the plan and demonstrated an understanding of the instructions.   The patient was advised to call back or seek an in-person evaluation if the symptoms worsen or if the condition fails to  improve as anticipated.  I provided 60 minutes of non-face-to-face time during this encounter.   Lise Auer, LCSW

## 2019-05-10 ENCOUNTER — Ambulatory Visit (INDEPENDENT_AMBULATORY_CARE_PROVIDER_SITE_OTHER): Payer: 59 | Admitting: Psychology

## 2019-05-10 DIAGNOSIS — F4310 Post-traumatic stress disorder, unspecified: Secondary | ICD-10-CM

## 2019-05-10 DIAGNOSIS — F330 Major depressive disorder, recurrent, mild: Secondary | ICD-10-CM

## 2019-05-10 DIAGNOSIS — F341 Dysthymic disorder: Secondary | ICD-10-CM

## 2019-05-16 ENCOUNTER — Ambulatory Visit (HOSPITAL_COMMUNITY): Payer: 59 | Admitting: Psychiatry

## 2019-05-23 ENCOUNTER — Ambulatory Visit (INDEPENDENT_AMBULATORY_CARE_PROVIDER_SITE_OTHER): Payer: 59 | Admitting: Psychiatry

## 2019-05-23 ENCOUNTER — Other Ambulatory Visit: Payer: Self-pay

## 2019-05-23 ENCOUNTER — Ambulatory Visit (HOSPITAL_COMMUNITY): Payer: 59 | Admitting: Psychiatry

## 2019-05-23 DIAGNOSIS — F4310 Post-traumatic stress disorder, unspecified: Secondary | ICD-10-CM | POA: Diagnosis not present

## 2019-05-23 DIAGNOSIS — F319 Bipolar disorder, unspecified: Secondary | ICD-10-CM | POA: Diagnosis not present

## 2019-05-24 ENCOUNTER — Encounter (HOSPITAL_COMMUNITY): Payer: Self-pay | Admitting: Psychiatry

## 2019-05-24 ENCOUNTER — Ambulatory Visit (INDEPENDENT_AMBULATORY_CARE_PROVIDER_SITE_OTHER): Payer: 59 | Admitting: Psychiatry

## 2019-05-24 ENCOUNTER — Other Ambulatory Visit: Payer: Self-pay

## 2019-05-24 DIAGNOSIS — F411 Generalized anxiety disorder: Secondary | ICD-10-CM

## 2019-05-24 DIAGNOSIS — F330 Major depressive disorder, recurrent, mild: Secondary | ICD-10-CM | POA: Diagnosis not present

## 2019-05-24 DIAGNOSIS — F4310 Post-traumatic stress disorder, unspecified: Secondary | ICD-10-CM

## 2019-05-24 MED ORDER — SERTRALINE HCL 100 MG PO TABS: 150.0000 mg | ORAL_TABLET | Freq: Every day | ORAL | 1 refills | Status: DC

## 2019-05-24 MED ORDER — DIAZEPAM 5 MG PO TABS: ORAL_TABLET | ORAL | 1 refills | Status: DC

## 2019-05-24 MED ORDER — PROPRANOLOL HCL 20 MG PO TABS: 20.0000 mg | ORAL_TABLET | Freq: Every day | ORAL | 0 refills | Status: DC

## 2019-05-24 MED ORDER — BUPROPION HCL 75 MG PO TABS: 75.0000 mg | ORAL_TABLET | Freq: Every morning | ORAL | 1 refills | Status: DC

## 2019-05-24 NOTE — Progress Notes (Signed)
Virtual Visit via Video Note  I connected with [name] on 05/24/19 at  4:00 PM EDT by a video enabled telemedicine application and verified that I am speaking with the correct person using two identifiers.  Location: Patient: Sandra Herring Provider: Lise Auer, LCSW   I discussed the limitations of evaluation and management by telemedicine and the availability of in person appointments. The patient expressed understanding and agreed to proceed.  History of Present Illness: PTSD and Bipolar 1 DO   Observations/Objective: Counselor met with [name] for individual therapy via Webex. Counselor assessed MH symptoms and progress on treatment plan goals. Nekayla denied suicidal ideation or self-harm behaviors. Antonique shared that she has been applying her cognitive coping skills and communication skills with family members. Counselor explored her work in this area. Counselor and Pema identified the theme of boundary and structure setting in relationships and with her inner self. Counselor challenged her to explore this concept more in-depth as homework. Waylynn reported success in her efforts to reduce cigarette use. Counselor encouraged Emiya to connect with a local support service.  Assessment and Plan: Counselor will continue to meet with [name] to address treatment plan goals. Sinead will continue to follow recommendations of providers and implement skills learned in session.  Follow Up Instructions: Counselor will send information for next session via Webex.     I discussed the assessment and treatment plan with the patient. The patient was provided an opportunity to ask questions and all were answered. The patient agreed with the plan and demonstrated an understanding of the instructions.   The patient was advised to call back or seek an in-person evaluation if the symptoms worsen or if the condition fails to improve as anticipated.  I provided 60 minutes of  non-face-to-face time during this encounter.   Lise Auer, LCSW

## 2019-05-24 NOTE — Progress Notes (Signed)
Virtual Visit via Telephone Note  I connected with Sandra Herring on 05/24/19 at  1:00 PM EDT by telephone and verified that I am speaking with the correct person using two identifiers.   I discussed the limitations, risks, security and privacy concerns of performing an evaluation and management service by telephone and the availability of in person appointments. I also discussed with the patient that there may be a patient responsible charge related to this service. The patient expressed understanding and agreed to proceed.   History of Present Illness: Patient was evaluated by phone session.  She is doing better since started therapy.  She is still due twice a week portable [place] treatment and she noticed much improvement in her mood.  However she still struggle with lack of focus, attention, concentration and get easily distracted and emotional.  She had psychological testing by Sandra Herring and she was told she may have underlying ADD but also personality disorder.  She realized that she need to see a therapist on a regular basis.  She noticed improvement in the relationship with her mother.  She is sleeping better and really takes trazodone.  She also like to cut down her Valium.  She wants to try Wellbutrin because she was told by Sandra Herring and that low-dose Wellbutrin may help her attention focus.  She had tried Wellbutrin 12 years ago but do not remember the response very well.  She is getting therapy from Sandra Herring but Sandra Herring is booked and she has to find different therapist so she can see her get therapy more often.  Patient is currently on disability.  Her appetite is okay.  Overall she feels her mood is getting better and she denies any agitation, crying spells or any suicidal thoughts.  She denies drinking or using any illegal substances.   Past Psychiatric History:Reviewed. H/Odepression, anxiety and PTSD since 1994 in [place] with ex-husband. Tried Paxil, Prozac,  nortriptyline, amitriptyline, Wellbutrin, Depakote, lamictal, Seroquel,Risperdal, Remeron, nefazodone and Cymbalta. Zoloft helped the most. H/Otwice inpatient because of suicidal thoughts. H/O IOP.No history of suicidal attempt.   Psychiatric Specialty Exam: Physical Exam  ROS  There were no vitals taken for this visit.There is no height or weight on file to calculate BMI.  General Appearance: NA  Eye Contact:  NA  Speech:  Slow  Volume:  Normal  Mood:  Anxious and emotional  Affect:  NA  Thought Process:  Descriptions of Associations: Circumstantial  Orientation:  Full (Time, Place, and Person)  Thought Content:  Rumination  Suicidal Thoughts:  No  Homicidal Thoughts:  No  Memory:  Immediate;   Fair Recent;   Fair Remote;   Good  Judgement:  Fair  Insight:  Fair  Psychomotor Activity:  NA  Concentration:  Concentration: Fair and Attention Span: Fair  Recall:  [employer] of Knowledge:  Good  Language:  Good  Akathisia:  No  Handed:  Right  AIMS (if indicated):     Assets:  Communication Skills Desire for Improvement Housing Resilience  ADL's:  Intact  Cognition:  WNL  Sleep:   fair      Assessment and Plan: Major depressive disorder, recurrent.  Generalized anxiety disorder.  Rule out ADHD.  Discussed current medication.  She wants to continue portable [place] treatment which she believe helping her mood.  She like to reduce Valium since she is sleeping much better and she has not taken trazodone in a while.  I will reduce Valium to 2.5 mg to 5  mg if needed for insomnia.  Continue Zoloft and 50 mg daily, Inderal 20 mg at bedtime for migraine and we will start Wellbutrin 75 mg in the morning to help her attention and focus.  However I informed that it may can cause worsening of mood and emotions.  She agreed that she will call us if she is noticed any change in her mood.  I also encouraged that she need to continue therapy for coping skills.  Patient currently on  short-term disability.  We will get psychological testing results from [facility].  She does not need trazodone as she is sleeping better.  Follow-up in 2 months.  Time spent 30 minutes.  More than 50% of the time spent in psychoeducation, counseling and coordination of care.  Follow Up Instructions:    I discussed the assessment and treatment plan with the patient. The patient was provided an opportunity to ask questions and all were answered. The patient agreed with the plan and demonstrated an understanding of the instructions.   The patient was advised to call back or seek an in-person evaluation if the symptoms worsen or if the condition fails to improve as anticipated.  I provided 30 minutes of non-face-to-face time during this encounter.   Sandra NipperSyed T Kanchan Gal, MD

## 2019-06-10 ENCOUNTER — Encounter (INDEPENDENT_AMBULATORY_CARE_PROVIDER_SITE_OTHER): Payer: 59 | Admitting: Ophthalmology

## 2019-06-17 ENCOUNTER — Other Ambulatory Visit (HOSPITAL_COMMUNITY): Payer: Self-pay | Admitting: Psychiatry

## 2019-06-17 DIAGNOSIS — F411 Generalized anxiety disorder: Secondary | ICD-10-CM

## 2019-06-17 DIAGNOSIS — F330 Major depressive disorder, recurrent, mild: Secondary | ICD-10-CM

## 2019-06-17 DIAGNOSIS — F4310 Post-traumatic stress disorder, unspecified: Secondary | ICD-10-CM

## 2019-06-28 ENCOUNTER — Encounter (HOSPITAL_COMMUNITY): Payer: Self-pay | Admitting: Psychiatry

## 2019-06-28 ENCOUNTER — Ambulatory Visit (INDEPENDENT_AMBULATORY_CARE_PROVIDER_SITE_OTHER): Payer: 59 | Admitting: Psychiatry

## 2019-06-28 ENCOUNTER — Other Ambulatory Visit: Payer: Self-pay

## 2019-06-28 DIAGNOSIS — F4310 Post-traumatic stress disorder, unspecified: Secondary | ICD-10-CM

## 2019-06-28 DIAGNOSIS — F411 Generalized anxiety disorder: Secondary | ICD-10-CM | POA: Diagnosis not present

## 2019-06-28 DIAGNOSIS — F330 Major depressive disorder, recurrent, mild: Secondary | ICD-10-CM | POA: Diagnosis not present

## 2019-06-28 MED ORDER — TRAZODONE HCL 100 MG PO TABS: ORAL_TABLET | ORAL | 0 refills | Status: DC

## 2019-06-28 MED ORDER — BUPROPION HCL 75 MG PO TABS: 75.0000 mg | ORAL_TABLET | Freq: Every morning | ORAL | 2 refills | Status: DC

## 2019-06-28 MED ORDER — SERTRALINE HCL 100 MG PO TABS: 150.0000 mg | ORAL_TABLET | Freq: Every day | ORAL | 1 refills | Status: DC

## 2019-06-28 NOTE — Progress Notes (Signed)
Virtual Visit via Telephone Note  I connected with Sandra Herring on 06/28/19 at  2:00 PM EDT by telephone and verified that I am speaking with the correct person using two identifiers.   I discussed the limitations, risks, security and privacy concerns of performing an evaluation and management service by telephone and the availability of in person appointments. I also discussed with the patient that there may be a patient responsible charge related to this service. The patient expressed understanding and agreed to proceed.   History of Present Illness: Patient was evaluated by phone session.  She is doing much better since we added low-dose Wellbutrin.  Her attention and concentration is improved.  She is using portable TMS device and that had helped her a lot in her depression and anxiety.  She started walking 5 times a week with a dog for 45 minutes.  She also started cooking and making meals which she has not done in the past.  She still have dreams and nightmares but she does not see herself involved in the dream but reported just watching and observing.  Mostly she see her mother and the dreams.  Patient told her mother is very narcissistic and she tried to talk to her but after sometimes she gets irritated.  Her short-term disability is going to end on October 2 and then she is hoping that her long-term disability will resume.  She admitted lately not able to schedule regularly with [facility] due to conflict of schedule but she has appointment tomorrow.  Her migraine is better with the Inderal however we recommended that she should see neurology for the management of migraine.  Patient also like to resume Botox which she has done 12 years ago with good response.  She is less emotional and less irritable.  She less anxious.  She has no tremors or shakes.  She like to continue her current medication.  She denies any agitation or any anger.  We are still waiting records from [name] who did  psychological testing on her.  Patient denies drinking or using any illegal substances.  She denies any suicidal thoughts.    Past Psychiatric History:Reviewed. H/Odepression, anxiety and PTSD since 1994 in [place] with ex-husband. Tried Paxil, Prozac, nortriptyline, amitriptyline, Wellbutrin, Depakote, lamictal, Seroquel,Risperdal, Remeron, nefazodone and Cymbalta. Zoloft helped the most. H/Otwice inpatient because of suicidal thoughts. H/O IOP.No history of suicidal attempt.    Psychiatric Specialty Exam: Physical Exam  ROS  There were no vitals taken for this visit.There is no height or weight on file to calculate BMI.  General Appearance: NA  Eye Contact:  NA  Speech:  Clear and Coherent and Normal Rate  Volume:  Normal  Mood:  Anxious  Affect:  NA  Thought Process:  Descriptions of Associations: Circumstantial  Orientation:  Full (Time, Place, and Person)  Thought Content:  Rumination  Suicidal Thoughts:  No  Homicidal Thoughts:  No  Memory:  Immediate;   Fair Recent;   Fair Remote;   Good  Judgement:  Fair  Insight:  Good  Psychomotor Activity:  NA  Concentration:  Concentration: Fair and Attention Span: Fair  Recall:  Good  Fund of Knowledge:  Fair  Language:  Good  Akathisia:  No  Handed:  Right  AIMS (if indicated):     Assets:  Communication Skills Desire for Improvement Housing  ADL's:  Intact  Cognition:  WNL  Sleep:   improved but still having dreams      Assessment and  Plan: Posttraumatic stress disorder.  Major depressive disorder, recurrent.  Generalized anxiety disorder.  Rule out ADD.  Patient doing much better with medication compliance.  She has cut down her Valium and taking half tablet as needed for insomnia.  I will continue Zoloft and 50 mg daily, Wellbutrin 75 mg in the morning.  I recommend that she should continue Inderal but in the future discussed with neurology for migraine headache management.  She like to resume  Botox in the future which had helped her in the past.  We are still awaiting records from [facility] Bray who did psychological testing.  Patient is still not able to go back to work and she is hoping her short-term disability will convert into long-term disability.  I recommend if she need any help and forms to fill out then she should call our office.  Discuss safety concern that anytime having active suicidal thoughts or homicidal thought injury to call 911 or go to local emergency room.  Follow-up in 6 weeks.  Time spent 30 minutes.  More than 50% of the time spent in psychoeducation, counseling and coordination of care.  Follow Up Instructions:    I discussed the assessment and treatment plan with the patient. The patient was provided an opportunity to ask questions and all were answered. The patient agreed with the plan and demonstrated an understanding of the instructions.   The patient was advised to call back or seek an in-person evaluation if the symptoms worsen or if the condition fails to improve as anticipated.  I provided 30 minutes of non-face-to-face time during this encounter.   Cleotis NipperSyed T Emonte Dieujuste, MD

## 2019-07-05 ENCOUNTER — Other Ambulatory Visit: Payer: Self-pay

## 2019-07-05 ENCOUNTER — Ambulatory Visit (INDEPENDENT_AMBULATORY_CARE_PROVIDER_SITE_OTHER): Payer: 59 | Admitting: Psychiatry

## 2019-07-05 DIAGNOSIS — F4310 Post-traumatic stress disorder, unspecified: Secondary | ICD-10-CM | POA: Diagnosis not present

## 2019-07-05 DIAGNOSIS — F411 Generalized anxiety disorder: Secondary | ICD-10-CM | POA: Diagnosis not present

## 2019-07-05 DIAGNOSIS — F330 Major depressive disorder, recurrent, mild: Secondary | ICD-10-CM

## 2019-07-05 NOTE — Progress Notes (Signed)
Virtual Visit via Video Note  I connected with Sandra Herring on 07/05/19 at  2:00 PM EDT by a video enabled telemedicine application and verified that I am speaking with the correct person using two identifiers.  Location: Patient: Sandra Herring Provider: Lise Auer, LCSW   I discussed the limitations of evaluation and management by telemedicine and the availability of in person appointments. The patient expressed understanding and agreed to proceed.  History of Present Illness: PTSD, MDD and GAD   Observations/Objective: Counselor met with Sandra Herring for individual therapy via Webex. Counselor assessed MH symptoms and progress on treatment plan goals. [name] denied suicidal ideation or self-harm behaviors. Sandra Herring shared that she has been having odd and disturbing dreams related to her childhood and things happening in society. Counselor assessed content of dreams, with Sandra Herring sharing about childhood trauma's and her process of healing. Counselor explored medication explanations for changes in her dream experience. Sandra Herring continues to use her "brain stimulator" for at home [place] treatment. She reports positive outcomes. She is concerned that her Wellbutrin is not working like it was. Counselor and Sandra Herring explored her lack of support system and social connection related to her mental health. Counselor and Sandra Herring explored ideas to get her more connected with others in healthy and appropriate ways. Sandra Herring reports longing for human connection and shared the challenges she has faced in the past in social settings. Sandra Herring expressed a concern about the status of her Short-term disability, Counselor to follow up with other providers to determine the status and needs.    Assessment and Plan: Counselor will continue to meet with patient to address treatment plan goals. Patient will continue to follow recommendations of providers and implement skills learned in session.  Follow Up  Instructions: Counselor will send information for next session via Webex.     I discussed the assessment and treatment plan with the patient. The patient was provided an opportunity to ask questions and all were answered. The patient agreed with the plan and demonstrated an understanding of the instructions.   The patient was advised to call back or seek an in-person evaluation if the symptoms worsen or if the condition fails to improve as anticipated.  I provided 60 minutes of non-face-to-face time during this encounter.   Lise Auer, LCSW

## 2019-07-06 ENCOUNTER — Encounter (HOSPITAL_COMMUNITY): Payer: Self-pay | Admitting: Psychiatry

## 2019-07-12 ENCOUNTER — Other Ambulatory Visit: Payer: Self-pay

## 2019-07-12 ENCOUNTER — Ambulatory Visit (INDEPENDENT_AMBULATORY_CARE_PROVIDER_SITE_OTHER): Payer: 59 | Admitting: Psychiatry

## 2019-07-12 DIAGNOSIS — F330 Major depressive disorder, recurrent, mild: Secondary | ICD-10-CM

## 2019-07-12 DIAGNOSIS — F411 Generalized anxiety disorder: Secondary | ICD-10-CM | POA: Diagnosis not present

## 2019-07-12 DIAGNOSIS — F4310 Post-traumatic stress disorder, unspecified: Secondary | ICD-10-CM | POA: Diagnosis not present

## 2019-07-12 NOTE — Progress Notes (Signed)
Virtual Visit via Video Note  I connected with [name] on 07/12/19 at  2:00 PM EDT by a video enabled telemedicine application and verified that I am speaking with the correct person using two identifiers.  Location: Patient: Sandra Herring Provider: Lise Auer, LCSW   I discussed the limitations of evaluation and management by telemedicine and the availability of in person appointments. The patient expressed understanding and agreed to proceed.  History of Present Illness: PTSD, MDD and GAD   Observations/Objective: Counselor met with [name] for individual therapy via Webex. Counselor assessed MH symptoms and progress on treatment plan goals. Lexa denied suicidal ideation or self-harm behaviors. Zaela shared that she decided to go on a nature walk/time in a river/waterfall, despite being hesitant, she had a wonderful time and was able to connect with others in a positive way in the process. Counselor explored the benefits of this outing and her plans to engage in similar outings in the future. Counselor assessed daily functioning and progress with Kincaid. Nkechi shared that she experienced a trauma trigger this past week related to family interactions, which has "set her back mentally and emotionally." Counselor processed traumatic event, shared psychoeducation on PTSD and coping strategies. Counselor explored additional thoughts and coping strategies in regaining a sense of stability and progress.   Assessment and Plan: Counselor will continue to meet with patient to address treatment plan goals. Patient will continue to follow recommendations of providers and implement skills learned in session.  Follow Up Instructions: Counselor will send information for next session via Webex.     I discussed the assessment and treatment plan with the patient. The patient was provided an opportunity to ask questions and all were answered. The patient agreed with the plan and demonstrated  an understanding of the instructions.   The patient was advised to call back or seek an in-person evaluation if the symptoms worsen or if the condition fails to improve as anticipated.  I provided 60 minutes of non-face-to-face time during this encounter.   Lise Auer, LCSW

## 2019-07-13 ENCOUNTER — Encounter (HOSPITAL_COMMUNITY): Payer: Self-pay | Admitting: Psychiatry

## 2019-07-19 ENCOUNTER — Other Ambulatory Visit (HOSPITAL_COMMUNITY): Payer: Self-pay | Admitting: Psychiatry

## 2019-07-19 DIAGNOSIS — F330 Major depressive disorder, recurrent, mild: Secondary | ICD-10-CM

## 2019-07-19 DIAGNOSIS — F411 Generalized anxiety disorder: Secondary | ICD-10-CM

## 2019-07-19 DIAGNOSIS — F4310 Post-traumatic stress disorder, unspecified: Secondary | ICD-10-CM

## 2019-07-25 ENCOUNTER — Other Ambulatory Visit: Payer: Self-pay

## 2019-07-25 ENCOUNTER — Ambulatory Visit (INDEPENDENT_AMBULATORY_CARE_PROVIDER_SITE_OTHER): Payer: 59 | Admitting: Psychiatry

## 2019-07-25 ENCOUNTER — Encounter (HOSPITAL_COMMUNITY): Payer: Self-pay | Admitting: Psychiatry

## 2019-07-25 DIAGNOSIS — F330 Major depressive disorder, recurrent, mild: Secondary | ICD-10-CM

## 2019-07-25 DIAGNOSIS — F411 Generalized anxiety disorder: Secondary | ICD-10-CM

## 2019-07-25 DIAGNOSIS — F4310 Post-traumatic stress disorder, unspecified: Secondary | ICD-10-CM

## 2019-07-25 NOTE — Progress Notes (Signed)
Virtual Visit via Video Note  I connected with [name] on 07/25/19 at  1:00 PM EDT by a video enabled telemedicine application and verified that I am speaking with the correct person using two identifiers.  Location: Patient: Sandra Herring Provider: Lise Auer   I discussed the limitations of evaluation and management by telemedicine and the availability of in person appointments. The patient expressed understanding and agreed to proceed.  History of Present Illness: PTSD, MDD, GAD   Observations/Objective: Observations/Objective: Counselor met with Sandra Herring for individual therapy via Webex. Counselor assessed MH symptoms and progress on treatment plan goals. Sandra Herring denied suicidal ideation or self-harm behaviors. 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 Counselor processed emotions and driving factors in life stressors. Counselor discussed increase of session, as well as highly recommending Sandra Herring connect with Sandra Herring to establish a Nutritional therapist, Support Groups and [facility]. Sandra Herring expressed interest and additionally shared that she is experiencing immense pain in her right wrist and plans to meet with her provider to discuss options. Counselor explored development in self-awareness as it relates to her communication style, boundaries and connections with others. Sandra Herring expressed distress over lack of intimate and close relationships and shared negative cognitions about herself and her lovability. Counselor and Sandra Herring explored outlets and venues to connect with others and as a way to express herself. Counselor shared additional resources with Sandra Herring to connect with to improve since of self, purpose and meaning.   Assessment and Plan: Counselor will continue to meet with patient to address treatment plan goals. Patient will continue to follow recommendations of providers and implement skills  learned in session.  Follow Up Instructions: Counselor will send information for next session via Webex.     I discussed the assessment and treatment plan with the patient. The patient was provided an opportunity to ask questions and all were answered. The patient agreed with the plan and demonstrated an understanding of the instructions.   The patient was advised to call back or seek an in-person evaluation if the symptoms worsen or if the condition fails to improve as anticipated.  I provided 60 minutes of non-face-to-face time during this encounter.   Lise Auer, LCSW

## 2019-07-26 ENCOUNTER — Other Ambulatory Visit (HOSPITAL_COMMUNITY)
Admission: RE | Admit: 2019-07-26 | Discharge: 2019-07-26 | Disposition: A | Discharge status: Home / Self Care | Payer: 59 | Source: Ambulatory Visit | Attending: Orthopedic Surgery | Admitting: Orthopedic Surgery

## 2019-07-26 ENCOUNTER — Ambulatory Visit (HOSPITAL_COMMUNITY): Payer: 59 | Admitting: Psychiatry

## 2019-07-26 ENCOUNTER — Encounter (HOSPITAL_BASED_OUTPATIENT_CLINIC_OR_DEPARTMENT_OTHER): Payer: Self-pay | Admitting: *Deleted

## 2019-07-26 ENCOUNTER — Other Ambulatory Visit: Payer: Self-pay

## 2019-07-26 ENCOUNTER — Other Ambulatory Visit: Payer: Self-pay | Admitting: Orthopedic Surgery

## 2019-07-26 DIAGNOSIS — Z20828 Contact with and (suspected) exposure to other viral communicable diseases: Secondary | ICD-10-CM | POA: Diagnosis not present

## 2019-07-26 DIAGNOSIS — Z01812 Encounter for preprocedural laboratory examination: Secondary | ICD-10-CM | POA: Diagnosis not present

## 2019-07-27 LAB — NOVEL CORONAVIRUS, NAA (HOSP ORDER, SEND-OUT TO REF LAB; TAT 18-24 HRS): SARS-CoV-2, NAA: NOT DETECTED

## 2019-07-29 ENCOUNTER — Encounter (HOSPITAL_BASED_OUTPATIENT_CLINIC_OR_DEPARTMENT_OTHER)
Admission: RE | Disposition: A | Discharge status: Home / Self Care | Payer: Self-pay | Source: Home / Self Care | Attending: Orthopedic Surgery

## 2019-07-29 ENCOUNTER — Ambulatory Visit (HOSPITAL_BASED_OUTPATIENT_CLINIC_OR_DEPARTMENT_OTHER): Payer: 59 | Admitting: Anesthesiology

## 2019-07-29 ENCOUNTER — Ambulatory Visit (HOSPITAL_BASED_OUTPATIENT_CLINIC_OR_DEPARTMENT_OTHER)
Admission: RE | Admit: 2019-07-29 | Discharge: 2019-07-29 | Disposition: A | Discharge status: Home / Self Care | Payer: 59 | Attending: Orthopedic Surgery | Admitting: Orthopedic Surgery

## 2019-07-29 ENCOUNTER — Other Ambulatory Visit: Payer: Self-pay

## 2019-07-29 ENCOUNTER — Encounter (HOSPITAL_BASED_OUTPATIENT_CLINIC_OR_DEPARTMENT_OTHER): Payer: Self-pay

## 2019-07-29 DIAGNOSIS — Z886 Allergy status to analgesic agent status: Secondary | ICD-10-CM | POA: Diagnosis not present

## 2019-07-29 DIAGNOSIS — F4310 Post-traumatic stress disorder, unspecified: Secondary | ICD-10-CM | POA: Diagnosis not present

## 2019-07-29 DIAGNOSIS — Z791 Long term (current) use of non-steroidal anti-inflammatories (NSAID): Secondary | ICD-10-CM | POA: Diagnosis not present

## 2019-07-29 DIAGNOSIS — Z79899 Other long term (current) drug therapy: Secondary | ICD-10-CM | POA: Diagnosis not present

## 2019-07-29 DIAGNOSIS — F329 Major depressive disorder, single episode, unspecified: Secondary | ICD-10-CM | POA: Insufficient documentation

## 2019-07-29 DIAGNOSIS — K219 Gastro-esophageal reflux disease without esophagitis: Secondary | ICD-10-CM | POA: Diagnosis not present

## 2019-07-29 DIAGNOSIS — M654 Radial styloid tenosynovitis [de Quervain]: Secondary | ICD-10-CM | POA: Diagnosis not present

## 2019-07-29 DIAGNOSIS — F17210 Nicotine dependence, cigarettes, uncomplicated: Secondary | ICD-10-CM | POA: Diagnosis not present

## 2019-07-29 DIAGNOSIS — Z888 Allergy status to other drugs, medicaments and biological substances status: Secondary | ICD-10-CM | POA: Insufficient documentation

## 2019-07-29 HISTORY — PX: DORSAL COMPARTMENT RELEASE: SHX5039

## 2019-07-29 SURGERY — RELEASE, FIRST DORSAL COMPARTMENT, HAND
Anesthesia: General | Site: Wrist | Laterality: Left

## 2019-07-29 MED ORDER — CHLORHEXIDINE GLUCONATE 4 % EX LIQD: 60.0000 mL | Freq: Once | CUTANEOUS | Status: DC

## 2019-07-29 MED ORDER — FENTANYL CITRATE (PF) 100 MCG/2ML IJ SOLN
INTRAMUSCULAR | Status: AC
  Filled 2019-07-29: qty 2

## 2019-07-29 MED ORDER — HYDROCODONE-ACETAMINOPHEN 5-325 MG PO TABS: ORAL_TABLET | ORAL | 0 refills | Status: DC

## 2019-07-29 MED ORDER — LIDOCAINE HCL (PF) 0.5 % IJ SOLN
INTRAMUSCULAR | Status: DC | PRN
  Administered 2019-07-29: 30 mL via INTRAVENOUS

## 2019-07-29 MED ORDER — LIDOCAINE HCL (CARDIAC) PF 100 MG/5ML IV SOSY
PREFILLED_SYRINGE | INTRAVENOUS | Status: DC | PRN
  Administered 2019-07-29: 30 mg via INTRAVENOUS

## 2019-07-29 MED ORDER — CEFAZOLIN SODIUM-DEXTROSE 2-4 GM/100ML-% IV SOLN
2.0000 g | INTRAVENOUS | Status: AC
  Administered 2019-07-29: 2 g via INTRAVENOUS

## 2019-07-29 MED ORDER — MIDAZOLAM HCL 2 MG/2ML IJ SOLN
1.0000 mg | INTRAMUSCULAR | Status: DC | PRN
  Administered 2019-07-29: 2 mg via INTRAVENOUS

## 2019-07-29 MED ORDER — CEFAZOLIN SODIUM-DEXTROSE 2-4 GM/100ML-% IV SOLN
INTRAVENOUS | Status: AC
  Filled 2019-07-29: qty 100

## 2019-07-29 MED ORDER — ONDANSETRON HCL 4 MG/2ML IJ SOLN
INTRAMUSCULAR | Status: DC | PRN
  Administered 2019-07-29: 4 mg via INTRAVENOUS

## 2019-07-29 MED ORDER — FENTANYL CITRATE (PF) 100 MCG/2ML IJ SOLN
25.0000 ug | INTRAMUSCULAR | Status: DC | PRN
  Administered 2019-07-29: 50 ug via INTRAVENOUS

## 2019-07-29 MED ORDER — SCOPOLAMINE 1 MG/3DAYS TD PT72
1.0000 | MEDICATED_PATCH | TRANSDERMAL | Status: DC
  Administered 2019-07-29: 1.5 mg via TRANSDERMAL

## 2019-07-29 MED ORDER — FENTANYL CITRATE (PF) 100 MCG/2ML IJ SOLN
50.0000 ug | INTRAMUSCULAR | Status: DC | PRN
  Administered 2019-07-29: 100 ug via INTRAVENOUS

## 2019-07-29 MED ORDER — LACTATED RINGERS IV SOLN
INTRAVENOUS | Status: DC
  Administered 2019-07-29: 13:00:00 via INTRAVENOUS

## 2019-07-29 MED ORDER — 0.9 % SODIUM CHLORIDE (POUR BTL) OPTIME
TOPICAL | Status: DC | PRN
  Administered 2019-07-29: 1000 mL

## 2019-07-29 MED ORDER — SCOPOLAMINE 1 MG/3DAYS TD PT72
MEDICATED_PATCH | TRANSDERMAL | Status: AC
  Filled 2019-07-29: qty 1

## 2019-07-29 MED ORDER — MIDAZOLAM HCL 2 MG/2ML IJ SOLN
INTRAMUSCULAR | Status: AC
  Filled 2019-07-29: qty 2

## 2019-07-29 SURGICAL SUPPLY — 36 items
BLADE MINI RND TIP GREEN BEAV (BLADE) IMPLANT
BLADE SURG 15 STRL LF DISP TIS (BLADE) ×2 IMPLANT
BLADE SURG 15 STRL SS (BLADE) ×2
BNDG ELASTIC 3X5.8 VLCR STR LF (GAUZE/BANDAGES/DRESSINGS) IMPLANT
BNDG ELASTIC 4X5.8 VLCR STR LF (GAUZE/BANDAGES/DRESSINGS) ×1 IMPLANT
BNDG ESMARK 4X9 LF (GAUZE/BANDAGES/DRESSINGS) IMPLANT
BNDG GAUZE ELAST 4 BULKY (GAUZE/BANDAGES/DRESSINGS) ×2 IMPLANT
CHLORAPREP W/TINT 26 (MISCELLANEOUS) ×2 IMPLANT
CORD BIPOLAR FORCEPS 12FT (ELECTRODE) ×2 IMPLANT
COVER BACK TABLE REUSABLE LG (DRAPES) ×2 IMPLANT
COVER MAYO STAND REUSABLE (DRAPES) ×2 IMPLANT
COVER WAND RF STERILE (DRAPES) IMPLANT
CUFF TOURN SGL QUICK 18X4 (TOURNIQUET CUFF) ×2 IMPLANT
DRAPE EXTREMITY T 121X128X90 (DISPOSABLE) ×2 IMPLANT
DRAPE SURG 17X23 STRL (DRAPES) ×2 IMPLANT
DRSG PAD ABDOMINAL 8X10 ST (GAUZE/BANDAGES/DRESSINGS) ×2 IMPLANT
GAUZE SPONGE 4X4 12PLY STRL (GAUZE/BANDAGES/DRESSINGS) ×2 IMPLANT
GAUZE XEROFORM 1X8 LF (GAUZE/BANDAGES/DRESSINGS) ×2 IMPLANT
GLOVE BIO SURGEON STRL SZ7.5 (GLOVE) ×2 IMPLANT
GLOVE BIOGEL PI IND STRL 8 (GLOVE) ×1 IMPLANT
GLOVE BIOGEL PI INDICATOR 8 (GLOVE) ×1
GOWN STRL REUS W/ TWL LRG LVL3 (GOWN DISPOSABLE) ×1 IMPLANT
GOWN STRL REUS W/TWL LRG LVL3 (GOWN DISPOSABLE) ×1
GOWN STRL REUS W/TWL XL LVL3 (GOWN DISPOSABLE) ×2 IMPLANT
NDL HYPO 25X1 1.5 SAFETY (NEEDLE) ×1 IMPLANT
NEEDLE HYPO 25X1 1.5 SAFETY (NEEDLE) ×2 IMPLANT
NS IRRIG 1000ML POUR BTL (IV SOLUTION) ×2 IMPLANT
PACK BASIN DAY SURGERY FS (CUSTOM PROCEDURE TRAY) ×2 IMPLANT
PADDING CAST ABS 4INX4YD NS (CAST SUPPLIES) ×1
PADDING CAST ABS COTTON 4X4 ST (CAST SUPPLIES) ×1 IMPLANT
STOCKINETTE 4X48 STRL (DRAPES) ×2 IMPLANT
SUT ETHILON 4 0 PS 2 18 (SUTURE) ×2 IMPLANT
SYR BULB 3OZ (MISCELLANEOUS) ×2 IMPLANT
SYR CONTROL 10ML LL (SYRINGE) ×2 IMPLANT
TOWEL GREEN STERILE FF (TOWEL DISPOSABLE) ×2 IMPLANT
UNDERPAD 30X36 HEAVY ABSORB (UNDERPADS AND DIAPERS) ×2 IMPLANT

## 2019-07-29 NOTE — Anesthesia Preprocedure Evaluation (Addendum)
Anesthesia Evaluation  Patient identified by MRN, date of birth, ID band Patient awake    Reviewed: Allergy & Precautions, NPO status , Patient's Chart, lab work & pertinent test results  Airway Mallampati: II  TM Distance: >3 FB     Dental   Pulmonary Current Smoker and Patient abstained from smoking.,    breath sounds clear to auscultation       Cardiovascular negative cardio ROS   Rhythm:Regular Rate:Normal     Neuro/Psych    GI/Hepatic Neg liver ROS, GERD  ,  Endo/Other  negative endocrine ROS  Renal/GU negative Renal ROS     Musculoskeletal   Abdominal   Peds  Hematology   Anesthesia Other Findings   Reproductive/Obstetrics                             Anesthesia Physical Anesthesia Plan  ASA: II  Anesthesia Plan: Bier Block and Bier Block-LIDOCAINE ONLY   Post-op Pain Management:    Induction: Intravenous  PONV Risk Score and Plan: Ondansetron, Dexamethasone and Midazolam  Airway Management Planned: Nasal Cannula and Simple Face Mask  Additional Equipment:   Intra-op Plan:   Post-operative Plan:   Informed Consent: I have reviewed the patients History and Physical, chart, labs and discussed the procedure including the risks, benefits and alternatives for the proposed anesthesia with the patient or authorized representative who has indicated his/her understanding and acceptance.     Dental advisory given  Plan Discussed with: CRNA and Anesthesiologist  Anesthesia Plan Comments:        Anesthesia Quick Evaluation

## 2019-07-29 NOTE — Transfer of Care (Signed)
Immediate Anesthesia Transfer of Care Note  Patient: Sandra Herring  Procedure(s) Performed: RELEASE DORSAL COMPARTMENT (DEQUERVAIN) LEFT WRIST (Left Wrist)  Patient Location: PACU  Anesthesia Type:MAC and Bier block  Level of Consciousness: awake, alert  and oriented  Airway & Oxygen Therapy: Patient Spontanous Breathing and Patient connected to face mask oxygen  Post-op Assessment: Report given to RN and Post -op Vital signs reviewed and stable  Post vital signs: Reviewed and stable  Last Vitals:  Vitals Value Taken Time  BP    Temp    Pulse    Resp    SpO2      Last Pain:  Vitals:   07/29/19 1239  TempSrc: Oral  PainSc: 7          Complications: No apparent anesthesia complications

## 2019-07-29 NOTE — Discharge Instructions (Addendum)

## 2019-07-29 NOTE — Op Note (Signed)
NAME: Sandra Herring MEDICAL RECORD NO: [id number] DATE OF BIRTH: 29-Jul-1961 FACILITY: [facility] LOCATION: [facility] PHYSICIAN: Tennis Must, MD   OPERATIVE REPORT   DATE OF PROCEDURE: 07/29/19    PREOPERATIVE DIAGNOSIS:   Left de Quervain's tenosynovitis   POSTOPERATIVE DIAGNOSIS:   Left de Quervain's tenosynovitis   PROCEDURE:   Left first dorsal compartment release   SURGEON:  Leanora Cover, M.D.   ASSISTANT: none   ANESTHESIA:  Bier block with sedation   INTRAVENOUS FLUIDS:  Per anesthesia flow sheet.   ESTIMATED BLOOD LOSS:  Minimal.   COMPLICATIONS:  None.   SPECIMENS:  none   TOURNIQUET TIME:    Total Tourniquet Time Documented: Forearm (Left) - 19 minutes Total: Forearm (Left) - 19 minutes    DISPOSITION:  Stable to PACU.   INDICATIONS: 58 year old female with left de Quervain's tenosynovitis.  This is been injected before with improvement but not resolution.  She wishes to have a first dorsal compartment release. Risks, benefits and alternatives of surgery were discussed including the risks of blood loss, infection, damage to nerves, vessels, tendons, ligaments, bone for surgery, need for additional surgery, complications with wound healing, continued pain, stiffness.  She voiced understanding of these risks and elected to proceed.  OPERATIVE COURSE:  After being identified preoperatively by myself,  the patient and I agreed on the procedure and site of the procedure.  The surgical site was marked.  Surgical consent had been signed. She was given IV antibiotics as preoperative antibiotic prophylaxis. She was transferred to the operating room and placed on the operating table in supine position with the Left upper extremity on an arm board.  Bier block anesthesia was induced by the anesthesiologist.  Left upper extremity was prepped and draped in normal sterile orthopedic fashion.  A surgical pause was performed between the surgeons, anesthesia,  and operating room staff and all were in agreement as to the patient, procedure, and site of procedure.  Tourniquet at the proximal aspect of the forearm had been inflated for the Bier block.    Incision was made at the radial side of the wrist and carried in subcutaneous tissues by spreading technique.  Bipolar electrocautery was used to obtain hemostasis.  The first dorsal compartment was identified.  It was sharply incised from distal to proximal.  Care was taken to ensure complete release of the first dorsal compartment.  The tendons were inspected.  There were no additional septae.  The EPB tendon was identified and had been released.  The wound was copiously irrigated with sterile saline.  Was closed with 4-0 nylon in a horizontal mattress fashion.  It was dressed with sterile Xeroform 4 x 4 and an ABD used as a splint.  This was wrapped with Kerlix and Ace bandage.  The tourniquet was deflated at 19 minutes.  Fingertips were pink with brisk capillary refill after deflation of tourniquet.  The operative  drapes were broken down.  The patient was awoken from anesthesia safely.  She was transferred back to the stretcher and taken to PACU in stable condition.  I will see her back in the office in 1 week for postoperative followup.  I will give her a prescription for Norco 5/325 1-2 tabs PO q6 hours prn pain, dispense # 20.   Leanora Cover, MD Electronically signed, 07/29/19

## 2019-07-29 NOTE — H&P (Signed)
Sandra Herring is an 58 y.o. female.   Chief Complaint: left dequervains tenosynovitis HPI: 58 yo female with left dequervains tenosynovitis.  This has been injected without lasting relief.  She wishes to have a left first dorsal compartment release.  Allergies:  Allergies  Allergen Reactions  . Serzone [Nefazodone] Shortness Of Breath    Breathing difficulty  . Nsaids Other (See Comments)    gastritis  . Lamictal [Lamotrigine] Itching and Rash    Past Medical History:  Diagnosis Date  . Anxiety   . Depression   . GERD (gastroesophageal reflux disease)   . Headache    migraines takes toprol   . PTSD (post-traumatic stress disorder)   . PTSD (post-traumatic stress disorder)   . Seizures ([place])    febrile seizure at age 57 months  . Thyroid nodule   . Vaginal infection     Past Surgical History:  Procedure Laterality Date  . [place] VITRECTOMY WITH 20 GAUGE MVR PORT FOR MACULAR HOLE Right 12/10/2017   Procedure: PARS PLANA VITRECTOMY WITH 25 GAUGE LAZER AND GAS MACULAR HOLE membrane pale gas;  Surgeon: Bernarda Caffey, MD;  Location: [place];  Service: Ophthalmology;  Laterality: Right;  . COLONOSCOPY    . EYE SURGERY     Dr. Lubertha South  . HERNIA REPAIR    . lumbar    . TONSILLECTOMY  2005  . TUBAL LIGATION      Family History: Family History  Problem Relation Age of Onset  . Cataracts Mother   . Depression Mother   . Cataracts Father   . Depression Father   . Cataracts Maternal Grandfather   . Amblyopia Neg Hx   . Blindness Neg Hx   . Glaucoma Neg Hx   . Diabetes Neg Hx   . Macular degeneration Neg Hx   . Retinal detachment Neg Hx   . Strabismus Neg Hx   . Retinitis pigmentosa Neg Hx     Social History:   reports that she has been smoking cigarettes. She has a 0.30 pack-year smoking history. She has never used smokeless tobacco. She reports current alcohol use. She reports that she does not use drugs.  Medications: Medications Prior to Admission   Medication Sig Dispense Refill  . acetaminophen (TYLENOL) 500 MG tablet Take 1,000 mg by mouth daily as needed for moderate pain.     . Ascorbic Acid (VITAMIN C) 1000 MG tablet Take 4,000 mg by mouth daily.    . Cod Liver Oil OIL Take by mouth.    . diazepam (VALIUM) 5 MG tablet Take 1/2 to one tab at bed time 30 tablet 1  . famotidine (PEPCID) 20 MG tablet Take 20 mg by mouth daily as needed for heartburn.     . levocetirizine (XYZAL) 5 MG tablet Take 5 mg by mouth every evening.    . meloxicam (MOBIC) 15 MG tablet meloxicam 15 mg tablet    . metoprolol tartrate (LOPRESSOR) 25 MG tablet Take 25 mg by mouth 2 (two) times daily.     . Misc Natural Products (GINSENG-AMERICAN PO) Take by mouth.    . sertraline (ZOLOFT) 100 MG tablet Take 1.5 tablets (150 mg total) by mouth daily. 45 tablet 1  . traZODone (DESYREL) 100 MG tablet Take 1/2 to one tab as needed for insonia 30 tablet 0    No results found for this or any previous visit (from the past 48 hour(s)).  No results found.   A comprehensive review of  systems was negative.  Blood pressure (!) 151/101, pulse (!) 55, temperature 98.3 F (36.8 C), temperature source Oral, resp. rate 20, height 5\' 8"  (1.727 m), weight 117.6 kg, SpO2 99 %.  General appearance: alert, cooperative and appears stated age Head: Normocephalic, without obvious abnormality, atraumatic Neck: supple, symmetrical, trachea midline Cardio: regular rate and rhythm Resp: clear to auscultation bilaterally Extremities: Intact sensation and capillary refill all digits.  +epl/fpl/io.  No wounds.  Pulses: 2+ and symmetric Skin: Skin color, texture, turgor normal. No rashes or lesions Neurologic: Grossly normal Incision/Wound: none  Assessment/Plan Left dequervains tenosynovitis.  Non operative and operative treatment options have been discussed with the patient and patient wishes to proceed with operative treatment. Risks, benefits, and alternatives of surgery have  been discussed and the patient agrees with the plan of care.   07/29/2019, 1:09 PM

## 2019-07-29 NOTE — Anesthesia Postprocedure Evaluation (Signed)
Anesthesia Post Note  Patient: Sandra Herring  Procedure(s) Performed: RELEASE DORSAL COMPARTMENT (DEQUERVAIN) LEFT WRIST (Left Wrist)     Patient location during evaluation: PACU Anesthesia Type: Bier Block Level of consciousness: awake and alert Pain management: pain level controlled Vital Signs Assessment: post-procedure vital signs reviewed and stable Respiratory status: spontaneous breathing, nonlabored ventilation, respiratory function stable and patient connected to nasal cannula oxygen Cardiovascular status: stable and blood pressure returned to baseline Postop Assessment: no apparent nausea or vomiting Anesthetic complications: no    Last Vitals:  Vitals:   07/29/19 1508 07/29/19 1527  BP:  (!) 140/91  Pulse: 78 72  Resp: 18 18  Temp:  37.1 C  SpO2: 100% 97%    Last Pain:  Vitals:   07/29/19 1527  TempSrc:   PainSc: 5                  Tiajuana Amass

## 2019-08-01 ENCOUNTER — Encounter (HOSPITAL_BASED_OUTPATIENT_CLINIC_OR_DEPARTMENT_OTHER): Payer: Self-pay | Admitting: Orthopedic Surgery

## 2019-08-01 ENCOUNTER — Other Ambulatory Visit: Payer: Self-pay

## 2019-08-01 ENCOUNTER — Ambulatory Visit (INDEPENDENT_AMBULATORY_CARE_PROVIDER_SITE_OTHER): Payer: 59 | Admitting: Psychiatry

## 2019-08-01 DIAGNOSIS — F4310 Post-traumatic stress disorder, unspecified: Secondary | ICD-10-CM | POA: Diagnosis not present

## 2019-08-01 DIAGNOSIS — F411 Generalized anxiety disorder: Secondary | ICD-10-CM | POA: Diagnosis not present

## 2019-08-01 DIAGNOSIS — F330 Major depressive disorder, recurrent, mild: Secondary | ICD-10-CM

## 2019-08-01 NOTE — Progress Notes (Signed)
Virtual Visit via Video Note  I connected with [name] on 08/01/19 at  1:00 PM EDT by a video enabled telemedicine application and verified that I am speaking with the correct person using two identifiers.  Location: Patient: Sandra Herring Provider: Lise Auer, LCSW   I discussed the limitations of evaluation and management by telemedicine and the availability of in person appointments. The patient expressed understanding and agreed to proceed.  History of Present Illness: PTSD, MDD and GAD   Observations/Objective: Counselor met with [name] for individual therapy via Webex. Counselor assessed MH symptoms and progress on treatment plan goals. Dorisann denied suicidal ideation or self-harm behaviors. Khelani presented with mixed emotions and affect expression in session. We landed on a theme of duality in her life, in interactions with others, daily habits, who she believes herself to be vs how other perceive her, connections with family vs strangers, etc. Counselor explored her thoughts and feelings about these beliefs about self, others and the world. Mireyah identified how she has been negatively impacted by this internal conflict. Counselor introduced the idea of acceptance and contentment in her extremes. Jozelynn continues to express sadness related to her lack of connection with others, which causes and is caused by her self isolation. Counselor promoted healthy social and treatment outlets, including [facility] for support groups, peer hang outs, peer support and wellness academy courses. Justiss stated she would call them today.   Assessment and Plan: Counselor will continue to meet with patient to address treatment plan goals. Patient will continue to follow recommendations of providers and implement skills learned in session.  Follow Up Instructions: Counselor will send information for next session via Webex.   I discussed the assessment and treatment plan with the patient. The  patient was provided an opportunity to ask questions and all were answered. The patient agreed with the plan and demonstrated an understanding of the instructions.   The patient was advised to call back or seek an in-person evaluation if the symptoms worsen or if the condition fails to improve as anticipated.  I provided 55 minutes of non-face-to-face time during this encounter.   Lise Auer, LCSW

## 2019-08-03 ENCOUNTER — Encounter (HOSPITAL_COMMUNITY): Payer: Self-pay | Admitting: Psychiatry

## 2019-08-03 ENCOUNTER — Other Ambulatory Visit: Payer: Self-pay

## 2019-08-03 ENCOUNTER — Ambulatory Visit (INDEPENDENT_AMBULATORY_CARE_PROVIDER_SITE_OTHER): Payer: 59 | Admitting: Psychiatry

## 2019-08-03 DIAGNOSIS — F4310 Post-traumatic stress disorder, unspecified: Secondary | ICD-10-CM

## 2019-08-03 DIAGNOSIS — F411 Generalized anxiety disorder: Secondary | ICD-10-CM | POA: Diagnosis not present

## 2019-08-03 DIAGNOSIS — F330 Major depressive disorder, recurrent, mild: Secondary | ICD-10-CM | POA: Diagnosis not present

## 2019-08-03 MED ORDER — DIAZEPAM 5 MG PO TABS: ORAL_TABLET | ORAL | 1 refills | Status: DC

## 2019-08-03 MED ORDER — SERTRALINE HCL 100 MG PO TABS: 150.0000 mg | ORAL_TABLET | Freq: Every day | ORAL | 1 refills | Status: DC

## 2019-08-03 NOTE — Progress Notes (Signed)
Virtual Visit via Telephone Note  I connected with Sandra Herring on 08/03/19 at  1:20 PM EDT by telephone and verified that I am speaking with the correct person using two identifiers.   I discussed the limitations, risks, security and privacy concerns of performing an evaluation and management service by telephone and the availability of in person appointments. I also discussed with the patient that there may be a patient responsible charge related to this service. The patient expressed understanding and agreed to proceed.   History of Present Illness: Patient was evaluated by phone session.  She is doing better but is still have moments of depression and anxiety.  She is no longer taking trazodone because her sleep is improved however still takes Valium every night.  She is also using portable TMS device which helps her attention.  However she had skipped few times then she felt very sad depressed and unable to function.  She is not able to go back to work.  Now she is trying to get a long-term disability.  She is seeing a therapist regularly.  Recently she had a surgery for her hand and she was given pain medicine which she now finished.  Her pain is not as bad.  She denies any suicidal thoughts but is still very emotional, easy to cry and tearful.  She talks to her mother but realized that she is a difficult person to get along.  She still have vivid dreams and nightmares and sometimes they are very weird but she is able to sleep all night.  Recently she saw her primary care physician who recommended to try gabapentin and suggested to change her blood pressure medicine to propranolol.  Patient told it may help her anxiety.  We are still awaiting records from [name] who did psychological testing on her.  Patient denies drinking or using any illegal substances.  Her appetite is okay.  Her energy level is okay.     Past Psychiatric History:Reviewed. H/Odepression, anxiety and PTSD since  1994 in [place] with ex-husband. Tried Paxil, Prozac, nortriptyline, amitriptyline, Wellbutrin, Depakote,lamictal,Seroquel,Risperdal, Remeron, nefazodone and Cymbalta. Zoloft helped the most. H/Otwice inpatient because of suicidal thoughts. H/O IOP.No history of suicidal attempt.  Psychiatric Specialty Exam: Physical Exam  ROS  There were no vitals taken for this visit.There is no height or weight on file to calculate BMI.  General Appearance: NA  Eye Contact:  NA  Speech:  Clear and Coherent and Normal Rate  Volume:  Normal  Mood:  Anxious  Affect:  NA  Thought Process:  Descriptions of Associations: Intact  Orientation:  Full (Time, Place, and Person)  Thought Content:  Rumination  Suicidal Thoughts:  No  Homicidal Thoughts:  No  Memory:  Immediate;   Fair Recent;   Fair Remote;   Good  Judgement:  Fair  Insight:  Fair  Psychomotor Activity:  NA  Concentration:  Concentration: Fair and Attention Span: Fair  Recall:  Good  Fund of Knowledge:  Good  Language:  Good  Akathisia:  No  Handed:  Right  AIMS (if indicated):     Assets:  Communication Skills Desire for Improvement Housing Resilience  ADL's:  Intact  Cognition:  WNL  Sleep:   improved      Assessment and Plan: Posttraumatic stress disorder.  Major depressive disorder, recurrent.  Generalized anxiety disorder.  Rule out ADD.  Patient is no longer taking trazodone and Wellbutrin.  She did not see any improvement with the Wellbutrin.  I recommend to continue Zoloft 150 mg daily and Valium 5 mg as needed.  Encourage to continue with therapist for coping skills.  At this time patient is unable to go back to work and she should apply for long-term disability.  We will not refill her trazodone since patient is no longer taking it.  However I encouraged that she should call her primary care physician to clarify her blood pressure medicine as she is already taking metoprolol and if she decided to  take propanolol then it should be cleared from the PCP.  Patient is also hoping to start gabapentin 300 mg to help her anxiety from PCP.  Discussed medication side effects and benefits.  Recommended to call us back if she is any question or any concern.  Follow-up in 2 months.  Follow Up Instructions:    I discussed the assessment and treatment plan with the patient. The patient was provided an opportunity to ask questions and all were answered. The patient agreed with the plan and demonstrated an understanding of the instructions.   The patient was advised to call back or seek an in-person evaluation if the symptoms worsen or if the condition fails to improve as anticipated.  I provided 20 minutes of non-face-to-face time during this encounter.   [name], MD

## 2019-08-10 ENCOUNTER — Ambulatory Visit (HOSPITAL_COMMUNITY): Payer: 59 | Admitting: Psychiatry

## 2019-08-10 ENCOUNTER — Other Ambulatory Visit: Payer: Self-pay

## 2019-08-10 DIAGNOSIS — F319 Bipolar disorder, unspecified: Secondary | ICD-10-CM

## 2019-08-10 DIAGNOSIS — F4310 Post-traumatic stress disorder, unspecified: Secondary | ICD-10-CM

## 2019-08-10 NOTE — Progress Notes (Signed)
Virtual Visit via Video Note  I connected with Sandra Herring on 08/10/19 at  3:00 PM EDT by a video enabled telemedicine application and verified that I am speaking with the correct person using two identifiers.  Location: Patient: Sandra Herring Provider: Lise Auer, LCSW   I discussed the limitations of evaluation and management by telemedicine and the availability of in person appointments. The patient expressed understanding and agreed to proceed.  History of Present Illness: PTSD and Bipolar 1 DO   Observations/Objective: Counselor met with Sandra Herring for individual therapy via Webex. Counselor assessed MH symptoms and progress on treatment plan goals. Sandra Herring presented with moderate to severe depression and moderate anxiety. Sandra Herring denied suicidal ideation or self-harm behaviors. Sandra Herring shared that that she was currently experiencing a bad headache and was attempting to decrease stimulation and rest to resolve it. She stated that she would still like to have our session and would gauge her pain level throughout. Sandra Herring shared about her plan to bring her son back home from [place], sharing that she needed to cancel a previously set appointment. Counselor explored her thoughts and feelings about seeing her son again and his transition to moving in with her. Sandra Herring expressed her concerns about financial stressors and disability status. Counselor explored community resources with her to address needs. 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 Counselor identified connections between her emotional needs and her relationship with cigarettes. Sandra Herring identified losses and insecurities related.Sandra Herring would like to explore these ideas more indepth for homework, journaling about childhood impacts and influences, in addition to societal interactions and impacts have on her current behaviors, habits and functioning.    Assessment and Plan: Counselor will continue to meet with patient to address treatment plan goals. Patient will continue to follow recommendations of providers and implement skills learned in session.  Follow Up Instructions: Counselor will send information for next session via Webex.     I discussed the assessment and treatment plan with the patient. The patient was provided an opportunity to ask questions and all were answered. The patient agreed with the plan and demonstrated an understanding of the instructions.   The patient was advised to call back or seek an in-person evaluation if the symptoms worsen or if the condition fails to improve as anticipated.  I provided 60 minutes of non-face-to-face time during this encounter.   Lise Auer, LCSW

## 2019-08-12 ENCOUNTER — Encounter (HOSPITAL_COMMUNITY): Payer: Self-pay | Admitting: Psychiatry

## 2019-08-17 ENCOUNTER — Ambulatory Visit (INDEPENDENT_AMBULATORY_CARE_PROVIDER_SITE_OTHER): Payer: 59 | Admitting: Psychiatry

## 2019-08-17 ENCOUNTER — Other Ambulatory Visit: Payer: Self-pay

## 2019-08-17 DIAGNOSIS — F411 Generalized anxiety disorder: Secondary | ICD-10-CM

## 2019-08-17 DIAGNOSIS — F330 Major depressive disorder, recurrent, mild: Secondary | ICD-10-CM | POA: Diagnosis not present

## 2019-08-17 DIAGNOSIS — F319 Bipolar disorder, unspecified: Secondary | ICD-10-CM | POA: Diagnosis not present

## 2019-08-17 DIAGNOSIS — F4310 Post-traumatic stress disorder, unspecified: Secondary | ICD-10-CM | POA: Diagnosis not present

## 2019-08-17 NOTE — Progress Notes (Signed)
Virtual Visit via Video Note  I connected with Sandra Herring on 08/17/19 at  2:00 PM EDT by a video enabled telemedicine application and verified that I am speaking with the correct person using two identifiers.  Location: Patient: Sandra Herring Provider: Lise Auer, LCSW   I discussed the limitations of evaluation and management by telemedicine and the availability of in person appointments. The patient expressed understanding and agreed to proceed.  History of Present Illness: PTSD, Bipolar 1 DO, GAD and MDD   Observations/Objective: Counselor met with Sandra Herring for individual therapy via Webex. Counselor assessed MH symptoms and progress on treatment plan goals. Sandra Herring expresses loneliness hopelessness, feelings of being misunderstood, experiencing active PTSD and she presented with moderate depression and high anxiety. Sandra Herring denied suicidal ideation or self-harm behaviors. Sandra Herring shared that she was experiencing reservations about her upcoming trip to bring her son home from incarceration. Counselor explored the root causes and triggers related to her feelings of vulnerability and lack of safety. Counselor engaged Sandra Herring in CBT interventions to process her thoughts and feelings, to engage her logical brain vs her emotional brain. Sandra Herring was able to identify positive and negative aspects to her trip and living with her son again. Counselor encouraged her to thank her brain for sending her warning signals, and we discussed boundaries, safety and back up plans if she begins to be concerned in the moment in these situations. Sandra Herring shared about another triggering event related to conflicts and concerns with her neighbors, which triggered her childhood traumas. Counselor discussed coping strategies to express and explore her feelings to process the connections. Sandra Herring shared that she has been getting out and walking in the woods near her home 1.5 hours a day with her dog. She stated these are  helpful walks to connect with nature and change perspective.  Assessment and Plan: Counselor will continue to meet with patient to address treatment plan goals. Patient will continue to follow recommendations of providers and implement skills learned in session.  Follow Up Instructions: Counselor will send information for next session via Webex.     I discussed the assessment and treatment plan with the patient. The patient was provided an opportunity to ask questions and all were answered. The patient agreed with the plan and demonstrated an understanding of the instructions.   The patient was advised to call back or seek an in-person evaluation if the symptoms worsen or if the condition fails to improve as anticipated.  I provided 60 minutes of non-face-to-face time during this encounter.   Lise Auer, LCSW

## 2019-08-19 ENCOUNTER — Encounter (HOSPITAL_COMMUNITY): Payer: Self-pay | Admitting: Psychiatry

## 2019-08-24 ENCOUNTER — Ambulatory Visit (HOSPITAL_COMMUNITY): Payer: 59 | Admitting: Psychiatry

## 2019-08-31 ENCOUNTER — Other Ambulatory Visit: Payer: Self-pay

## 2019-08-31 ENCOUNTER — Encounter (HOSPITAL_COMMUNITY): Payer: Self-pay | Admitting: Psychiatry

## 2019-08-31 ENCOUNTER — Ambulatory Visit (INDEPENDENT_AMBULATORY_CARE_PROVIDER_SITE_OTHER): Payer: 59 | Admitting: Psychiatry

## 2019-08-31 DIAGNOSIS — F319 Bipolar disorder, unspecified: Secondary | ICD-10-CM | POA: Diagnosis not present

## 2019-08-31 DIAGNOSIS — F4310 Post-traumatic stress disorder, unspecified: Secondary | ICD-10-CM | POA: Diagnosis not present

## 2019-08-31 NOTE — Progress Notes (Signed)
Virtual Visit via Video Note  I connected with [name] on 08/31/19 at  3:00 PM EST by a video enabled telemedicine application and verified that I am speaking with the correct person using two identifiers.  Location: Patient: Arlene Brickel Provider: Lise Auer, LCSW   I discussed the limitations of evaluation and management by telemedicine and the availability of in person appointments. The patient expressed understanding and agreed to proceed.  History of Present Illness: PTSD and MDD   Observations/Objective: Counselor met with [name] for individual therapy via Webex. Counselor assessed MH symptoms and progress on treatment plan goals. Tonika presented with severe depression and severe anxiety, presentation of mixed emotions and affect, tearful.  Kathelene denied suicidal ideation or self-harm behaviors. Marisela shared that she experienced extreme physical and emotional challenges on her trip to [place] to bring her son home upon his release from prison. Counselor explored challenges, current stressors and functioning. Counselor highlighted her strengths and accomplishments, as well as, validating her feelings. Counselor used cognitive coping interventions to evaluate cognitive distortions and negative self-talk. Jinx is highly concerned and very anxious about her lack of basic needs and resources. She stated that she has contacted local agencies for assistance, but is having a difficult time completing the paperwork due to her depressive symptoms and physical health problems. She is in need of long term disability due to her current functioning and inability to return to work. Counselor and Leeya explore trauma triggers and responses. Counselor discussed boundary setting, assertive communication skills and self-care.   Assessment and Plan: Counselor will continue to meet with patient to address treatment plan goals. Patient will continue to follow recommendations of providers and  implement skills learned in session.  Follow Up Instructions: Counselor will send information for next session via Webex.     I discussed the assessment and treatment plan with the patient. The patient was provided an opportunity to ask questions and all were answered. The patient agreed with the plan and demonstrated an understanding of the instructions.   The patient was advised to call back or seek an in-person evaluation if the symptoms worsen or if the condition fails to improve as anticipated.  I provided 60 minutes of non-face-to-face time during this encounter.   Lise Auer, LCSW

## 2019-09-08 ENCOUNTER — Other Ambulatory Visit: Payer: Self-pay

## 2019-09-08 ENCOUNTER — Encounter (HOSPITAL_COMMUNITY): Payer: Self-pay | Admitting: Psychiatry

## 2019-09-08 ENCOUNTER — Ambulatory Visit (INDEPENDENT_AMBULATORY_CARE_PROVIDER_SITE_OTHER): Payer: 59 | Admitting: Psychiatry

## 2019-09-08 DIAGNOSIS — F330 Major depressive disorder, recurrent, mild: Secondary | ICD-10-CM

## 2019-09-08 DIAGNOSIS — F4310 Post-traumatic stress disorder, unspecified: Secondary | ICD-10-CM | POA: Diagnosis not present

## 2019-09-08 NOTE — Progress Notes (Signed)
Virtual Visit via Video Note  I connected with [name] on 09/08/19 at  4:00 PM EST by a video enabled telemedicine application and verified that I am speaking with the correct person using two identifiers.  Location: Patient: Sandra Herring Provider: Lise Auer, LCSW   I discussed the limitations of evaluation and management by telemedicine and the availability of in person appointments. The patient expressed understanding and agreed to proceed.  History of Present Illness: MDD and PTSD   Observations/Objective: Counselor met with [name] for individual therapy via Webex. Counselor assessed MH symptoms and progress on treatment plan goals. Makenze presents with severe depression and high anxiety. Marcela denied suicidal ideation or self-harm behaviors. Jamira shared that her dog and companion of 12 years traumatically passed away on 01/13/2023. Counselor processed the traumatic event, explored the grief and loss and allowed [name] to have an outlet for her emotions. Counselor shared psychoeducation about the stages of grief and loss and we identified ways she can Herbalist and celebrate their life together. [name] was able to identify coping skills and strategies, as well as connecting with her support system to assist in the grief and loss process. [name] continues to be concerned about financial state and basic needs. She is experiencing a depressive episode, which is making it difficult for her to follow up with and complete applications/calls for resources. [name] plans to request support in this area from her support system.   Assessment and Plan: Counselor will continue to meet with patient to address treatment plan goals. Patient will continue to follow recommendations of providers and implement skills learned in session.  Follow Up Instructions: Counselor will send information for next session via Webex.     I discussed the assessment and treatment plan with the patient.  The patient was provided an opportunity to ask questions and all were answered. The patient agreed with the plan and demonstrated an understanding of the instructions.   The patient was advised to call back or seek an in-person evaluation if the symptoms worsen or if the condition fails to improve as anticipated.  I provided 60 minutes of non-face-to-face time during this encounter.   Lise Auer, LCSW

## 2019-09-19 ENCOUNTER — Other Ambulatory Visit (HOSPITAL_COMMUNITY): Payer: Self-pay | Admitting: Psychiatry

## 2019-09-19 DIAGNOSIS — F411 Generalized anxiety disorder: Secondary | ICD-10-CM

## 2019-09-19 DIAGNOSIS — F330 Major depressive disorder, recurrent, mild: Secondary | ICD-10-CM

## 2019-09-19 DIAGNOSIS — F4310 Post-traumatic stress disorder, unspecified: Secondary | ICD-10-CM

## 2019-09-29 ENCOUNTER — Encounter (HOSPITAL_COMMUNITY): Payer: Self-pay | Admitting: Psychiatry

## 2019-09-29 ENCOUNTER — Ambulatory Visit (INDEPENDENT_AMBULATORY_CARE_PROVIDER_SITE_OTHER): Payer: 59 | Admitting: Psychiatry

## 2019-09-29 ENCOUNTER — Other Ambulatory Visit: Payer: Self-pay

## 2019-09-29 DIAGNOSIS — F330 Major depressive disorder, recurrent, mild: Secondary | ICD-10-CM

## 2019-09-29 DIAGNOSIS — F4310 Post-traumatic stress disorder, unspecified: Secondary | ICD-10-CM

## 2019-09-29 NOTE — Progress Notes (Signed)
Virtual Visit via Video Note  I connected with Sandra Herring on 09/29/19 at  1:00 PM EST by a video enabled telemedicine application and verified that I am speaking with the correct person using two identifiers.  Location: Patient: Sandra Herring Provider: Lise Auer, LCSW   I discussed the limitations of evaluation and management by telemedicine and the availability of in person appointments. The patient expressed understanding and agreed to proceed.  History of Present Illness: MDD and PTSD   Observations/Objective: Counselor met with Sandra Herring for individual therapy via Webex. Counselor assessed MH symptoms and progress on treatment plan goals. Sandra Herring presents with moderate depression and high anxiety. Sandra Herring denied suicidal ideation or self-harm behaviors. Sandra Herring about benefits and challenges of cohabitating with her son and his friend. Counselor assessed daily functioning with Maegen identifying that her overall functioning has improved, listing a variety of positive aspects. Counselor assessed impact of challenges related to boundaries, communication skills, self-care and meeting basic needs. Counselor prompted homework of holding a "house meeting" to lay out expectations and ground rules for interactions. Sandra Herring discussed strategies in implementing the plan and role played conversations. Sandra Herring identified that she has noted improvements in mood, decreased guilt, self-loathing, and shame.   Assessment and Plan: Counselor will continue to meet with patient to address treatment plan goals. Patient will continue to follow recommendations of providers and implement skills learned in session.  Follow Up Instructions: Counselor will send information for next session via Webex.     I discussed the assessment and treatment plan with the patient. The patient was provided an opportunity to ask questions and all were answered. The patient agreed with the plan and demonstrated an  understanding of the instructions.   The patient was advised to call back or seek an in-person evaluation if the symptoms worsen or if the condition fails to improve as anticipated.  I provided 60 minutes of non-face-to-face time during this encounter.   Lise Auer, LCSW

## 2019-10-03 ENCOUNTER — Encounter (HOSPITAL_COMMUNITY): Payer: Self-pay | Admitting: Psychiatry

## 2019-10-03 ENCOUNTER — Other Ambulatory Visit: Payer: Self-pay

## 2019-10-03 ENCOUNTER — Ambulatory Visit (INDEPENDENT_AMBULATORY_CARE_PROVIDER_SITE_OTHER): Payer: Self-pay | Admitting: Psychiatry

## 2019-10-03 DIAGNOSIS — F330 Major depressive disorder, recurrent, mild: Secondary | ICD-10-CM

## 2019-10-03 DIAGNOSIS — F411 Generalized anxiety disorder: Secondary | ICD-10-CM

## 2019-10-03 DIAGNOSIS — F4310 Post-traumatic stress disorder, unspecified: Secondary | ICD-10-CM

## 2019-10-03 MED ORDER — DIAZEPAM 5 MG PO TABS: ORAL_TABLET | ORAL | 2 refills | Status: DC

## 2019-10-03 MED ORDER — SERTRALINE HCL 100 MG PO TABS: 150.0000 mg | ORAL_TABLET | Freq: Every day | ORAL | 0 refills | Status: DC

## 2019-10-03 NOTE — Progress Notes (Signed)
Virtual Visit via Telephone Note  I connected with Sandra Herring on 10/03/19 at 11:40 AM EST by telephone and verified that I am speaking with the correct person using two identifiers.   I discussed the limitations, risks, security and privacy concerns of performing an evaluation and management service by telephone and the availability of in person appointments. I also discussed with the patient that there may be a patient responsible charge related to this service. The patient expressed understanding and agreed to proceed.   History of Present Illness: Patient was evaluated by phone session.  She tried to cut down her Valium to 2.5 but she started to have a lot of anxiety.  She is taking now 5 mg and that is helping her sleep and anxiety.  She is also very pleased because her son came from [place] after serving time.  He is in [employer].  Patient went request to bring him back and he is very happy about it.  Her Thanksgiving was very good.  She is now more focus for healthy eating and making meals for her son.  She is in therapy with [name] and that is going well.  Sometimes she has vivid dreams and nightmares but she is able to sleep much better.  She is no longer taking gabapentin and very rarely takes trazodone.  She denies any crying spells or any feeling of hopelessness or worthlessness.  She denies any paranoia or any hallucination.  She has no tremors shakes or any EPS.  She like to continue her current medication.  Past Psychiatric History:Reviewed. H/Odepression, anxiety and PTSD since 1994 in [place] with ex-husband. Tried Paxil, Prozac, nortriptyline, amitriptyline, Wellbutrin, Depakote,lamictal,Seroquel,Risperdal, Remeron, nefazodone and Cymbalta. Zoloft helped the most. H/Otwice inpatient because of suicidal thoughts. H/O IOP.No history of suicidal attempt.    Psychiatric Specialty Exam: Physical Exam  ROS  There were no vitals taken for this visit.There  is no height or weight on file to calculate BMI.  General Appearance: NA  Eye Contact:  NA  Speech:  Clear and Coherent  Volume:  Normal  Mood:  Euthymic  Affect:  NA  Thought Process:  Goal Directed  Orientation:  Full (Time, Place, and Person)  Thought Content:  WDL  Suicidal Thoughts:  No  Homicidal Thoughts:  No  Memory:  Immediate;   Fair Recent;   Good Remote;   Good  Judgement:  Good  Insight:  Good  Psychomotor Activity:  NA  Concentration:  Concentration: Good and Attention Span: Fair  Recall:  Good  Fund of Knowledge:  Good  Language:  Good  Akathisia:  No  Handed:  Right  AIMS (if indicated):     Assets:  Communication Skills Desire for Improvement Housing Resilience  ADL's:  Intact  Cognition:  WNL  Sleep:   good      Assessment and Plan: Posttraumatic stress disorder.  Major depressive disorder, recurrent.  Generalized anxiety disorder.  Patient is a stable on her current medication.  She like to continue her current meds which are Zoloft 150 mg daily and Valium 5 mg at bedtime.  She tried to cut down her Valium but her anxiety come back.  Discussed medication side effects and benefits.  Recommended to call us back if she has any question or any concern.  Continue therapy with Bethany.  Follow-up in 3 months.  Follow Up Instructions:    I discussed the assessment and treatment plan with the patient. The patient was provided an opportunity to  ask questions and all were answered. The patient agreed with the plan and demonstrated an understanding of the instructions.   The patient was advised to call back or seek an in-person evaluation if the symptoms worsen or if the condition fails to improve as anticipated.  I provided 20 minutes of non-face-to-face time during this encounter.   Kathlee Nations, MD

## 2019-10-05 ENCOUNTER — Other Ambulatory Visit: Payer: Self-pay

## 2019-10-05 ENCOUNTER — Ambulatory Visit (INDEPENDENT_AMBULATORY_CARE_PROVIDER_SITE_OTHER): Payer: 59 | Admitting: Psychiatry

## 2019-10-05 ENCOUNTER — Encounter (HOSPITAL_COMMUNITY): Payer: Self-pay | Admitting: Psychiatry

## 2019-10-05 DIAGNOSIS — F330 Major depressive disorder, recurrent, mild: Secondary | ICD-10-CM

## 2019-10-05 DIAGNOSIS — F411 Generalized anxiety disorder: Secondary | ICD-10-CM

## 2019-10-05 DIAGNOSIS — F4310 Post-traumatic stress disorder, unspecified: Secondary | ICD-10-CM

## 2019-10-05 NOTE — Progress Notes (Signed)
Virtual Visit via Video Note  I connected with Sandra Herring on 10/05/19 at  2:00 PM EST by a video enabled telemedicine application and verified that I am speaking with the correct person using two identifiers.  Location: Patient: Sandra Herring Provider: Lise Auer, LCSW   I discussed the limitations of evaluation and management by telemedicine and the availability of in person appointments. The patient expressed understanding and agreed to proceed.  History of Present Illness: MDD, GAD and PTSD   Observations/Objective: Counselor met with Sandra Herring for individual therapy via Webex. Counselor assessed MH symptoms and progress on treatment plan goals.Sandra Herring presents with moderate depression and moderate anxiety. Sandra Herring denied suicidal ideation or self-harm behaviors. Sandra Herring about application of coping strategies and the positive impacts of her communication of boundaries and needs. Counselor praised Sandra Herring for her willingness and bravery to communicate differently and for applying skills discussed in last session. Counselor and Sandra Herring discussed Architectural technologist of boundaries discussed with her son and housemate. Counselor used psychotherapeutic interventions to address application of coping strategies. Sandra Herring was able to identify progress towards goals and the fruition of the therapeutic work she has accomplished over the past 6 months.   Assessment and Plan: Counselor will continue to meet with patient to address treatment plan goals. Patient will continue to follow recommendations of providers and implement skills learned in session.  Follow Up Instructions: Counselor will send information for next session via Webex.     I discussed the assessment and treatment plan with the patient. The patient was provided an opportunity to ask questions and all were answered. The patient agreed with the plan and demonstrated an understanding of the instructions.   The patient was  advised to call back or seek an in-person evaluation if the symptoms worsen or if the condition fails to improve as anticipated.  I provided 60 minutes of non-face-to-face time during this encounter.   Lise Auer, LCSW

## 2019-10-12 ENCOUNTER — Ambulatory Visit (INDEPENDENT_AMBULATORY_CARE_PROVIDER_SITE_OTHER): Payer: Self-pay | Admitting: Psychiatry

## 2019-10-12 ENCOUNTER — Encounter (HOSPITAL_COMMUNITY): Payer: Self-pay | Admitting: Psychiatry

## 2019-10-12 ENCOUNTER — Other Ambulatory Visit: Payer: Self-pay

## 2019-10-12 DIAGNOSIS — F330 Major depressive disorder, recurrent, mild: Secondary | ICD-10-CM

## 2019-10-12 DIAGNOSIS — F411 Generalized anxiety disorder: Secondary | ICD-10-CM

## 2019-10-12 NOTE — Progress Notes (Signed)
Virtual Visit via Video Note  I connected with Sandra Herring on 10/12/19 at  3:00 PM EST by a video enabled telemedicine application and verified that I am speaking with the correct person using two identifiers.  Location: Patient: Sandra Herring Provider: Lise Auer, LCSW   I discussed the limitations of evaluation and management by telemedicine and the availability of in person appointments. The patient expressed understanding and agreed to proceed.  History of Present Illness: MDD and GAD   Observations/Objective: Counselor met with Sandra Herring for individual therapy via Webex. Counselor assessed MH symptoms and progress on treatment plan goals. Sandra Herring presented with high anxiety and mild depression. Sandra Herring denied suicidal ideation or self-harm behaviors. Counselor assessed daily functioning, with Sandra Herring sharing that overall there are improvements in that area, now that she is residing with others. Sandra Herring continues to have financial stressors and is working to resolve. Counselor and Sandra Herring engaged in interventions for assertive communication, boundary setting and cognitive copings. Sandra Herring responded well to interventions and made a plan for application of skills learned and discussed.   Assessment and Plan: Counselor will continue to meet with patient to address treatment plan goals. Patient will continue to follow recommendations of providers and implement skills learned in session.  Follow Up Instructions: Counselor will send information for next session via Webex.     I discussed the assessment and treatment plan with the patient. The patient was provided an opportunity to ask questions and all were answered. The patient agreed with the plan and demonstrated an understanding of the instructions.   The patient was advised to call back or seek an in-person evaluation if the symptoms worsen or if the condition fails to improve as anticipated.  I provided 60 minutes of  non-face-to-face time during this encounter.   Lise Auer, LCSW

## 2019-10-19 ENCOUNTER — Ambulatory Visit (HOSPITAL_COMMUNITY): Payer: 59 | Admitting: Psychiatry

## 2019-10-19 ENCOUNTER — Other Ambulatory Visit: Payer: Self-pay

## 2019-10-19 NOTE — Progress Notes (Signed)
no

## 2019-10-26 ENCOUNTER — Other Ambulatory Visit: Payer: Self-pay

## 2019-10-26 ENCOUNTER — Ambulatory Visit (INDEPENDENT_AMBULATORY_CARE_PROVIDER_SITE_OTHER): Payer: Self-pay | Admitting: Psychiatry

## 2019-10-26 ENCOUNTER — Encounter (HOSPITAL_COMMUNITY): Payer: Self-pay | Admitting: Psychiatry

## 2019-10-26 DIAGNOSIS — F330 Major depressive disorder, recurrent, mild: Secondary | ICD-10-CM

## 2019-10-26 DIAGNOSIS — F411 Generalized anxiety disorder: Secondary | ICD-10-CM

## 2019-10-26 DIAGNOSIS — F4310 Post-traumatic stress disorder, unspecified: Secondary | ICD-10-CM

## 2019-10-26 NOTE — Progress Notes (Signed)
Virtual Visit via Video Note  I connected with [name] on 10/26/19 at  2:00 PM EST by a video enabled telemedicine application and verified that I am speaking with the correct person using two identifiers.  Location: Patient: Sandra Herring Provider: Lise Auer, LCSW   I discussed the limitations of evaluation and management by telemedicine and the availability of in person appointments. The patient expressed understanding and agreed to proceed.  History of Present Illness: MDD, GAD and PTSD   Observations/Objective: Counselor met with [name] for individual therapy via Webex. Counselor assessed MH symptoms and progress on treatment plan goals. Kayanna presents with moderate depression and moderate anxiety. Marcille denied suicidal ideation or self-harm behaviors. Alliyah shared that she is continuing to be challenged in her parental role of cohabitating with her adult son. Counselor explored dynamics, with Deshia successfully able to identify healthy boundary setting, communication of needs and expectations, and managing emotions in the moment. Counselor and [name] processed trauma triggers associated with dynamics. Counselor and Kandice able to identify and celebrate progress in treatment plan goals, as well as personal goals, in relation to her management of mental health and caregiver capacity. Ahlam continues to have difficulty with finances and meeting basic needs. [name] utilizing Statistician as a coping mechanism. [name] requested support/assistance in sharing grievances/concerns with former employer. Counselor to process with supervisor about guidelines. Zuzu expressed gratitude for services provider by Counselor.   Assessment and Plan: Counselor will continue to meet with patient to address treatment plan goals. Patient will continue to follow recommendations of providers and implement skills learned in session.  Follow Up Instructions: Counselor will  send information for next session via Webex.     I discussed the assessment and treatment plan with the patient. The patient was provided an opportunity to ask questions and all were answered. The patient agreed with the plan and demonstrated an understanding of the instructions.   The patient was advised to call back or seek an in-person evaluation if the symptoms worsen or if the condition fails to improve as anticipated.  I provided 60 minutes of non-face-to-face time during this encounter.   Lise Auer, LCSW

## 2019-10-31 ENCOUNTER — Other Ambulatory Visit (HOSPITAL_COMMUNITY): Payer: Self-pay | Admitting: Psychiatry

## 2019-10-31 DIAGNOSIS — F4310 Post-traumatic stress disorder, unspecified: Secondary | ICD-10-CM

## 2019-10-31 DIAGNOSIS — F411 Generalized anxiety disorder: Secondary | ICD-10-CM

## 2019-11-08 ENCOUNTER — Ambulatory Visit (INDEPENDENT_AMBULATORY_CARE_PROVIDER_SITE_OTHER): Payer: Self-pay | Admitting: Psychiatry

## 2019-11-08 ENCOUNTER — Other Ambulatory Visit: Payer: Self-pay

## 2019-11-08 DIAGNOSIS — F319 Bipolar disorder, unspecified: Secondary | ICD-10-CM

## 2019-11-08 DIAGNOSIS — F4310 Post-traumatic stress disorder, unspecified: Secondary | ICD-10-CM

## 2019-11-08 DIAGNOSIS — F411 Generalized anxiety disorder: Secondary | ICD-10-CM
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
## 2019-11-09 ENCOUNTER — Telehealth (HOSPITAL_COMMUNITY): Payer: Self-pay | Admitting: Psychiatry

## 2019-11-09 ENCOUNTER — Encounter (HOSPITAL_COMMUNITY): Payer: Self-pay | Admitting: Psychiatry

## 2019-11-17 NOTE — Telephone Encounter (Signed)
I called patient as patient left a message that she is trying to reach me.  Patient told that she is actually trying to reach her therapist Toma Copier through email but not able to go through.  She admitted having a hard time due to family issues.  She like to discuss and talk to her therapist.  I will forward her message to her therapist.

## 2019-11-21 ENCOUNTER — Ambulatory Visit (INDEPENDENT_AMBULATORY_CARE_PROVIDER_SITE_OTHER): Payer: Self-pay | Admitting: Psychiatry

## 2019-11-21 ENCOUNTER — Emergency Department (HOSPITAL_COMMUNITY): Payer: Self-pay

## 2019-11-21 ENCOUNTER — Encounter (HOSPITAL_COMMUNITY): Payer: Self-pay | Admitting: Psychiatry

## 2019-11-21 ENCOUNTER — Emergency Department (HOSPITAL_COMMUNITY)
Admission: EM | Admit: 2019-11-21 | Discharge: 2019-11-21 | Disposition: A | Discharge status: Home / Self Care | Payer: Self-pay | Attending: Emergency Medicine | Admitting: Emergency Medicine

## 2019-11-21 ENCOUNTER — Other Ambulatory Visit: Payer: Self-pay

## 2019-11-21 ENCOUNTER — Encounter (HOSPITAL_COMMUNITY): Payer: Self-pay | Admitting: Emergency Medicine

## 2019-11-21 DIAGNOSIS — F419 Anxiety disorder, unspecified: Secondary | ICD-10-CM | POA: Insufficient documentation

## 2019-11-21 DIAGNOSIS — R0789 Other chest pain: Secondary | ICD-10-CM | POA: Insufficient documentation

## 2019-11-21 DIAGNOSIS — F410 Panic disorder [episodic paroxysmal anxiety] without agoraphobia: Secondary | ICD-10-CM

## 2019-11-21 DIAGNOSIS — Z79899 Other long term (current) drug therapy: Secondary | ICD-10-CM | POA: Insufficient documentation

## 2019-11-21 DIAGNOSIS — F4310 Post-traumatic stress disorder, unspecified: Secondary | ICD-10-CM

## 2019-11-21 DIAGNOSIS — F17210 Nicotine dependence, cigarettes, uncomplicated: Secondary | ICD-10-CM | POA: Insufficient documentation

## 2019-11-21 DIAGNOSIS — F411 Generalized anxiety disorder: Secondary | ICD-10-CM

## 2019-11-21 LAB — CBC
HCT: 47.8 % — ABNORMAL HIGH (ref 36.0–46.0)
Hemoglobin: 15.7 g/dL — ABNORMAL HIGH (ref 12.0–15.0)
MCH: 28.5 pg (ref 26.0–34.0)
MCHC: 32.8 g/dL (ref 30.0–36.0)
MCV: 86.9 fL (ref 80.0–100.0)
Platelets: 318 10*3/uL (ref 150–400)
RBC: 5.5 MIL/uL — ABNORMAL HIGH (ref 3.87–5.11)
RDW: 13 % (ref 11.5–15.5)
WBC: 7 10*3/uL (ref 4.0–10.5)
nRBC: 0 % (ref 0.0–0.2)

## 2019-11-21 LAB — BASIC METABOLIC PANEL
Anion gap: 13 (ref 5–15)
BUN: 11 mg/dL (ref 6–20)
CO2: 25 mmol/L (ref 22–32)
Calcium: 9.6 mg/dL (ref 8.9–10.3)
Chloride: 103 mmol/L (ref 98–111)
Creatinine, Ser: 0.89 mg/dL (ref 0.44–1.00)
GFR calc Af Amer: >60 mL/min (ref >60)
GFR calc non Af Amer: >60 mL/min (ref >60)
Glucose, Bld: 120 mg/dL — ABNORMAL HIGH (ref 70–99)
Potassium: 4.1 mmol/L (ref 3.5–5.1)
Sodium: 141 mmol/L (ref 135–145)

## 2019-11-21 LAB — TROPONIN I (HIGH SENSITIVITY)
Troponin I (High Sensitivity): <2 ng/L (ref <18)
Troponin I (High Sensitivity): <2 ng/L (ref <18)

## 2019-11-21 LAB — I-STAT BETA HCG BLOOD, ED (MC, WL, AP ONLY): I-stat hCG, quantitative: <5 m[IU]/mL (ref <5)

## 2019-11-21 MED ORDER — ONDANSETRON HCL 4 MG/2ML IJ SOLN
4.0000 mg | Freq: Once | INTRAMUSCULAR | Status: AC
  Administered 2019-11-21: 4 mg via INTRAVENOUS
  Filled 2019-11-21: qty 2
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
## 2019-11-21 NOTE — ED Notes (Signed)
X-ray at bedside
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
## 2019-11-21 NOTE — Discharge Instructions (Signed)
Your lab tests, ekg and chest xray today is reassuring with no evidence of your symptoms being from angina or a heart attack.  I suspect your symptoms are however from your increased home stressors.  Keep in close contact with your therapist and Dr. Lolly Mustache.  I also recommend smoking cessation as discussed.
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
## 2019-11-21 NOTE — ED Notes (Signed)
Pt ambulated to the bathroom with a steady gait.

## 2019-11-30 ENCOUNTER — Encounter (HOSPITAL_COMMUNITY): Payer: Self-pay | Admitting: Psychiatry

## 2019-11-30 ENCOUNTER — Telehealth (HOSPITAL_COMMUNITY): Payer: Self-pay | Admitting: Physician Assistant

## 2019-11-30 ENCOUNTER — Other Ambulatory Visit: Payer: Self-pay

## 2019-11-30 ENCOUNTER — Telehealth (HOSPITAL_COMMUNITY): Payer: Self-pay | Admitting: Psychiatry

## 2019-11-30 ENCOUNTER — Ambulatory Visit (INDEPENDENT_AMBULATORY_CARE_PROVIDER_SITE_OTHER): Payer: Self-pay | Admitting: Psychiatry

## 2019-11-30 DIAGNOSIS — F4310 Post-traumatic stress disorder, unspecified: Secondary | ICD-10-CM

## 2019-11-30 DIAGNOSIS — F411 Generalized anxiety disorder: Secondary | ICD-10-CM

## 2019-11-30 NOTE — Telephone Encounter (Signed)
11/30/19 11:55am  Called and spoke with patient due to no insurance on file - per patient she thought she was covered until March - I asked the patient which insurance so I could put in the system and she wouldn't say - the only thing she said is that she would call - advised her once she find out to give [place] a call so that way we could least see if the insurance would pay some of her bill.  When I tried to tell her the amount pending she state"I don't wan to know it's in God's hand it would only make me depress".  Patient is aware at this time she is self-pay because she told me she has no insurance.Marland KitchenMarguerite Olea

## 2019-11-30 NOTE — Progress Notes (Signed)
Virtual Visit via Video Note  I connected with Sandra Herring on 11/30/19 at  4:00 PM EST by a video enabled telemedicine application and verified that I am speaking with the correct person using two identifiers.  Location: Patient: Patient Home Provider: Home Office   I discussed the limitations of evaluation and management by telemedicine and the availability of in person appointments. The patient expressed understanding and agreed to proceed.  History of Present Illness: MDD and PTSD   Treatment Plan Goals: "Continue working on application of skills learned in PHP and IOP group treatment. Processing impact of childhood traumas and adverse life experiences. "  Observations/Objective: Counselor met with Sandra Herring for individual therapy via Webex. Counselor assessed MH symptoms and progress on treatment plan goals, with patient reporting continuation of trauma triggers and responses in relation to her son's active addition. Sandra Herring presents with moderate depression and moderate anxiety. Sandra Herring denied suicidal ideation or self-harm behaviors.   Sandra Herring shared that she is noting improvements in her chest/breat pain after following treatment recommendations from medical providers and implementing coping and relaxation skills. Counselor processed the impact of son's addiction behaviors on her mental health. Sandra Herring shared that her son will start residential addition treatment out of town starting tomorrow, which causes her to be hopeful and has alleviated some anxiety symptoms. Counselor and Sandra Herring discussed concept of codependency and traced back learned behavior from childhood traumas and caregiver example. Counselor provided Sandra Herring with contact information for a Codefendants Support Group that she would like to engage in while he is in treatment to improve relationship dynamics. Counselor and Sandra Herring discussed locus of control aspects, focusing on what she can control and influence and how to accept  things she cannot. Sandra Herring identified moments of happiness and fulfillment over the past week that she hasn't experienced in years. Counselor prompted self-care and safety measures to to followed for her well-being.   Assessment and Plan: Counselor will continue to meet with patient to address treatment plan goals. Patient will continue to follow recommendations of providers and implement skills learned in session.  Follow Up Instructions: Counselor will send information for next session via Webex.    The patient was advised to call back or seek an in-person evaluation if the symptoms worsen or if the condition fails to improve as anticipated.  I provided 55 minutes of non-face-to-face time during this encounter.   Lise Auer, LCSW

## 2019-12-02 ENCOUNTER — Other Ambulatory Visit (HOSPITAL_COMMUNITY): Payer: Self-pay | Admitting: Psychiatry

## 2019-12-02 DIAGNOSIS — F411 Generalized anxiety disorder: Secondary | ICD-10-CM

## 2019-12-02 DIAGNOSIS — F4310 Post-traumatic stress disorder, unspecified: Secondary | ICD-10-CM

## 2019-12-02 DIAGNOSIS — F330 Major depressive disorder, recurrent, mild: Secondary | ICD-10-CM

## 2019-12-08 ENCOUNTER — Ambulatory Visit (INDEPENDENT_AMBULATORY_CARE_PROVIDER_SITE_OTHER): Payer: Self-pay | Admitting: Psychiatry

## 2019-12-08 ENCOUNTER — Encounter (HOSPITAL_COMMUNITY): Payer: Self-pay | Admitting: Psychiatry

## 2019-12-08 ENCOUNTER — Other Ambulatory Visit: Payer: Self-pay

## 2019-12-08 DIAGNOSIS — F411 Generalized anxiety disorder: Secondary | ICD-10-CM

## 2019-12-08 DIAGNOSIS — F4310 Post-traumatic stress disorder, unspecified: Secondary | ICD-10-CM

## 2019-12-08 NOTE — Progress Notes (Signed)
Virtual Visit via Video Note  I connected with Sandra Herring on 12/08/19 at  2:00 PM EST by a video enabled telemedicine application and verified that I am speaking with the correct person using two identifiers.  Location: Patient: Patient Home Provider: Home Office   I discussed the limitations of evaluation and management by telemedicine and the availability of in person appointments. The patient expressed understanding and agreed to proceed.  History of Present Illness: MDD and PTSD   Treatment Plan Goals: "Continue working on application of skills learned in PHP and IOP group treatment. Processing impact of childhood traumas and adverse life experiences. "  Observations/Objective: Counselor met with [name] for individual therapy via Webex. 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 [name] denied suicidal ideation or self-harm behaviors.   Keeya shared that she received news that her son was returning from rehab tomorrow. Counselor processed thoughts, feelings and impact of his return, addressing recent trauma responses from their interactions. Counselor reviewed goals of boundary setting, setting expectations and addressing communication challenges. Counselor assessed daily functioning and self-care routine, with Kashara sharing a variety of creative and physical outlets she has used to reconnect with herself and her interests in his absence from the home. Counselor and Gwendlyn worked on discharge planning in light of Counselor's upcoming leave to assess preferences and needs. Counselor recommends EMDR and trauma related evidenced based practices for Fahmida. Eliyanna shared about a reset therapy she is looking into, as well as a trauma workshop/seminar series she plans to attend. Keishawna expressed financial  concerns with ongoing treatment. Counselor to provide therapy options that meet financial needs. Counselor to send options for Gwenneth to contact between now and next session, to discuss before discharge/transferring care.   Assessment and Plan: Counselor will continue to meet with patient to address treatment plan goals. Patient will continue to follow recommendations of providers and implement skills learned in session.  Follow Up Instructions: Counselor will send information for next session via Webex.    The patient was advised to call back or seek an in-person evaluation if the symptoms worsen or if the condition fails to improve as anticipated.  I provided 55 minutes of non-face-to-face time during this encounter.   Lise Auer, LCSW

## 2019-12-11 ENCOUNTER — Encounter (HOSPITAL_COMMUNITY): Payer: Self-pay | Admitting: Psychiatry

## 2019-12-21 ENCOUNTER — Ambulatory Visit (INDEPENDENT_AMBULATORY_CARE_PROVIDER_SITE_OTHER): Payer: Self-pay | Admitting: Psychiatry

## 2019-12-21 ENCOUNTER — Encounter (HOSPITAL_COMMUNITY): Payer: Self-pay | Admitting: Psychiatry

## 2019-12-21 ENCOUNTER — Other Ambulatory Visit: Payer: Self-pay

## 2019-12-21 DIAGNOSIS — F4310 Post-traumatic stress disorder, unspecified: Secondary | ICD-10-CM

## 2019-12-21 DIAGNOSIS — F411 Generalized anxiety disorder: Secondary | ICD-10-CM

## 2019-12-21 NOTE — Progress Notes (Signed)
Virtual Visit via Telephone Note  I connected with Sandra Herring on 12/21/19 at  3:00 PM EST by telephone and verified that I am speaking with the correct person using two identifiers.  Location: Patient: Patient Home Provider: Home Office   I discussed the limitations of evaluation and management by telemedicine and the availability of in person appointments. The patient expressed understanding and agreed to proceed.  History of Present Illness: MDD and PTSD  Treatment Plan Goals: "Continue working onapplication ofskills learned in PHP and IOP group treatment. Processing impact of childhood traumas and adverse life experiences."  Observations/Objective: Counselor met with Sandra Herring for individual therapy via Webex. Counselor assessed MH symptoms and progress on treatment plan goals, with patient reporting regression in application of skills due to significant grief response to loosing her pet over the last week. Sandra Herring presents with moderate depression and moderate anxiety. Sandra Herring denied suicidal ideation or self-harm behaviors.   Sandra Herring shared that this past week she lost her pet cat, who serves as an Chief Technology Officer for her. Counselor processed this grief and loss, as it is compounded with the passing of her dog only 3 months ago. Sandra Herring was extremely distraught and expressed hopelessness and helplessness related to finding her pet. Counselor discussed coping strategies to attend to grief, implement self-care and boundaries with her adult son, who is causing additional trauma triggers. Sandra Herring processed thoughts and feelings well in session and identified practical application of skills and strategies.   Sandra Herring noted that she was unable to confirm an appointment with a provider for continuity of care, due to current functioning and life stressors. She stated that she was aware of a former therapist she can contact and will also review the list provided by the Counselor. Sandra Herring  had difficulty with ending session, as she stated being unprepared and unwilling to pause therapy with Counselor while on temporary leave. Counselor spent time helping Sandra Herring to visualize and rationalize transition to former therapist and discussed plan to reconnect after leave to assess current therapeutic needs. Counselor reviewed recommendations and resources before ending session.  Assessment and Plan: New Counselor will continue to meet with patient to address treatment plan goals. Patient will continue to follow recommendations of providers and implement skills learned in session.  Follow Up Instructions: New Counselor will send information for next session via Webex.    The patient was advised to call back or seek an in-person evaluation if the symptoms worsen or if the condition fails to improve as anticipated.  I provided 55 minutes of non-face-to-face time during this encounter.   Lise Auer, LCSW

## 2020-01-02 ENCOUNTER — Encounter (HOSPITAL_COMMUNITY): Payer: Self-pay | Admitting: Psychiatry

## 2020-01-02 ENCOUNTER — Ambulatory Visit (INDEPENDENT_AMBULATORY_CARE_PROVIDER_SITE_OTHER): Payer: Self-pay | Admitting: Psychiatry

## 2020-01-02 ENCOUNTER — Other Ambulatory Visit: Payer: Self-pay

## 2020-01-02 DIAGNOSIS — F4310 Post-traumatic stress disorder, unspecified: Secondary | ICD-10-CM

## 2020-01-02 DIAGNOSIS — F330 Major depressive disorder, recurrent, mild: Secondary | ICD-10-CM

## 2020-01-02 DIAGNOSIS — F411 Generalized anxiety disorder: Secondary | ICD-10-CM

## 2020-01-02 MED ORDER — SERTRALINE HCL 100 MG PO TABS: 150.0000 mg | ORAL_TABLET | Freq: Every day | ORAL | 0 refills | Status: DC

## 2020-01-02 MED ORDER — DIAZEPAM 5 MG PO TABS: ORAL_TABLET | ORAL | 2 refills | Status: DC

## 2020-01-02 NOTE — Progress Notes (Signed)
Virtual Visit via Telephone Note  I connected with [name] on 01/02/20 at 10:00 AM EST by telephone and verified that I am speaking with the correct person using two identifiers.   I discussed the limitations, risks, security and privacy concerns of performing an evaluation and management service by telephone and the availability of in person appointments. I also discussed with the patient that there may be a patient responsible charge related to this service. The patient expressed understanding and agreed to proceed.   History of Present Illness: Patient was evaluated by phone session.  She reported some anxiety and sadness because she lost her dog and a cat in 3 months and her 63 year old son who is living with her start using heroin.  She is relieved that now he finished the detox and social services is helping him to find a place.  In the meantime he is living with him.  Recently she was in the emergency room because of chest pain and that day she saw her son shooting heroin.  She realized that she need to continue therapy since [facility] her therapist is on maternity leave.  She started seeing Harriett Sine ball her previous therapist and she feels things are going well.  She still have vivid dreams, nightmares and flashback but she sleeps okay.  Once in a while she takes trazodone when she cannot sleep.  She denies any crying spells or any suicidal thoughts but sometimes she feels nervous and anxious.  She does not want to change medication.  She tried to cut down Valium half tablet but her anxiety got worse.  She has no tremors, shakes or any EPS.  She is taking Valium 5 mg mostly at bedtime and sertraline 150 mg daily.  She denies drinking or using any illegal substances.  She reported recently able to clean the house and that was a big achievement for her.   Past Psychiatric History:Reviewed. [place]depression, [place] Tried Paxil,  Prozac, nortriptyline, amitriptyline, Wellbutrin, Depakote,lamictal,Seroquel,Risperdal, Remeron, nefazodone and Cymbalta. Zoloft helped the most. H/Otwice inpatient because of suicidal thoughts. H/O IOP.No history of suicidal attempt.    Psychiatric Specialty Exam: Physical Exam  Review of Systems  There were no vitals taken for this visit.There is no height or weight on file to calculate BMI.  General Appearance: NA  Eye Contact:  NA  Speech:  Clear and Coherent  Volume:  Normal  Mood:  Anxious and Dysphoric  Affect:  NA  Thought Process:  Descriptions of Associations: Intact  Orientation:  Full (Time, Place, and Person)  Thought Content:  Rumination  Suicidal Thoughts:  No  Homicidal Thoughts:  No  Memory:  Immediate;   Fair Recent;   Good Remote;   Good  Judgement:  Intact  Insight:  Present  Psychomotor Activity:  NA  Concentration:  Concentration: Fair and Attention Span: Fair  Recall:  Good  Fund of Knowledge:  Fair  Language:  Good  Akathisia:  No  Handed:  Right  AIMS (if indicated):     Assets:  Communication Skills Desire for Improvement Housing Resilience  ADL's:  Intact  Cognition:  WNL  Sleep:   ok      Assessment and Plan: PTSD.  Major depressive disorder, recurrent.  GAD.  Patient is a stable on her current medication.  I encourage therapy and she now started her previous therapist since her current therapist is on maternity leave.  She does not want to change medication.  She is hoping her son can get some help from social services.  She is pleased that son had stopped drug since he completed detox.  I will continue 5 mg daily and Zoloft 150 mg daily.  Discussed medication side effects and benefits.  Recommended to call us back if she has any question of any concern.  Follow-up in 3 months.  Follow Up Instructions:    I discussed the assessment and treatment plan with the patient. The patient was provided an opportunity to ask questions and  all were answered. The patient agreed with the plan and demonstrated an understanding of the instructions.   The patient was advised to call back or seek an in-person evaluation if the symptoms worsen or if the condition fails to improve as anticipated.  I provided 20 minutes of non-face-to-face time during this encounter.   Kathlee Nations, MD

## 2020-01-28 ENCOUNTER — Other Ambulatory Visit (HOSPITAL_COMMUNITY): Payer: Self-pay | Admitting: Psychiatry

## 2020-01-28 DIAGNOSIS — F4310 Post-traumatic stress disorder, unspecified: Secondary | ICD-10-CM

## 2020-01-28 DIAGNOSIS — F411 Generalized anxiety disorder: Secondary | ICD-10-CM

## 2020-03-30 ENCOUNTER — Other Ambulatory Visit (HOSPITAL_COMMUNITY): Payer: Self-pay | Admitting: *Deleted

## 2020-03-30 ENCOUNTER — Telehealth (HOSPITAL_COMMUNITY): Payer: Self-pay | Admitting: *Deleted

## 2020-03-30 DIAGNOSIS — F411 Generalized anxiety disorder: Secondary | ICD-10-CM

## 2020-03-30 DIAGNOSIS — F4310 Post-traumatic stress disorder, unspecified: Secondary | ICD-10-CM

## 2020-03-30 MED ORDER — DIAZEPAM 5 MG PO TABS: ORAL_TABLET | ORAL | 0 refills | Status: DC

## 2020-03-30 NOTE — Telephone Encounter (Signed)
Pt has called several times this morning requesting refill of the Diazepam 5mg , last ordered 3/8 with 2 refills. Pt states is totally out of medication. Pt has an appointment on with you on 6/8. Please review and advise.

## 2020-03-30 NOTE — Telephone Encounter (Signed)
She is not due until June 8.  We can provide 5 tablets until we can discuss on her next appointment.

## 2020-04-02 ENCOUNTER — Encounter (HOSPITAL_COMMUNITY): Payer: Self-pay | Admitting: Psychiatry

## 2020-04-02 ENCOUNTER — Ambulatory Visit (INDEPENDENT_AMBULATORY_CARE_PROVIDER_SITE_OTHER): Payer: Self-pay | Admitting: Psychiatry

## 2020-04-02 ENCOUNTER — Other Ambulatory Visit: Payer: Self-pay

## 2020-04-02 VITALS — BP 120/74

## 2020-04-02 DIAGNOSIS — F330 Major depressive disorder, recurrent, mild: Secondary | ICD-10-CM

## 2020-04-02 DIAGNOSIS — F4310 Post-traumatic stress disorder, unspecified: Secondary | ICD-10-CM

## 2020-04-02 DIAGNOSIS — F411 Generalized anxiety disorder: Secondary | ICD-10-CM

## 2020-04-02 MED ORDER — PROPRANOLOL HCL 20 MG PO TABS: 20.0000 mg | ORAL_TABLET | Freq: Two times a day (BID) | ORAL | 0 refills | Status: DC

## 2020-04-02 MED ORDER — DIAZEPAM 5 MG PO TABS: ORAL_TABLET | ORAL | 0 refills | Status: DC

## 2020-04-02 MED ORDER — SERTRALINE HCL 100 MG PO TABS: 150.0000 mg | ORAL_TABLET | Freq: Every day | ORAL | 0 refills | Status: DC

## 2020-04-02 NOTE — Progress Notes (Signed)
Virtual Visit via Telephone Note  I connected with Sandra Herring on 04/02/20 at 10:00 AM EDT by telephone and verified that I am speaking with the correct person using two identifiers.   I discussed the limitations, risks, security and privacy concerns of performing an evaluation and management service by telephone and the availability of in person appointments. I also discussed with the patient that there may be a patient responsible charge related to this service. The patient expressed understanding and agreed to proceed.  Patient Location: Home Provider Location: Home Office  History of Present Illness: Patient is evaluated by phone session.  She is taking Valium and Zoloft and admitted last week she has to take extra Valium because she was under a lot of stress.  Her son relapsed into drugs but is still staying with him and she is hoping that he can restart the work which she promised.  Patient told his son is waiting for his Social Security card since he lost the original card.  Patient is in therapy with Jerral Bonito but now her therapist Toma Copier back from maternity leave she like to restart with [facility].  She continues to have sometimes vivid dreams and nightmares and flashback but they are stable.  She admitted some time crying spells but denies any suicidal thoughts, feeling of hopelessness or worthlessness.  She is is still using 2-3 times a week her portable TMS which helps her depression.  Her appetite is okay.  Her energy level is okay.  She does not meet the house but started painting and able to go to art center to see if she can sell her painting.  Patient enjoys painting.  She also like to have Inderal refilled since her PCP did not providing the Inderal as she has not seen him in a while.  Patient told that Inderal helps her migraine headaches and anxiety.  She is taking 20 mg twice a day.  She does monitor her blood pressure regularly.  She has no tremors, shakes or any EPS.  She  occasionally take trazodone and does not need any refill.  She denies any hallucination, paranoia or any anger.   Past Psychiatric History:Reviewed. H/[place]depression, anxiety and [place] Tried Paxil, Prozac, nortriptyline, amitriptyline, Wellbutrin, Depakote,lamictal,Seroquel,Risperdal, Remeron, nefazodone and Cymbalta. Zoloft helped the most. H/Otwice inpatient because of suicidal thoughts. H/O IOP.No history of suicidal attempt.   Psychiatric Specialty Exam: Physical Exam  Review of Systems  Blood pressure 120/74.There is no height or weight on file to calculate BMI.  General Appearance: NA  Eye Contact:  NA  Speech:  Normal Rate  Volume:  Normal  Mood:  Anxious  Affect:  NA  Thought Process:  Goal Directed  Orientation:  Full (Time, Place, and Person)  Thought Content:  Rumination  Suicidal Thoughts:  No  Homicidal Thoughts:  No  Memory:  Immediate;   Good Recent;   Fair Remote;   Fair  Judgement:  Intact  Insight:  Present  Psychomotor Activity:  NA  Concentration:  Concentration: Fair and Attention Span: Fair  Recall:  Good  Fund of Knowledge:  Fair  Language:  Good  Akathisia:  No  Handed:  Right  AIMS (if indicated):     Assets:  Communication Skills Desire for Improvement Housing Resilience  ADL's:  Intact  Cognition:  WNL  Sleep:   ok      Assessment and Plan: PTSD, major depressive disorder, recurrent.  Generalized anxiety disorder.  We discussed  medication side effects specially benzodiazepine tolerance withdrawal and abuse.  She promised that she will continue to take Valium 5 mg daily.  She is hoping to start therapy again with [name] since she is back from her currently.  She like to have a Inderal refilled since not able to see her PCP in a while and multiple to get refill.  I encouraged that she should see the PCP for physical and blood work.  We will refill 1 time Inderal 20 mg twice a day  until she find PCP.  Her blood pressure is normal and she will monitor her blood pressure regularly.  She has no tremor shakes or any EPS.  Continue Zoloft 150 mg daily, Valium 5 mg daily, Inderal 20 mg twice a day one-time refill and patient is still using portable TMS as needed.  Discussed medication side effects and benefits.  Recommended to call us back if is any question or any concern.  Follow-up in 3 months   Follow Up Instructions:    I discussed the assessment and treatment plan with the patient. The patient was provided an opportunity to ask questions and all were answered. The patient agreed with the plan and demonstrated an understanding of the instructions.   The patient was advised to call back or seek an in-person evaluation if the symptoms worsen or if the condition fails to improve as anticipated.  I provided 20 minutes of non-face-to-face time during this encounter.   Kathlee Nations, MD

## 2020-04-19 ENCOUNTER — Ambulatory Visit (HOSPITAL_COMMUNITY): Payer: Self-pay | Admitting: Licensed Clinical Social Worker

## 2020-04-30 ENCOUNTER — Other Ambulatory Visit (HOSPITAL_COMMUNITY): Payer: Self-pay | Admitting: Psychiatry

## 2020-04-30 DIAGNOSIS — F4310 Post-traumatic stress disorder, unspecified: Secondary | ICD-10-CM

## 2020-04-30 DIAGNOSIS — F411 Generalized anxiety disorder: Secondary | ICD-10-CM

## 2020-05-04 ENCOUNTER — Other Ambulatory Visit: Payer: Self-pay

## 2020-05-04 ENCOUNTER — Ambulatory Visit (INDEPENDENT_AMBULATORY_CARE_PROVIDER_SITE_OTHER): Payer: No Payment, Other | Admitting: Licensed Clinical Social Worker

## 2020-05-04 DIAGNOSIS — F418 Other specified anxiety disorders: Secondary | ICD-10-CM | POA: Diagnosis not present
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
## 2020-05-08 ENCOUNTER — Other Ambulatory Visit: Payer: Self-pay

## 2020-05-08 ENCOUNTER — Telehealth (INDEPENDENT_AMBULATORY_CARE_PROVIDER_SITE_OTHER): Payer: No Payment, Other | Admitting: Psychiatry

## 2020-05-08 ENCOUNTER — Encounter (HOSPITAL_COMMUNITY): Payer: Self-pay | Admitting: Psychiatry

## 2020-05-08 DIAGNOSIS — F330 Major depressive disorder, recurrent, mild: Secondary | ICD-10-CM

## 2020-05-08 DIAGNOSIS — F411 Generalized anxiety disorder: Secondary | ICD-10-CM

## 2020-05-08 DIAGNOSIS — F4310 Post-traumatic stress disorder, unspecified: Secondary | ICD-10-CM | POA: Diagnosis not present

## 2020-05-08 DIAGNOSIS — F3341 Major depressive disorder, recurrent, in partial remission: Secondary | ICD-10-CM

## 2020-05-08 MED ORDER — DIAZEPAM 5 MG PO TABS: ORAL_TABLET | ORAL | 2 refills | Status: DC

## 2020-05-08 MED ORDER — PROPRANOLOL HCL 20 MG PO TABS: 20.0000 mg | ORAL_TABLET | Freq: Two times a day (BID) | ORAL | 0 refills | Status: DC

## 2020-05-08 MED ORDER — SERTRALINE HCL 100 MG PO TABS: 150.0000 mg | ORAL_TABLET | Freq: Every day | ORAL | 0 refills | Status: DC

## 2020-05-08 MED ORDER — TRAZODONE HCL 50 MG PO TABS: 50.0000 mg | ORAL_TABLET | Freq: Every day | ORAL | 0 refills | Status: DC
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
## 2020-05-12 ENCOUNTER — Encounter (HOSPITAL_COMMUNITY): Payer: Self-pay

## 2020-05-12 ENCOUNTER — Emergency Department (HOSPITAL_COMMUNITY)
Admission: EM | Admit: 2020-05-12 | Discharge: 2020-05-12 | Disposition: A | Discharge status: Home / Self Care | Payer: Self-pay | Attending: Emergency Medicine | Admitting: Emergency Medicine

## 2020-05-12 DIAGNOSIS — Z79899 Other long term (current) drug therapy: Secondary | ICD-10-CM | POA: Insufficient documentation

## 2020-05-12 DIAGNOSIS — F17210 Nicotine dependence, cigarettes, uncomplicated: Secondary | ICD-10-CM | POA: Insufficient documentation

## 2020-05-12 DIAGNOSIS — L03116 Cellulitis of left lower limb: Secondary | ICD-10-CM | POA: Insufficient documentation

## 2020-05-12 LAB — CBC WITH DIFFERENTIAL/PLATELET
Abs Immature Granulocytes: 0.03 10*3/uL (ref 0.00–0.07)
Basophils Absolute: 0 10*3/uL (ref 0.0–0.1)
Basophils Relative: 0 %
Eosinophils Absolute: 0.2 10*3/uL (ref 0.0–0.5)
Eosinophils Relative: 2 %
HCT: 44.5 % (ref 36.0–46.0)
Hemoglobin: 14.3 g/dL (ref 12.0–15.0)
Immature Granulocytes: 0 %
Lymphocytes Relative: 15 %
Lymphs Abs: 1.5 10*3/uL (ref 0.7–4.0)
MCH: 28.1 pg (ref 26.0–34.0)
MCHC: 32.1 g/dL (ref 30.0–36.0)
MCV: 87.4 fL (ref 80.0–100.0)
Monocytes Absolute: 0.5 10*3/uL (ref 0.1–1.0)
Monocytes Relative: 4 %
Neutro Abs: 8.1 10*3/uL — ABNORMAL HIGH (ref 1.7–7.7)
Neutrophils Relative %: 79 %
Platelets: 252 10*3/uL (ref 150–400)
RBC: 5.09 MIL/uL (ref 3.87–5.11)
RDW: 13.5 % (ref 11.5–15.5)
WBC: 10.3 10*3/uL (ref 4.0–10.5)
nRBC: 0 % (ref 0.0–0.2)

## 2020-05-12 LAB — COMPREHENSIVE METABOLIC PANEL
ALT: 22 U/L (ref 0–44)
AST: 14 U/L — ABNORMAL LOW (ref 15–41)
Albumin: 3.9 g/dL (ref 3.5–5.0)
Alkaline Phosphatase: 78 U/L (ref 38–126)
Anion gap: 11 (ref 5–15)
BUN: 9 mg/dL (ref 6–20)
CO2: 22 mmol/L (ref 22–32)
Calcium: 8.9 mg/dL (ref 8.9–10.3)
Chloride: 102 mmol/L (ref 98–111)
Creatinine, Ser: 0.94 mg/dL (ref 0.44–1.00)
GFR calc Af Amer: >60 mL/min (ref >60)
GFR calc non Af Amer: >60 mL/min (ref >60)
Glucose, Bld: 150 mg/dL — ABNORMAL HIGH (ref 70–99)
Potassium: 3.9 mmol/L (ref 3.5–5.1)
Sodium: 135 mmol/L (ref 135–145)
Total Bilirubin: 0.4 mg/dL (ref 0.3–1.2)
Total Protein: 6.9 g/dL (ref 6.5–8.1)

## 2020-05-12 LAB — LACTIC ACID, PLASMA: Lactic Acid, Venous: 1.6 mmol/L (ref 0.5–1.9)

## 2020-05-12 MED ORDER — DOXYCYCLINE HYCLATE 100 MG PO CAPS: 100.0000 mg | ORAL_CAPSULE | Freq: Two times a day (BID) | ORAL | 0 refills | Status: DC

## 2020-05-12 MED ORDER — SODIUM CHLORIDE 0.9% FLUSH: 3.0000 mL | Freq: Once | INTRAVENOUS | Status: DC

## 2020-05-12 NOTE — ED Notes (Signed)
Pt received discharged papers and left without getting discharge vitals. Pt walked out.

## 2020-05-12 NOTE — ED Triage Notes (Signed)
Pt arrives to ED w/ c/o possible cellulitis to LLE. Wound started as insect bite 3 days ago and has progressively gotten more red and swollen over the last couple of days. Pt endorses mild fever, generalized fatigue over that time frame. Area warm to touch.

## 2020-05-12 NOTE — ED Provider Notes (Signed)
[facility] EMERGENCY DEPARTMENT Provider Note   CSN: [id number] Arrival date & time: 05/12/20  1049     History Chief Complaint  Patient presents with  . Cellulitis    Sandra Herring is a 59 y.o. female who presents emergency department chief complaint of medial left lower extremity pain.  Patient states that she had a blister on the middle of her leg 4 days ago.  She popped it and began having pain and progressively worsening erythema, induration.  Patient states that she probed the central area with a Q-tip 2 days ago and felt to tracks.  She had some mild pus that came out.  She states "I need this thing I indeed."  She denies a history of MRSA.  She had a fever last night but is afebrile at this time.  She has no other complaints.  HPI     Past Medical History:  Diagnosis Date  . Anxiety   . Depression   . GERD (gastroesophageal reflux disease)   . Headache    migraines takes toprol   . PTSD (post-traumatic stress disorder)   . PTSD (post-traumatic stress disorder)   . Seizures (HCC)    febrile seizure at age 27 months  . Thyroid nodule   . Vaginal infection     Patient Active Problem List   Diagnosis Date Noted  . GAD (generalized anxiety disorder) 05/08/2020  . PTSD (post-traumatic stress disorder) 05/08/2020  . MDD (major depressive disorder), recurrent, in partial remission (HCC) 05/08/2020  . Disease of thyroid gland 03/15/2018  . Migraine 03/15/2018  . Lichen sclerosus et atrophicus 05/30/2015    Past Surgical History:  Procedure Laterality Date  . 25 GAUGE PARS PLANA VITRECTOMY WITH 20 GAUGE MVR PORT FOR MACULAR HOLE Right 12/10/2017   Procedure: PARS PLANA VITRECTOMY WITH 25 GAUGE LAZER AND GAS MACULAR HOLE membrane pale gas;  Surgeon: Rennis Chris, MD;  Location: [facility] OR;  Service: Ophthalmology;  Laterality: Right;  . COLONOSCOPY    . DORSAL COMPARTMENT RELEASE Left 07/29/2019   Procedure: RELEASE DORSAL COMPARTMENT (DEQUERVAIN)  LEFT WRIST;  Surgeon: Betha Loa, MD;  Location: [facility];  Service: Orthopedics;  Laterality: Left;  . EYE SURGERY     Dr. Richardean Chimera  . HERNIA REPAIR    . lumbar    . TONSILLECTOMY  2005  . TUBAL LIGATION       OB History    Gravida  3   Para  3   Term  3   Preterm      AB      Living  3     SAB      TAB      Ectopic      Multiple      Live Births              Family History  Problem Relation Age of Onset  . Cataracts Mother   . Depression Mother   . Cataracts Father   . Depression Father   . Cataracts Maternal Grandfather   . Amblyopia Neg Hx   . Blindness Neg Hx   . Glaucoma Neg Hx   . Diabetes Neg Hx   . Macular degeneration Neg Hx   . Retinal detachment Neg Hx   . Strabismus Neg Hx   . Retinitis pigmentosa Neg Hx     Social History   Tobacco Use  . Smoking status: Current Every Day Smoker    Packs/day: 0.30  Years: 1.00    Pack years: 0.30    Types: Cigarettes    Last attempt to quit: 10/20/2014    Years since quitting: 5.5  . Smokeless tobacco: Never Used  . Tobacco comment: Reports started 11/2017 for interaction with neighbors.   Vaping Use  . Vaping Use: Never used  Substance Use Topics  . Alcohol use: Yes    Comment: occ use  . Drug use: No    Home Medications Prior to Admission medications   Medication Sig Start Date End Date Taking? Authorizing Provider  Ascorbic Acid (VITAMIN C) 1000 MG tablet Take 4,000 mg by mouth daily.    [provider]  calcium carbonate (TUMS - DOSED IN MG ELEMENTAL CALCIUM) 500 MG chewable tablet Chew by mouth.    [provider]  [facility] Liver Oil OIL Take by mouth.    [provider]  diazepam (VALIUM) 5 MG tablet Take one tab at bed time 05/08/20   Zena Amos, MD  famotidine (PEPCID) 20 MG tablet Take 20 mg by mouth daily as needed for heartburn.     [provider]  levocetirizine (XYZAL) 5 MG tablet Take 5 mg by mouth every evening.     [provider]  Misc Natural Products (GINSENG-AMERICAN PO) Take by mouth.    [provider]  propranolol (INDERAL) 20 MG tablet Take 1 tablet (20 mg total) by mouth 2 (two) times daily. 05/08/20   Zena Amos, MD  sertraline (ZOLOFT) 100 MG tablet Take 1.5 tablets (150 mg total) by mouth daily. 05/08/20   Zena Amos, MD  traZODone (DESYREL) 50 MG tablet Take 1 tablet (50 mg total) by mouth at bedtime. 05/08/20   Zena Amos, MD    Allergies    Serzone [nefazodone], Nsaids, and Lamictal [lamotrigine]  Review of Systems   Review of Systems Ten systems reviewed and are negative for acute change, except as noted in the HPI.   Physical Exam Updated Vital Signs BP 129/77 (BP Location: Left Arm)   Pulse 64   Temp 99.1 F (37.3 C) (Oral)   Resp 18   SpO2 96%   Physical Exam Vitals and nursing note reviewed.  Constitutional:      General: She is not in acute distress.    Appearance: She is well-developed. She is not diaphoretic.  HENT:     Head: Normocephalic and atraumatic.     Nose: Nose normal.     Mouth/Throat:     Mouth: Mucous membranes are dry.  Eyes:     General: No scleral icterus.    Extraocular Movements: Extraocular movements intact.     Conjunctiva/sclera: Conjunctivae normal.     Pupils: Pupils are equal, round, and reactive to light.  Cardiovascular:     Rate and Rhythm: Normal rate and regular rhythm.     Heart sounds: Normal heart sounds. No murmur heard.  No friction rub. No gallop.   Pulmonary:     Effort: Pulmonary effort is normal. No respiratory distress.     Breath sounds: Normal breath sounds.  Abdominal:     General: Bowel sounds are normal. There is no distension.     Palpations: Abdomen is soft. There is no mass.     Tenderness: There is no abdominal tenderness. There is no guarding.  Musculoskeletal:     Cervical back: Normal range of motion.     Comments: Medially, there is surrounding erythema and induration without  lymphangitis.  Skin:    General: Skin is  warm and dry.  Neurological:     Mental Status: She is alert and oriented to person, place, and time.  Psychiatric:        Behavior: Behavior normal.     ED Results / Procedures / Treatments   Labs (all labs ordered are listed, but only abnormal results are displayed) Labs Reviewed  CBC WITH DIFFERENTIAL/PLATELET - Abnormal; Notable for the following components:      Result Value   Neutro Abs 8.1 (*)    All other components within normal limits  LACTIC ACID, PLASMA  LACTIC ACID, PLASMA  COMPREHENSIVE METABOLIC PANEL  URINALYSIS, ROUTINE W REFLEX MICROSCOPIC    EKG None  Radiology No results found.  Procedures Ultrasound ED Soft Tissue  Date/Time: 05/12/2020 12:49 PM Performed by: Arthor Captain, PA-C Authorized by: Arthor Captain, PA-C   Procedure details:    Indications: localization of abscess and evaluate for cellulitis     Transverse view:  Visualized   Longitudinal view:  Visualized   Images: not archived   Location:    Location: lower extremity     Side:  Left Findings:     no abscess present    cellulitis present   (including critical care time)  Medications Ordered in ED Medications  sodium chloride flush (NS) 0.9 % injection 3 mL (has no administration in time range)    ED Course  I have reviewed the triage vital signs and the nursing notes.  Pertinent labs & imaging results that were available during my care of the patient were reviewed by me and considered in my medical decision making (see chart for details).    MDM Rules/Calculators/A&P                         Patient here with complaint of leg pain.  Labs ordered showed elevated glucose.  Lactic acid within normal limits, CBC without elevated white blood count.  She is afebrile and hemodynamically stable.  I have no clinical suspicion for DVT or acute arterial occlusion.  She has localized erythema, cellulitis on ultrasound at bedside, no  collection of fluid seen.  Patient has not been taking antibiotics.  Will place on doxycycline.  She has been demarcating the area at home.  Discussed return precautions.  She appears otherwise appropriate for discharge at this time Final Clinical Impression(s) / ED Diagnoses Final diagnoses:  None    Rx / DC Orders ED Discharge Orders    None       Arthor Captain, PA-C 05/12/20 1603    Gerhard Munch, MD 05/12/20 [phone]

## 2020-05-12 NOTE — ED Notes (Signed)
Ask patient for urine sample, patient stated that she did not need to urinate at this time. 

## 2020-05-12 NOTE — Discharge Instructions (Signed)

## 2020-05-29 ENCOUNTER — Other Ambulatory Visit: Payer: Self-pay

## 2020-05-29 ENCOUNTER — Ambulatory Visit (INDEPENDENT_AMBULATORY_CARE_PROVIDER_SITE_OTHER): Payer: No Payment, Other | Admitting: Licensed Clinical Social Worker

## 2020-05-29 DIAGNOSIS — F418 Other specified anxiety disorders: Secondary | ICD-10-CM | POA: Diagnosis not present
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
## 2020-06-05 ENCOUNTER — Ambulatory Visit (INDEPENDENT_AMBULATORY_CARE_PROVIDER_SITE_OTHER): Payer: No Payment, Other | Admitting: Licensed Clinical Social Worker

## 2020-06-05 ENCOUNTER — Other Ambulatory Visit: Payer: Self-pay

## 2020-06-05 DIAGNOSIS — F418 Other specified anxiety disorders: Secondary | ICD-10-CM

## 2020-06-06 NOTE — Progress Notes (Signed)
THERAPIST PROGRESS NOTE  Session Time: 54 min Virtual Visit via Video Note  I connected with Sandra Herring on 06/06/20 at  3:00 PM EDT by a video enabled telemedicine application and verified that I am speaking with the correct person using two identifiers.  Location: Patient: home Provider: [facility]   I discussed the limitations of evaluation and management by telemedicine and the availability of in person appointments. The patient expressed understanding and agreed to proceed.  I discussed the assessment and treatment plan with the patient. The patient was provided an opportunity to ask questions and all were answered. The patient agreed with the plan and demonstrated an understanding of the instructions.  I provided 54 minutes of non-face-to-face time during this encounter.  Seiling Sink, LCSW  Participation Level: Active  Behavioral Response: CasualAlertAnxious  Type of Therapy: Individual Therapy  Treatment Goals addressed: Anxiety and Coping  Interventions: Motivational Interviewing and Supportive  Summary: Sandra Herring is a 59 y.o. female who presents with hx of anx/dep. This date LCSW sends link by email for virtual session per pt preference and request. When pt does not sign on LCSW phones pt. She states she does not have an email. Clinician asked if pt willing to try the text link which was agreed to. Text link successful and session conducted via video telemedicine. This date pt immediately states "I've had a good week" when asked how she is doing. Pt advises she had a very direct converstaion with her son last Tues about his behavior and living with her. She reports since that time he "has been a real pleasure to have around". She states he is not using drugs, he is being kind and considerate, helping around the house with cooking and cleaning. She reports he will have to get a job to help with expenses. At this time she states she is not asking him to leave.  She did however call the authorities as discussed last session to see what steps she would need to take to get him to leave if she asked him too and he was uncooperative. Pt advises the reduced stress from son has been helpful with her coping. She reports she has been out walking an hour a day since last session in the park. She reports this has been a wonderful way to start her day and she is appreciating the benefits of exercise and being in nature. She states "The trees are my people". She reports she used to spend a lot of time under a large tree as a child which was comforting. Described walking as "liberating". Pt states she has been praying to God to be a good steward of all the blessings he has given her and as walking realized her body and life is a gift to be a better steward of. She feels she needs to address her smoking, overeating. She states she is unhappy with her current weight and not being able to wear some of her clothes. She reports she is going to do intermittent fasting which was explored. LCSW encouraged the role of the walking she successfully started as a healthy weight loss strategy in addition to a mood regulator. LCSW reassessed pt's need for food based on last session. She reports she got a recent check from her [employer] and has an adequate food supply. Continues to have financial strain. (She mentions SS Disability claim in process.) Encouraged pt to investigate food pantries as a way to offset grocery expense and put that  money toward other bills. Pt verbalizes understanding and intent to f/u. Pt reports she had a conversation with both of her dtrs since last session. Provides details. Often dtrs do not respond to her outreach. She further reports she has a new friend. She advises this is a man she has actually known for a long time, he lives in [place]. Reportedly his wife of 30 yrs recently died and she has been in touch via [employer]. He has come over a couple of times, including for his  birthday. When asked if this is a romantic relationship she states "I don't think so. I don't feel like a sexual being right now". Addressed these feelings/meaning. Pt continues to be tangential with much restlessness throughout session. LCSW reviewed poc with pt's verbal agreement prior to close of session. Pt states appreciation for care.    Suicidal/Homicidal: Nowithout intent/plan  Therapist Response: Pt remains receptive to care.  Plan: Return again in 1 week.  Diagnosis: Axis I: Anxiety with Depression    Axis II: Deferred  Udall Sink, LCSW 06/06/2020

## 2020-06-19 ENCOUNTER — Ambulatory Visit (INDEPENDENT_AMBULATORY_CARE_PROVIDER_SITE_OTHER): Payer: No Payment, Other | Admitting: Licensed Clinical Social Worker

## 2020-06-19 ENCOUNTER — Other Ambulatory Visit: Payer: Self-pay

## 2020-06-19 DIAGNOSIS — F418 Other specified anxiety disorders: Secondary | ICD-10-CM
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
## 2020-06-26 ENCOUNTER — Ambulatory Visit (INDEPENDENT_AMBULATORY_CARE_PROVIDER_SITE_OTHER): Payer: No Payment, Other | Admitting: Licensed Clinical Social Worker

## 2020-06-26 ENCOUNTER — Other Ambulatory Visit: Payer: Self-pay

## 2020-06-26 DIAGNOSIS — F418 Other specified anxiety disorders: Secondary | ICD-10-CM

## 2020-06-27 ENCOUNTER — Encounter (HOSPITAL_COMMUNITY): Payer: Self-pay | Admitting: Psychiatry
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
## 2020-06-29 ENCOUNTER — Other Ambulatory Visit: Payer: Self-pay

## 2020-06-29 ENCOUNTER — Encounter (HOSPITAL_COMMUNITY): Payer: Self-pay | Admitting: Psychiatry

## 2020-06-29 ENCOUNTER — Telehealth (INDEPENDENT_AMBULATORY_CARE_PROVIDER_SITE_OTHER): Payer: No Payment, Other | Admitting: Psychiatry

## 2020-06-29 DIAGNOSIS — F3341 Major depressive disorder, recurrent, in partial remission: Secondary | ICD-10-CM

## 2020-06-29 DIAGNOSIS — F411 Generalized anxiety disorder: Secondary | ICD-10-CM

## 2020-06-29 DIAGNOSIS — F4310 Post-traumatic stress disorder, unspecified: Secondary | ICD-10-CM | POA: Diagnosis not present

## 2020-06-29 MED ORDER — SERTRALINE HCL 100 MG PO TABS: 150.0000 mg | ORAL_TABLET | Freq: Every day | ORAL | 1 refills | Status: DC

## 2020-06-29 MED ORDER — DIAZEPAM 5 MG PO TABS: ORAL_TABLET | ORAL | 2 refills | Status: DC

## 2020-06-29 MED ORDER — PROPRANOLOL HCL 20 MG PO TABS: 20.0000 mg | ORAL_TABLET | Freq: Two times a day (BID) | ORAL | 1 refills | Status: DC

## 2020-06-29 MED ORDER — TRAZODONE HCL 50 MG PO TABS: 50.0000 mg | ORAL_TABLET | Freq: Every day | ORAL | 1 refills | Status: DC
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
## 2020-07-06 ENCOUNTER — Telehealth (HOSPITAL_COMMUNITY): Payer: Self-pay | Admitting: Licensed Clinical Social Worker

## 2020-07-06 NOTE — Telephone Encounter (Signed)
LCSW returned pt's phone call and vm regarding scheduling of appointments. Call went to pt's vm. Left detailed message about the current state of scheduling at [facility]. Advised pt of this clinician's recall of scheduling conversation during last session where pt was informed there could be a larger gap than q other wk for next avail appt and then return to q other wk. Again explained limitation of 2 f/u appts per pt for the present scheduling policy.

## 2020-08-22 ENCOUNTER — Ambulatory Visit (INDEPENDENT_AMBULATORY_CARE_PROVIDER_SITE_OTHER): Payer: No Payment, Other | Admitting: Licensed Clinical Social Worker

## 2020-08-22 ENCOUNTER — Other Ambulatory Visit: Payer: Self-pay

## 2020-08-22 DIAGNOSIS — F418 Other specified anxiety disorders: Secondary | ICD-10-CM
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
## 2020-08-24 ENCOUNTER — Ambulatory Visit: Admission: RE | Admit: 2020-08-24 | Payer: Self-pay | Source: Ambulatory Visit

## 2020-08-24 ENCOUNTER — Other Ambulatory Visit: Payer: Self-pay

## 2020-08-24 ENCOUNTER — Ambulatory Visit
Admission: RE | Admit: 2020-08-24 | Discharge: 2020-08-25 | Disposition: A | Discharge status: Home / Self Care | Payer: Self-pay | Source: Ambulatory Visit | Attending: Emergency Medicine | Admitting: Emergency Medicine

## 2020-08-24 DIAGNOSIS — L02415 Cutaneous abscess of right lower limb: Secondary | ICD-10-CM | POA: Insufficient documentation

## 2020-08-25 ENCOUNTER — Emergency Department (HOSPITAL_COMMUNITY)
Admission: EM | Admit: 2020-08-25 | Discharge: 2020-08-25 | Disposition: A | Discharge status: Home / Self Care | Payer: Self-pay | Attending: Emergency Medicine | Admitting: Emergency Medicine

## 2020-08-25 ENCOUNTER — Encounter (HOSPITAL_COMMUNITY): Payer: Self-pay | Admitting: *Deleted

## 2020-08-25 ENCOUNTER — Other Ambulatory Visit: Payer: Self-pay

## 2020-08-25 DIAGNOSIS — Z9851 Tubal ligation status: Secondary | ICD-10-CM | POA: Insufficient documentation

## 2020-08-25 DIAGNOSIS — L02415 Cutaneous abscess of right lower limb: Secondary | ICD-10-CM | POA: Insufficient documentation

## 2020-08-25 DIAGNOSIS — F17210 Nicotine dependence, cigarettes, uncomplicated: Secondary | ICD-10-CM | POA: Insufficient documentation

## 2020-08-25 DIAGNOSIS — L0291 Cutaneous abscess, unspecified: Secondary | ICD-10-CM

## 2020-08-25 HISTORY — DX: Unspecified visual loss: H54.7

## 2020-08-25 MED ORDER — ACETAMINOPHEN 500 MG PO TABS
1000.0000 mg | ORAL_TABLET | Freq: Once | ORAL | Status: AC
  Administered 2020-08-25: 1000 mg via ORAL
  Filled 2020-08-25: qty 2

## 2020-08-25 MED ORDER — CLINDAMYCIN PHOSPHATE 600 MG/50ML IV SOLN
600.0000 mg | Freq: Once | INTRAVENOUS | Status: AC
  Administered 2020-08-25: 600 mg via INTRAVENOUS
  Filled 2020-08-25: qty 50

## 2020-08-25 MED ORDER — LIDOCAINE HCL 2 % IJ SOLN
10.0000 mL | Freq: Once | INTRAMUSCULAR | Status: AC
  Administered 2020-08-25: 200 mg via INTRADERMAL
  Filled 2020-08-25: qty 20

## 2020-08-25 MED ORDER — HYDROCODONE-ACETAMINOPHEN 5-325 MG PO TABS: 1.0000 | ORAL_TABLET | Freq: Four times a day (QID) | ORAL | 0 refills | Status: DC | PRN

## 2020-08-25 MED ORDER — DOXYCYCLINE HYCLATE 100 MG PO CAPS: 100.0000 mg | ORAL_CAPSULE | Freq: Two times a day (BID) | ORAL | 0 refills | Status: AC

## 2020-08-25 NOTE — Discharge Instructions (Addendum)
You were given a prescription for antibiotics. Please take the antibiotic prescription fully.   Prescription given for Norco. Take medication as directed and do not operate machinery, drive a car, or work while taking this medication as it can make you drowsy.    Please follow up with your primary care provider, urgent care or in the ED in 48 hours for wound recheck.   Please return to the emergency room immediately if you experience any new or worsening symptoms or any symptoms that indicate worsening infection such as fevers, increased redness/swelling/pain, warmth, or drainage from the affected area.

## 2020-08-25 NOTE — ED Triage Notes (Signed)
Abscess upper inner rt thigh x 4 days

## 2020-08-25 NOTE — ED Provider Notes (Signed)
[facility]-EMERGENCY DEPT Provider Note   CSN: [id number] Arrival date & time: 08/25/20  1010     History Chief Complaint  Patient presents with  . Abscess    Sandra Herring is a 59 y.o. female.  HPI   59 year old female with a history of anxiety/depression, GERD, headache, PTSD, seizures, thyroid nodule, who presents emergency department today for evaluation of an abscess.  Patient states she had a pimple to the right upper medial thigh that started a few days ago.  States she was stressed and picking at it and has now developed some redness, swelling and has had some drainage from the wound.  Has had some sweats/chills but no documented fevers at home.  Denies any systemic symptoms such as vomiting, nausea.  She denies a history of diabetes.  Past Medical History:  Diagnosis Date  . Anxiety   . Blind   . Depression   . GERD (gastroesophageal reflux disease)   . Headache    migraines takes toprol   . PTSD (post-traumatic stress disorder)   . PTSD (post-traumatic stress disorder)   . Seizures (HCC)    febrile seizure at age 54 months  . Thyroid nodule   . Vaginal infection     Patient Active Problem List   Diagnosis Date Noted  . GAD (generalized anxiety disorder) 05/08/2020  . PTSD (post-traumatic stress disorder) 05/08/2020  . MDD (major depressive disorder), recurrent, in partial remission (HCC) 05/08/2020  . Disease of thyroid gland 03/15/2018  . Migraine 03/15/2018  . Lichen sclerosus et atrophicus 05/30/2015    Past Surgical History:  Procedure Laterality Date  . 25 GAUGE PARS PLANA VITRECTOMY WITH 20 GAUGE MVR PORT FOR MACULAR HOLE Right 12/10/2017   Procedure: PARS PLANA VITRECTOMY WITH 25 GAUGE LAZER AND GAS MACULAR HOLE membrane pale gas;  Surgeon: Rennis Chris, MD;  Location: [facility] OR;  Service: Ophthalmology;  Laterality: Right;  . COLONOSCOPY    . DORSAL COMPARTMENT RELEASE Left 07/29/2019   Procedure: RELEASE DORSAL COMPARTMENT  (DEQUERVAIN) LEFT WRIST;  Surgeon: Betha Loa, MD;  Location: [facility];  Service: Orthopedics;  Laterality: Left;  . EYE SURGERY     Dr. Richardean Chimera  . HERNIA REPAIR    . lumbar    . TONSILLECTOMY  2005  . TUBAL LIGATION       OB History    Gravida  3   Para  3   Term  3   Preterm      AB      Living  3     SAB      TAB      Ectopic      Multiple      Live Births              Family History  Problem Relation Age of Onset  . Cataracts Mother   . Depression Mother   . Cataracts Father   . Depression Father   . Cataracts Maternal Grandfather   . Amblyopia Neg Hx   . Blindness Neg Hx   . Glaucoma Neg Hx   . Diabetes Neg Hx   . Macular degeneration Neg Hx   . Retinal detachment Neg Hx   . Strabismus Neg Hx   . Retinitis pigmentosa Neg Hx     Social History   Tobacco Use  . Smoking status: Current Every Day Smoker    Packs/day: 0.30    Years: 1.00    Pack years: 0.30  Types: Cigarettes    Last attempt to quit: 10/20/2014    Years since quitting: 5.8  . Smokeless tobacco: Never Used  . Tobacco comment: Reports started 11/2017 for interaction with neighbors.   Vaping Use  . Vaping Use: Never used  Substance Use Topics  . Alcohol use: Yes    Comment: occ use  . Drug use: No    Home Medications Prior to Admission medications   Medication Sig Start Date End Date Taking? Authorizing Provider  Ascorbic Acid (VITAMIN C) 1000 MG tablet Take 4,000 mg by mouth daily.    [provider]  calcium carbonate (TUMS - DOSED IN MG ELEMENTAL CALCIUM) 500 MG chewable tablet Chew by mouth.    [provider]  [facility] Liver Oil OIL Take by mouth.    [provider]  diazepam (VALIUM) 5 MG tablet Take one tab at bed time 06/29/20   Zena Amos, MD  doxycycline (VIBRAMYCIN) 100 MG capsule Take 1 capsule (100 mg total) by mouth 2 (two) times daily for 7 days. 08/25/20 09/01/20  Setsuko Robins S, PA-C  famotidine (PEPCID) 20 MG  tablet Take 20 mg by mouth daily as needed for heartburn.     [provider]  HYDROcodone-acetaminophen (NORCO/VICODIN) 5-325 MG tablet Take 1 tablet by mouth every 6 (six) hours as needed. 08/25/20   Payeton Germani S, PA-C  levocetirizine (XYZAL) 5 MG tablet Take 5 mg by mouth every evening.    [provider]  Misc Natural Products (GINSENG-AMERICAN PO) Take by mouth.    [provider]  propranolol (INDERAL) 20 MG tablet Take 1 tablet (20 mg total) by mouth 2 (two) times daily. 06/29/20   Zena Amos, MD  sertraline (ZOLOFT) 100 MG tablet Take 1.5 tablets (150 mg total) by mouth daily. 06/29/20   Zena Amos, MD  traZODone (DESYREL) 50 MG tablet Take 1 tablet (50 mg total) by mouth at bedtime. 06/29/20   Zena Amos, MD    Allergies    Serzone [nefazodone], Nsaids, and Lamictal [lamotrigine]  Review of Systems   Review of Systems  Constitutional: Positive for chills and diaphoresis.  HENT: Negative for ear pain and sore throat.   Eyes: Negative for visual disturbance.  Respiratory: Negative for cough and shortness of breath.   Cardiovascular: Negative for chest pain.  Gastrointestinal: Negative for abdominal pain, constipation, diarrhea, nausea and vomiting.  Genitourinary: Negative for dysuria and hematuria.  Musculoskeletal: Negative for back pain.  Skin: Positive for color change and wound.  Neurological: Negative for headaches.  All other systems reviewed and are negative.   Physical Exam Updated Vital Signs BP (!) 143/94 (BP Location: Left Arm)   Pulse 88   Temp 98.2 F (36.8 C) (Oral)   Resp 16   Ht 5\' 8"  (1.727 m)   Wt 113.4 kg   SpO2 97%   BMI 38.01 kg/m   Physical Exam Vitals and nursing note reviewed.  Constitutional:      General: She is not in acute distress.    Appearance: She is well-developed.  HENT:     Head: Normocephalic and atraumatic.  Eyes:     Conjunctiva/sclera: Conjunctivae normal.  Cardiovascular:     Rate and  Rhythm: Normal rate and regular rhythm.     Heart sounds: No murmur heard.   Pulmonary:     Effort: Pulmonary effort is normal. No respiratory distress.     Breath sounds: Normal breath sounds.  Abdominal:     General: Bowel sounds are  normal.     Palpations: Abdomen is soft.     Tenderness: There is no abdominal tenderness.  Musculoskeletal:     Cervical back: Neck supple.     Comments: 2cm area of fluctuance to the right medial thigh with central opening and scant pus drainage. Surrounding induration, erythema, and warmth noted, no crepitus and no tracking of erythema toward the perineum.   Skin:    General: Skin is warm and dry.  Neurological:     Mental Status: She is alert.     ED Results / Procedures / Treatments   Labs (all labs ordered are listed, but only abnormal results are displayed) Labs Reviewed - No data to display  EKG None  Radiology No results found.  Procedures .Marland Kitchen.Incision and Drainage  Date/Time: 08/25/2020 12:26 PM Performed by: Karrie Meresouture, Saskia Simerson S, PA-C Authorized by: Karrie Meresouture, Sani Madariaga S, PA-C   Consent:    Consent obtained:  Verbal   Consent given by:  Patient   Risks discussed:  Bleeding, incomplete drainage, pain and damage to other organs   Alternatives discussed:  No treatment Universal protocol:    Procedure explained and questions answered to patient or proxy's satisfaction: yes     Relevant documents present and verified: yes     Test results available and properly labeled: yes     Imaging studies available: yes     Required blood products, implants, devices, and special equipment available: yes     Site/side marked: yes     Immediately prior to procedure a time out was called: yes     Patient identity confirmed:  Verbally with patient Location:    Type:  Abscess   Location:  Lower extremity   Lower extremity location:  Leg   Leg location:  R upper leg Pre-procedure details:    Skin preparation:  Betadine Anesthesia (see MAR for  exact dosages):    Anesthesia method:  Local infiltration   Local anesthetic:  Lidocaine 2% w/o epi Procedure type:    Complexity:  Simple Procedure details:    Incision types:  Single straight   Incision depth:  Subcutaneous   Scalpel blade:  11   Wound management:  Probed and deloculated   Drainage:  Purulent and bloody   Drainage amount:  Scant   Packing materials:  None Post-procedure details:    Patient tolerance of procedure:  Tolerated well, no immediate complications   (including critical care time)  Medications Ordered in ED Medications  clindamycin (CLEOCIN) IVPB 600 mg (600 mg Intravenous New Bag/Given 08/25/20 1224)  acetaminophen (TYLENOL) tablet 1,000 mg (1,000 mg Oral Given 08/25/20 1111)  lidocaine (XYLOCAINE) 2 % (with pres) injection 200 mg (200 mg Intradermal Given 08/25/20 1112)    ED Course  I have reviewed the triage vital signs and the nursing notes.  Pertinent labs & imaging results that were available during my care of the patient were reviewed by me and considered in my medical decision making (see chart for details).    MDM Rules/Calculators/A&P                          Patient with skin abscess amenable to incision and drainage.  Abscess was not large enough to warrant packing or drain,  wound recheck in 2 days. Encouraged home warm soaks and flushing.  Moderate signs of cellulitis is surrounding skin therefore will treat with abx. Advised on plan for f/u in 2 days and on strict return precautions.  She voices understanding of the plan and reasons to return. All questions answered, pt stable for d/c.     Final Clinical Impression(s) / ED Diagnoses Final diagnoses:  Abscess    Rx / DC Orders ED Discharge Orders         Ordered    doxycycline (VIBRAMYCIN) 100 MG capsule  2 times daily        08/25/20 1225    HYDROcodone-acetaminophen (NORCO/VICODIN) 5-325 MG tablet  Every 6 hours PRN        08/25/20 1228           Lanessa Shill S,  PA-C 08/25/20 1228    Mancel Bale, MD 08/25/20 1704

## 2020-08-25 NOTE — ED Notes (Signed)
Wound cleaned and dressed with non stick and covered by coverlet dressing.

## 2020-08-30 ENCOUNTER — Emergency Department (HOSPITAL_COMMUNITY)
Admission: EM | Admit: 2020-08-30 | Discharge: 2020-08-30 | Disposition: A | Discharge status: Home / Self Care | Payer: Self-pay | Attending: Emergency Medicine | Admitting: Emergency Medicine

## 2020-08-30 ENCOUNTER — Encounter (HOSPITAL_COMMUNITY): Payer: Self-pay

## 2020-08-30 ENCOUNTER — Other Ambulatory Visit: Payer: Self-pay

## 2020-08-30 DIAGNOSIS — F17210 Nicotine dependence, cigarettes, uncomplicated: Secondary | ICD-10-CM | POA: Insufficient documentation

## 2020-08-30 DIAGNOSIS — Z5189 Encounter for other specified aftercare: Secondary | ICD-10-CM

## 2020-08-30 DIAGNOSIS — Z4801 Encounter for change or removal of surgical wound dressing: Secondary | ICD-10-CM | POA: Insufficient documentation

## 2020-08-30 NOTE — Discharge Instructions (Addendum)
Please return for any problem.  Follow-up with your regular care provider as instructed. °

## 2020-08-30 NOTE — ED Triage Notes (Signed)
Pt ambulates to ed for wound check following lancing to wound on LRE. Was here Saturday for abscess and was told to f/u here or with PCP. Sts it appears to be healing well. Is taking prescribed abx. Pt denies N/V/D, Fever, or chills. VS WNL.

## 2020-08-30 NOTE — ED Provider Notes (Signed)
[facility]-EMERGENCY DEPT Provider Note   CSN: [id number] Arrival date & time: 08/30/20  7829     History Chief Complaint  Patient presents with  . Wound Check    Sandra Herring is a 59 y.o. female.  59 year old female with prior medical history as detailed below presents for evaluation.  She reports I&D of abscess to the right medial thigh 5 days prior.  She presents today for wound check.  Patient reports decreased drainage from the area.  She denies fever.  She is compliant with prescribed doxycycline.  She overall feels improved.  The history is provided by the patient and medical records.  Wound Check This is a new problem. The current episode started more than 2 days ago. The problem occurs rarely. The problem has been gradually improving. Nothing aggravates the symptoms. Nothing relieves the symptoms.       Past Medical History:  Diagnosis Date  . Anxiety   . Blind   . Depression   . GERD (gastroesophageal reflux disease)   . Headache    migraines takes toprol   . PTSD (post-traumatic stress disorder)   . PTSD (post-traumatic stress disorder)   . Seizures (HCC)    febrile seizure at age 34 months  . Thyroid nodule   . Vaginal infection     Patient Active Problem List   Diagnosis Date Noted  . GAD (generalized anxiety disorder) 05/08/2020  . PTSD (post-traumatic stress disorder) 05/08/2020  . MDD (major depressive disorder), recurrent, in partial remission (HCC) 05/08/2020  . Disease of thyroid gland 03/15/2018  . Migraine 03/15/2018  . Lichen sclerosus et atrophicus 05/30/2015    Past Surgical History:  Procedure Laterality Date  . 25 GAUGE PARS PLANA VITRECTOMY WITH 20 GAUGE MVR PORT FOR MACULAR HOLE Right 12/10/2017   Procedure: PARS PLANA VITRECTOMY WITH 25 GAUGE LAZER AND GAS MACULAR HOLE membrane pale gas;  Surgeon: Rennis Chris, MD;  Location: [facility] OR;  Service: Ophthalmology;  Laterality: Right;  . COLONOSCOPY    . DORSAL  COMPARTMENT RELEASE Left 07/29/2019   Procedure: RELEASE DORSAL COMPARTMENT (DEQUERVAIN) LEFT WRIST;  Surgeon: Betha Loa, MD;  Location: [facility];  Service: Orthopedics;  Laterality: Left;  . EYE SURGERY     Dr. Richardean Chimera  . HERNIA REPAIR    . lumbar    . TONSILLECTOMY  2005  . TUBAL LIGATION       OB History    Gravida  3   Para  3   Term  3   Preterm      AB      Living  3     SAB      TAB      Ectopic      Multiple      Live Births              Family History  Problem Relation Age of Onset  . Cataracts Mother   . Depression Mother   . Cataracts Father   . Depression Father   . Cataracts Maternal Grandfather   . Amblyopia Neg Hx   . Blindness Neg Hx   . Glaucoma Neg Hx   . Diabetes Neg Hx   . Macular degeneration Neg Hx   . Retinal detachment Neg Hx   . Strabismus Neg Hx   . Retinitis pigmentosa Neg Hx     Social History   Tobacco Use  . Smoking status: Current Every Day Smoker    Packs/day:  0.30    Years: 1.00    Pack years: 0.30    Types: Cigarettes    Last attempt to quit: 10/20/2014    Years since quitting: 5.8  . Smokeless tobacco: Never Used  . Tobacco comment: Reports started 11/2017 for interaction with neighbors.   Vaping Use  . Vaping Use: Never used  Substance Use Topics  . Alcohol use: Yes    Comment: occ use  . Drug use: No    Home Medications Prior to Admission medications   Medication Sig Start Date End Date Taking? Authorizing Provider  Ascorbic Acid (VITAMIN C) 1000 MG tablet Take 4,000 mg by mouth daily.    [provider]  calcium carbonate (TUMS - DOSED IN MG ELEMENTAL CALCIUM) 500 MG chewable tablet Chew by mouth.    [provider]  [facility] Liver Oil OIL Take by mouth.    [provider]  diazepam (VALIUM) 5 MG tablet Take one tab at bed time 06/29/20   Zena Amos, MD  doxycycline (VIBRAMYCIN) 100 MG capsule Take 1 capsule (100 mg total) by mouth 2 (two) times daily for 7  days. 08/25/20 09/01/20  Couture, Cortni S, PA-C  famotidine (PEPCID) 20 MG tablet Take 20 mg by mouth daily as needed for heartburn.     [provider]  HYDROcodone-acetaminophen (NORCO/VICODIN) 5-325 MG tablet Take 1 tablet by mouth every 6 (six) hours as needed. 08/25/20   Couture, Cortni S, PA-C  levocetirizine (XYZAL) 5 MG tablet Take 5 mg by mouth every evening.    [provider]  Misc Natural Products (GINSENG-AMERICAN PO) Take by mouth.    [provider]  propranolol (INDERAL) 20 MG tablet Take 1 tablet (20 mg total) by mouth 2 (two) times daily. 06/29/20   Zena Amos, MD  sertraline (ZOLOFT) 100 MG tablet Take 1.5 tablets (150 mg total) by mouth daily. 06/29/20   Zena Amos, MD  traZODone (DESYREL) 50 MG tablet Take 1 tablet (50 mg total) by mouth at bedtime. 06/29/20   Zena Amos, MD    Allergies    Serzone [nefazodone], Nsaids, and Lamictal [lamotrigine]  Review of Systems   Review of Systems  All other systems reviewed and are negative.   Physical Exam Updated Vital Signs BP (!) 152/97 (BP Location: Left Arm)   Pulse 77   Temp 98.5 F (36.9 C) (Oral)   Resp 16   SpO2 100%   Physical Exam Vitals and nursing note reviewed.  Constitutional:      General: She is not in acute distress.    Appearance: She is well-developed.  HENT:     Head: Normocephalic and atraumatic.  Eyes:     Conjunctiva/sclera: Conjunctivae normal.     Pupils: Pupils are equal, round, and reactive to light.  Cardiovascular:     Rate and Rhythm: Normal rate and regular rhythm.     Heart sounds: Normal heart sounds.  Pulmonary:     Effort: Pulmonary effort is normal. No respiratory distress.     Breath sounds: Normal breath sounds.  Abdominal:     General: There is no distension.     Palpations: Abdomen is soft.     Tenderness: There is no abdominal tenderness.  Musculoskeletal:        General: No deformity. Normal range of motion.     Cervical back: Normal  range of motion and neck supple.  Skin:    General: Skin is warm and dry.     Comments: Healing I&D  site on the right proximal medial thigh.  No evidence of recurrent abscess or cellulitis.  Neurological:     Mental Status: She is alert and oriented to person, place, and time.     ED Results / Procedures / Treatments   Labs (all labs ordered are listed, but only abnormal results are displayed) Labs Reviewed - No data to display  EKG None  Radiology No results found.  Procedures Procedures (including critical care time)  Medications Ordered in ED Medications - No data to display  ED Course  I have reviewed the triage vital signs and the nursing notes.  Pertinent labs & imaging results that were available during my care of the patient were reviewed by me and considered in my medical decision making (see chart for details).    MDM Rules/Calculators/A&P                           MDM  Screen complete  Sandra Herring was evaluated in Emergency Department on 08/30/2020 for the symptoms described in the history of present illness. She was evaluated in the context of the global COVID-19 pandemic, which necessitated consideration that the patient might be at risk for infection with the SARS-CoV-2 virus that causes COVID-19. Institutional protocols and algorithms that pertain to the evaluation of patients at risk for COVID-19 are in a state of rapid change based on information released by regulatory bodies including the CDC and federal and state organizations. These policies and algorithms were followed during the patient's care in the ED.   Patient is presenting today for wound check.  Patient's I&D site on the right medial proximal thigh is in proving.  There is no evidence of recurrent abscess.  There is no evidence of cellulitis on exam.  Patient is compliant with prescribed doxycycline.  Patient is describing appropriate home care for her I&D.  Importance of close  follow-up is stressed.  Strict return precautions given and understood. Final Clinical Impression(s) / ED Diagnoses Final diagnoses:  Visit for wound check    Rx / DC Orders ED Discharge Orders    None       Wynetta Fines, MD 08/30/20 1015

## 2020-09-04 ENCOUNTER — Ambulatory Visit (INDEPENDENT_AMBULATORY_CARE_PROVIDER_SITE_OTHER): Payer: No Payment, Other | Admitting: Licensed Clinical Social Worker

## 2020-09-04 ENCOUNTER — Other Ambulatory Visit: Payer: Self-pay

## 2020-09-04 DIAGNOSIS — F418 Other specified anxiety disorders: Secondary | ICD-10-CM

## 2020-09-05 ENCOUNTER — Ambulatory Visit (HOSPITAL_COMMUNITY): Payer: Self-pay | Admitting: Licensed Clinical Social Worker
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
## 2020-09-12 NOTE — Progress Notes (Deleted)
Patient ID: Sandra Herring, female   DOB: 17-Jun-1961, 59 y.o.   MRN: [id number]   After ED visit for I&D then follow-up 08/30/2020  Patient is presenting today for wound check.  Patient's I&D site on the right medial proximal thigh is in proving.  There is no evidence of recurrent abscess.  There is no evidence of cellulitis on exam.  Patient is compliant with prescribed doxycycline.  Patient is describing appropriate home care for her I&D.

## 2020-09-13 ENCOUNTER — Inpatient Hospital Stay: Payer: Self-pay | Admitting: Physician Assistant

## 2020-09-26 ENCOUNTER — Encounter (HOSPITAL_COMMUNITY): Payer: Self-pay | Admitting: Psychiatry

## 2020-09-26 ENCOUNTER — Other Ambulatory Visit: Payer: Self-pay

## 2020-09-26 ENCOUNTER — Telehealth (INDEPENDENT_AMBULATORY_CARE_PROVIDER_SITE_OTHER): Payer: No Payment, Other | Admitting: Psychiatry

## 2020-09-26 DIAGNOSIS — F4310 Post-traumatic stress disorder, unspecified: Secondary | ICD-10-CM

## 2020-09-26 DIAGNOSIS — F411 Generalized anxiety disorder: Secondary | ICD-10-CM | POA: Diagnosis not present

## 2020-09-26 DIAGNOSIS — F3341 Major depressive disorder, recurrent, in partial remission: Secondary | ICD-10-CM | POA: Diagnosis not present

## 2020-09-26 MED ORDER — PROPRANOLOL HCL 20 MG PO TABS: 20.0000 mg | ORAL_TABLET | Freq: Two times a day (BID) | ORAL | 1 refills | Status: DC

## 2020-09-26 MED ORDER — SERTRALINE HCL 100 MG PO TABS: 150.0000 mg | ORAL_TABLET | Freq: Every day | ORAL | 1 refills | Status: DC

## 2020-09-26 MED ORDER — BUPROPION HCL 75 MG PO TABS: 75.0000 mg | ORAL_TABLET | Freq: Two times a day (BID) | ORAL | 1 refills | Status: DC

## 2020-09-26 MED ORDER — TRAZODONE HCL 50 MG PO TABS: 50.0000 mg | ORAL_TABLET | Freq: Every day | ORAL | 1 refills | Status: DC
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
## 2020-10-04 ENCOUNTER — Telehealth (HOSPITAL_COMMUNITY): Payer: Self-pay | Admitting: *Deleted

## 2020-10-04 DIAGNOSIS — F4310 Post-traumatic stress disorder, unspecified: Secondary | ICD-10-CM

## 2020-10-04 DIAGNOSIS — F411 Generalized anxiety disorder: Secondary | ICD-10-CM

## 2020-10-04 MED ORDER — DIAZEPAM 5 MG PO TABS: ORAL_TABLET | ORAL | 2 refills | Status: DC

## 2020-10-04 NOTE — Telephone Encounter (Signed)
Rx sent with 2 refills.  

## 2020-10-04 NOTE — Addendum Note (Signed)
Addended by: Zena Amos on: 10/04/2020 02:20 PM   Modules accepted: Orders

## 2020-10-04 NOTE — Telephone Encounter (Signed)
VM left for writer she is needing her Valium and there is not a refill or rx on file for her. She is asking for "at least two refills" Will bring patient request to Dr Magdalen Spatz attention.

## 2020-10-08 ENCOUNTER — Telehealth (HOSPITAL_COMMUNITY): Payer: Self-pay | Admitting: *Deleted

## 2020-10-08 ENCOUNTER — Ambulatory Visit (INDEPENDENT_AMBULATORY_CARE_PROVIDER_SITE_OTHER): Payer: No Payment, Other | Admitting: Licensed Clinical Social Worker

## 2020-10-08 ENCOUNTER — Other Ambulatory Visit: Payer: Self-pay

## 2020-10-08 DIAGNOSIS — F418 Other specified anxiety disorders: Secondary | ICD-10-CM

## 2020-10-08 DIAGNOSIS — F411 Generalized anxiety disorder: Secondary | ICD-10-CM

## 2020-10-08 DIAGNOSIS — F4310 Post-traumatic stress disorder, unspecified: Secondary | ICD-10-CM

## 2020-10-08 MED ORDER — DIAZEPAM 5 MG PO TABS: ORAL_TABLET | ORAL | 2 refills | Status: DC

## 2020-10-08 NOTE — Telephone Encounter (Signed)
Done

## 2020-10-08 NOTE — Telephone Encounter (Signed)
VM X 2 from patient requesting we call her Diazepam to the [facility] on [address]. We called the other rxs to [facility] but her Diazepam went to [facility]. Will ask Dr Evelene Croon to move it and remove the [facility] from her preferred list. She stated the [facility] pharmacy is much cheaper.

## 2020-10-08 NOTE — Addendum Note (Signed)
Addended by: Zena Amos on: 10/08/2020 12:59 PM   Modules accepted: Orders

## 2020-10-09 NOTE — Progress Notes (Signed)
   THERAPIST PROGRESS NOTE  Virtual Visit via Video Note  I connected with Sandra Herring on 10/08/20 at  4:00 PM EST by a video enabled telemedicine application and verified that I am speaking with the correct person using two identifiers.  Location: Patient: Home Provider: [facility]   I discussed the limitations of evaluation and management by telemedicine and the availability of in person appointments. The patient expressed understanding and agreed to proceed. I discussed the assessment and treatment plan with the patient. The patient was provided an opportunity to ask questions and all were answered. The patient agreed with the plan and demonstrated an understanding of the instructions.   I provided 56 minutes of non-face-to-face time during this encounter.  Participation Level: Active  Behavioral Response: CasualAlertAnxious and Depressed  Type of Therapy: Individual Therapy  Treatment Goals addressed: Communication: Anxiety/Depression/Coping  Interventions: Motivational Interviewing and Supportive  Summary: Sandra Herring is a 59 y.o. female who presents with hx of anx/dep. This date pt signs on for video session per her preference. This date pt continues to present as very anxious with limited eye contact and intermittent difficulty with thought articulation. Pt reports she is being seen weekly at the [facility] for Cognitive Processing Therapy to address trauma by Anastasio Champion. She states she is feeling overwhelmed by situation. She states she is supposed to be writing about each trauma from past each day but they only talk about one of the events. She provides additional details and concerns. Reviewed options. Pt states she believes she will back down to q other wk and still needs someone to talk to about her daily coping with life stressors. Pt states she hopes she can see this clinician 1xmon. LCSW provided updates on scheduling including limitations and provided info on  current walk in option. Pt verbalizes understanding. LCSW assessed for status of the clinician who was going to assist her son with addiction problems and be accessible to her for help 24/7 per pt report last session. Pt states "All that changed". She states son nor self are getting active support. Pt states her son is currently homeless living in a tent in the woods. Pt reports she did meet son recently at a restaurant to buy him a Child psychotherapist. She advises as far as she knows son went into another detox inpatient stay yesterday. Pt struggling to cope with estrangement from both of her children. She reports a new stressor of trying to help her sister. Sister also has MH challenges resulting in sister removing her toe nails. Pt states she now has osteomyelitis and may loose her foot. Pt also reports new stressor r/t cat, Maxx, who she has been forced to keep indoors d/t neigh reporting issues with the cat to apartment complex. LCSW assisted to process all thoughts/feelings/stressors. Remainder of session spent addressing coping skills. Pt states she has started painting again which is meaningful to her. She continues to rely heavily on her faith/spirituality. LCSW reviewed poc with pt's verbal acceptance prior to close of session. Pt states appreciation for care.   Suicidal/Homicidal: Nowithout intent/plan  Therapist Response: Pt remains receptive to care.  Plan: Return again in ~ 4 weeks.  Diagnosis: Axis I: Anxiety with depression    Axis II: Deferred  [place] Sink, LCSW 10/09/2020

## 2020-10-10 ENCOUNTER — Telehealth: Payer: Self-pay

## 2020-10-10 NOTE — Telephone Encounter (Signed)
Called patient and LVM letting her know that her appointment with Joaquin Courts for 12/23 has been cancelled and needs to be rescheduled with another provider. Advised patient to call back [phone] to schedule or can go to mobile unit.

## 2020-10-11 ENCOUNTER — Inpatient Hospital Stay: Payer: Self-pay | Admitting: Physician Assistant

## 2020-10-16 ENCOUNTER — Ambulatory Visit (INDEPENDENT_AMBULATORY_CARE_PROVIDER_SITE_OTHER): Payer: Self-pay | Admitting: Primary Care

## 2020-10-16 ENCOUNTER — Other Ambulatory Visit: Payer: Self-pay

## 2020-10-16 ENCOUNTER — Encounter (INDEPENDENT_AMBULATORY_CARE_PROVIDER_SITE_OTHER): Payer: Self-pay | Admitting: Primary Care

## 2020-10-16 VITALS — BP 150/89 | HR 88 | Temp 97.5°F | Ht 69.0 in | Wt 260.2 lb

## 2020-10-16 DIAGNOSIS — G43709 Chronic migraine without aura, not intractable, without status migrainosus: Secondary | ICD-10-CM

## 2020-10-16 DIAGNOSIS — Z6838 Body mass index (BMI) 38.0-38.9, adult: Secondary | ICD-10-CM

## 2020-10-16 DIAGNOSIS — E6609 Other obesity due to excess calories: Secondary | ICD-10-CM

## 2020-10-16 DIAGNOSIS — F3341 Major depressive disorder, recurrent, in partial remission: Secondary | ICD-10-CM

## 2020-10-16 DIAGNOSIS — H543 Unqualified visual loss, both eyes: Secondary | ICD-10-CM

## 2020-10-16 DIAGNOSIS — R030 Elevated blood-pressure reading, without diagnosis of hypertension: Secondary | ICD-10-CM

## 2020-10-16 DIAGNOSIS — Z7689 Persons encountering health services in other specified circumstances: Secondary | ICD-10-CM
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
## 2020-10-18 ENCOUNTER — Inpatient Hospital Stay: Payer: Self-pay | Admitting: Family Medicine

## 2020-10-31 ENCOUNTER — Telehealth (INDEPENDENT_AMBULATORY_CARE_PROVIDER_SITE_OTHER): Payer: Self-pay | Admitting: Primary Care

## 2020-10-31 ENCOUNTER — Other Ambulatory Visit: Payer: Self-pay

## 2020-10-31 ENCOUNTER — Other Ambulatory Visit (INDEPENDENT_AMBULATORY_CARE_PROVIDER_SITE_OTHER): Payer: Self-pay | Admitting: Primary Care

## 2020-10-31 ENCOUNTER — Ambulatory Visit (INDEPENDENT_AMBULATORY_CARE_PROVIDER_SITE_OTHER): Payer: No Payment, Other | Admitting: Licensed Clinical Social Worker

## 2020-10-31 DIAGNOSIS — F418 Other specified anxiety disorders: Secondary | ICD-10-CM | POA: Diagnosis not present

## 2020-10-31 DIAGNOSIS — H6690 Otitis media, unspecified, unspecified ear: Secondary | ICD-10-CM

## 2020-10-31 MED ORDER — AMOXICILLIN-POT CLAVULANATE 875-125 MG PO TABS: 1.0000 | ORAL_TABLET | Freq: Two times a day (BID) | ORAL | 0 refills | Status: DC

## 2020-10-31 MED ORDER — FLUCONAZOLE 150 MG PO TABS: 150.0000 mg | ORAL_TABLET | Freq: Once | ORAL | 0 refills | Status: AC

## 2020-10-31 NOTE — Telephone Encounter (Signed)
Pt saw michelle on 10-16-2020 per pt michelle looked in her right ear and it was red and per pt michelle told her if they starting bothering her she would seen something to pharm. Pt is calling back and her right ear feels like something is in the ear. The right ear is painful. Pt states she can tell she has ear infection.  Please advise. [facility] on [address]
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
## 2020-11-01 NOTE — Telephone Encounter (Signed)
Per DPR; left voicemail notifying patient that abx has been sent for ear infection. Along with diflucan in the event she develops a yeast infection. If no improvement schedule follow up in 2 weeks. Return call to [phone] with any questions or concerns.

## 2020-11-20 ENCOUNTER — Telehealth (INDEPENDENT_AMBULATORY_CARE_PROVIDER_SITE_OTHER): Payer: No Payment, Other | Admitting: Psychiatry

## 2020-11-20 ENCOUNTER — Other Ambulatory Visit: Payer: Self-pay

## 2020-11-20 ENCOUNTER — Encounter (HOSPITAL_COMMUNITY): Payer: Self-pay | Admitting: Psychiatry

## 2020-11-20 DIAGNOSIS — F603 Borderline personality disorder: Secondary | ICD-10-CM | POA: Diagnosis not present

## 2020-11-20 DIAGNOSIS — F411 Generalized anxiety disorder: Secondary | ICD-10-CM | POA: Diagnosis not present

## 2020-11-20 DIAGNOSIS — F4310 Post-traumatic stress disorder, unspecified: Secondary | ICD-10-CM | POA: Diagnosis not present

## 2020-11-20 DIAGNOSIS — F3341 Major depressive disorder, recurrent, in partial remission: Secondary | ICD-10-CM

## 2020-11-20 MED ORDER — TRAZODONE HCL 50 MG PO TABS: 50.0000 mg | ORAL_TABLET | Freq: Every day | ORAL | 1 refills | Status: DC

## 2020-11-20 MED ORDER — SERTRALINE HCL 100 MG PO TABS: 150.0000 mg | ORAL_TABLET | Freq: Every day | ORAL | 1 refills | Status: DC

## 2020-11-20 MED ORDER — DIAZEPAM 5 MG PO TABS: ORAL_TABLET | ORAL | 1 refills | Status: DC

## 2020-11-20 MED ORDER — PROPRANOLOL HCL 20 MG PO TABS: 20.0000 mg | ORAL_TABLET | Freq: Two times a day (BID) | ORAL | 1 refills | Status: DC
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
## 2020-11-23 ENCOUNTER — Other Ambulatory Visit: Payer: Self-pay

## 2020-11-23 ENCOUNTER — Ambulatory Visit (HOSPITAL_COMMUNITY): Payer: No Payment, Other | Admitting: Licensed Clinical Social Worker

## 2020-12-24 ENCOUNTER — Other Ambulatory Visit: Payer: Self-pay

## 2020-12-24 ENCOUNTER — Ambulatory Visit (INDEPENDENT_AMBULATORY_CARE_PROVIDER_SITE_OTHER): Payer: No Payment, Other | Admitting: Licensed Clinical Social Worker

## 2020-12-24 DIAGNOSIS — F418 Other specified anxiety disorders: Secondary | ICD-10-CM | POA: Diagnosis not present
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
## 2021-01-10 ENCOUNTER — Ambulatory Visit (INDEPENDENT_AMBULATORY_CARE_PROVIDER_SITE_OTHER): Payer: No Payment, Other | Admitting: Licensed Clinical Social Worker

## 2021-01-10 ENCOUNTER — Other Ambulatory Visit: Payer: Self-pay

## 2021-01-10 DIAGNOSIS — F418 Other specified anxiety disorders: Secondary | ICD-10-CM
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
## 2021-01-16 ENCOUNTER — Telehealth (INDEPENDENT_AMBULATORY_CARE_PROVIDER_SITE_OTHER): Payer: No Payment, Other | Admitting: Psychiatry

## 2021-01-16 ENCOUNTER — Encounter (HOSPITAL_COMMUNITY): Payer: Self-pay | Admitting: Psychiatry

## 2021-01-16 ENCOUNTER — Other Ambulatory Visit: Payer: Self-pay

## 2021-01-16 DIAGNOSIS — F603 Borderline personality disorder: Secondary | ICD-10-CM

## 2021-01-16 DIAGNOSIS — F3341 Major depressive disorder, recurrent, in partial remission: Secondary | ICD-10-CM

## 2021-01-16 DIAGNOSIS — F411 Generalized anxiety disorder: Secondary | ICD-10-CM | POA: Diagnosis not present

## 2021-01-16 DIAGNOSIS — F4310 Post-traumatic stress disorder, unspecified: Secondary | ICD-10-CM

## 2021-01-16 MED ORDER — TRAZODONE HCL 50 MG PO TABS: 50.0000 mg | ORAL_TABLET | Freq: Every day | ORAL | 1 refills | Status: DC

## 2021-01-16 MED ORDER — DIAZEPAM 5 MG PO TABS: ORAL_TABLET | ORAL | 1 refills | Status: DC

## 2021-01-16 MED ORDER — SERTRALINE HCL 100 MG PO TABS: 150.0000 mg | ORAL_TABLET | Freq: Every day | ORAL | 1 refills | Status: DC

## 2021-01-16 MED ORDER — PROPRANOLOL HCL 20 MG PO TABS: 20.0000 mg | ORAL_TABLET | Freq: Two times a day (BID) | ORAL | 1 refills | Status: DC
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
## 2021-02-05 ENCOUNTER — Ambulatory Visit (INDEPENDENT_AMBULATORY_CARE_PROVIDER_SITE_OTHER): Payer: No Payment, Other | Admitting: Licensed Clinical Social Worker

## 2021-02-05 ENCOUNTER — Other Ambulatory Visit: Payer: Self-pay

## 2021-02-05 DIAGNOSIS — F418 Other specified anxiety disorders: Secondary | ICD-10-CM | POA: Diagnosis not present
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
## 2021-02-25 ENCOUNTER — Telehealth (HOSPITAL_COMMUNITY): Payer: Self-pay | Admitting: Licensed Clinical Social Worker

## 2021-02-25 NOTE — Telephone Encounter (Signed)
Patient called requesting Marybelle Killings LCSW return a call to her today. Pt [phone].

## 2021-03-13 ENCOUNTER — Telehealth (HOSPITAL_COMMUNITY): Payer: Self-pay | Admitting: Licensed Clinical Social Worker

## 2021-03-13 NOTE — Telephone Encounter (Signed)
This clinician just seeing message sent 05/02 by new admin staff. Was not previously accustomed to getting messages this way. LCSW returned call to pt. Pt's vm picks up. LCSW left detailed message re purpose of call and apology for delay. LCSW advised of next appt on 5/24.

## 2021-03-14 ENCOUNTER — Telehealth (INDEPENDENT_AMBULATORY_CARE_PROVIDER_SITE_OTHER): Payer: No Payment, Other | Admitting: Psychiatry

## 2021-03-14 ENCOUNTER — Other Ambulatory Visit: Payer: Self-pay

## 2021-03-14 DIAGNOSIS — F4310 Post-traumatic stress disorder, unspecified: Secondary | ICD-10-CM

## 2021-03-14 DIAGNOSIS — F3341 Major depressive disorder, recurrent, in partial remission: Secondary | ICD-10-CM

## 2021-03-14 DIAGNOSIS — F411 Generalized anxiety disorder: Secondary | ICD-10-CM

## 2021-03-14 DIAGNOSIS — F603 Borderline personality disorder: Secondary | ICD-10-CM

## 2021-03-14 MED ORDER — TRAZODONE HCL 50 MG PO TABS: 50.0000 mg | ORAL_TABLET | Freq: Every day | ORAL | 0 refills | Status: DC

## 2021-03-14 MED ORDER — PROPRANOLOL HCL 20 MG PO TABS: 20.0000 mg | ORAL_TABLET | Freq: Two times a day (BID) | ORAL | 0 refills | Status: DC

## 2021-03-14 MED ORDER — SERTRALINE HCL 100 MG PO TABS: 150.0000 mg | ORAL_TABLET | Freq: Every day | ORAL | 0 refills | Status: DC

## 2021-03-14 MED ORDER — DIAZEPAM 5 MG PO TABS: ORAL_TABLET | ORAL | 1 refills | Status: DC
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
## 2021-03-15 ENCOUNTER — Encounter (HOSPITAL_COMMUNITY): Payer: Self-pay | Admitting: Psychiatry

## 2021-03-19 ENCOUNTER — Ambulatory Visit (INDEPENDENT_AMBULATORY_CARE_PROVIDER_SITE_OTHER): Payer: No Payment, Other | Admitting: Licensed Clinical Social Worker

## 2021-03-19 ENCOUNTER — Other Ambulatory Visit: Payer: Self-pay

## 2021-03-19 DIAGNOSIS — F418 Other specified anxiety disorders: Secondary | ICD-10-CM | POA: Diagnosis not present

## 2021-03-29 ENCOUNTER — Ambulatory Visit (INDEPENDENT_AMBULATORY_CARE_PROVIDER_SITE_OTHER): Payer: No Payment, Other | Admitting: Licensed Clinical Social Worker

## 2021-03-29 DIAGNOSIS — F418 Other specified anxiety disorders: Secondary | ICD-10-CM | POA: Diagnosis not present
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
## 2021-04-08 ENCOUNTER — Telehealth (HOSPITAL_COMMUNITY): Payer: Self-pay | Admitting: Psychiatry

## 2021-04-08 NOTE — Telephone Encounter (Signed)
Patient called requesting to speak to provider regarding a difficult even that happened this week. Please return call to pt.
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
## 2021-04-09 ENCOUNTER — Other Ambulatory Visit: Payer: Self-pay

## 2021-04-09 ENCOUNTER — Ambulatory Visit: Payer: Self-pay | Admitting: Physician Assistant

## 2021-04-09 VITALS — BP 133/97 | HR 73 | Temp 98.2°F | Resp 18 | Ht 68.0 in | Wt 254.0 lb

## 2021-04-09 DIAGNOSIS — Z6981 Encounter for mental health services for victim of other abuse: Secondary | ICD-10-CM | POA: Insufficient documentation

## 2021-04-09 DIAGNOSIS — T71193A Asphyxiation due to mechanical threat to breathing due to other causes, assault, initial encounter: Secondary | ICD-10-CM

## 2021-04-09 DIAGNOSIS — H5711 Ocular pain, right eye: Secondary | ICD-10-CM | POA: Insufficient documentation

## 2021-04-09 DIAGNOSIS — F411 Generalized anxiety disorder: Secondary | ICD-10-CM

## 2021-04-09 DIAGNOSIS — F4310 Post-traumatic stress disorder, unspecified: Secondary | ICD-10-CM

## 2021-04-09 DIAGNOSIS — G44319 Acute post-traumatic headache, not intractable: Secondary | ICD-10-CM

## 2021-04-09 DIAGNOSIS — R519 Headache, unspecified: Secondary | ICD-10-CM | POA: Insufficient documentation

## 2021-04-09 DIAGNOSIS — F603 Borderline personality disorder: Secondary | ICD-10-CM

## 2021-04-09 MED ORDER — ACETAMINOPHEN 325 MG PO TABS
650.0000 mg | ORAL_TABLET | Freq: Once | ORAL | Status: AC
  Administered 2021-04-09: 650 mg via ORAL

## 2021-04-09 NOTE — Assessment & Plan Note (Signed)
CT scan of the head has been ordered and scheduled. Red flag signs and symptoms education and precautions provided.  Follow up with mobile unit if concerns.
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
## 2021-04-09 NOTE — Assessment & Plan Note (Signed)
Discussed can take Tylenol as directed for headache. CT scan ordered and scheduled. Advised to go to emergency department if symptoms worsen to have evaluation and CT scan performed earlier.

## 2021-04-09 NOTE — Assessment & Plan Note (Signed)
Advised to keep appointment with [facility] tomorrow. Red flag signs and symptoms education and precautions provided.  Follow up with mobile unit if concerns.
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
## 2021-04-10 ENCOUNTER — Ambulatory Visit (HOSPITAL_COMMUNITY): Payer: Self-pay | Admitting: Licensed Clinical Social Worker

## 2021-04-16 ENCOUNTER — Other Ambulatory Visit: Payer: Self-pay

## 2021-04-16 ENCOUNTER — Ambulatory Visit (HOSPITAL_COMMUNITY)
Admission: RE | Admit: 2021-04-16 | Discharge: 2021-04-16 | Disposition: A | Discharge status: Home / Self Care | Payer: Self-pay | Source: Ambulatory Visit | Attending: Physician Assistant | Admitting: Physician Assistant

## 2021-04-16 DIAGNOSIS — G44319 Acute post-traumatic headache, not intractable: Secondary | ICD-10-CM | POA: Insufficient documentation

## 2021-04-16 DIAGNOSIS — T71193A Asphyxiation due to mechanical threat to breathing due to other causes, assault, initial encounter: Secondary | ICD-10-CM | POA: Insufficient documentation

## 2021-04-16 DIAGNOSIS — H5711 Ocular pain, right eye: Secondary | ICD-10-CM | POA: Insufficient documentation

## 2021-04-23 ENCOUNTER — Other Ambulatory Visit: Payer: Self-pay

## 2021-04-23 ENCOUNTER — Telehealth (INDEPENDENT_AMBULATORY_CARE_PROVIDER_SITE_OTHER): Payer: No Payment, Other | Admitting: Psychiatry

## 2021-04-23 ENCOUNTER — Encounter (HOSPITAL_COMMUNITY): Payer: Self-pay | Admitting: Psychiatry

## 2021-04-23 DIAGNOSIS — F3341 Major depressive disorder, recurrent, in partial remission: Secondary | ICD-10-CM

## 2021-04-23 DIAGNOSIS — F603 Borderline personality disorder: Secondary | ICD-10-CM | POA: Diagnosis not present

## 2021-04-23 DIAGNOSIS — F411 Generalized anxiety disorder: Secondary | ICD-10-CM

## 2021-04-23 DIAGNOSIS — F4310 Post-traumatic stress disorder, unspecified: Secondary | ICD-10-CM | POA: Diagnosis not present

## 2021-04-23 MED ORDER — PROPRANOLOL HCL 20 MG PO TABS: 20.0000 mg | ORAL_TABLET | Freq: Two times a day (BID) | ORAL | 0 refills | Status: DC

## 2021-04-23 MED ORDER — DIAZEPAM 5 MG PO TABS: ORAL_TABLET | ORAL | 1 refills | Status: DC

## 2021-04-23 MED ORDER — SERTRALINE HCL 100 MG PO TABS: 150.0000 mg | ORAL_TABLET | Freq: Every day | ORAL | 0 refills | Status: DC

## 2021-04-23 MED ORDER — TRAZODONE HCL 50 MG PO TABS: 50.0000 mg | ORAL_TABLET | Freq: Every day | ORAL | 0 refills | Status: DC
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
## 2021-04-25 ENCOUNTER — Telehealth: Payer: Self-pay | Admitting: *Deleted

## 2021-04-25 NOTE — Telephone Encounter (Signed)
-----   Message from Roney Jaffe, [place] sent at 04/17/2021  4:01 PM EDT ----- Please call patient and let her know that the CAT scan of her head did not show any abnormality.

## 2021-04-25 NOTE — Telephone Encounter (Signed)
Patient verified DOB Patient is aware of CT being normal and to FU with [facility] for any continued concerns.

## 2021-04-30 ENCOUNTER — Telehealth (HOSPITAL_COMMUNITY): Payer: Self-pay | Admitting: Licensed Clinical Social Worker

## 2021-04-30 ENCOUNTER — Ambulatory Visit (HOSPITAL_COMMUNITY): Payer: No Payment, Other | Admitting: Licensed Clinical Social Worker

## 2021-04-30 NOTE — Telephone Encounter (Signed)
LCSW sent text link for video session per schedule. LCSW remainded avail online for session until 8:14am. Pt failed to sign on for session.

## 2021-05-01 ENCOUNTER — Ambulatory Visit (HOSPITAL_COMMUNITY): Payer: No Payment, Other | Admitting: Licensed Clinical Social Worker

## 2021-05-02 ENCOUNTER — Telehealth (HOSPITAL_COMMUNITY): Payer: Self-pay | Admitting: Licensed Clinical Social Worker

## 2021-05-02 NOTE — Telephone Encounter (Signed)
LCSW called pt to try to connect by phone after session earlier in the wk missed. Call went straight to vm. Left detailed message re purpose of call. Pt advised she is on cancellation list.

## 2021-05-22 ENCOUNTER — Ambulatory Visit (HOSPITAL_COMMUNITY): Payer: No Payment, Other | Admitting: Licensed Clinical Social Worker

## 2021-05-28 ENCOUNTER — Ambulatory Visit (INDEPENDENT_AMBULATORY_CARE_PROVIDER_SITE_OTHER): Payer: No Payment, Other | Admitting: Licensed Clinical Social Worker

## 2021-05-28 ENCOUNTER — Other Ambulatory Visit: Payer: Self-pay

## 2021-05-28 DIAGNOSIS — F332 Major depressive disorder, recurrent severe without psychotic features: Secondary | ICD-10-CM

## 2021-05-28 DIAGNOSIS — F411 Generalized anxiety disorder: Secondary | ICD-10-CM
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
## 2021-06-10 ENCOUNTER — Ambulatory Visit (INDEPENDENT_AMBULATORY_CARE_PROVIDER_SITE_OTHER): Payer: No Payment, Other | Admitting: Licensed Clinical Social Worker

## 2021-06-10 ENCOUNTER — Other Ambulatory Visit: Payer: Self-pay

## 2021-06-10 DIAGNOSIS — F418 Other specified anxiety disorders: Secondary | ICD-10-CM
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
## 2021-06-17 ENCOUNTER — Telehealth (HOSPITAL_COMMUNITY): Payer: Self-pay | Admitting: Licensed Clinical Social Worker

## 2021-06-17 NOTE — Telephone Encounter (Signed)
Patient called Sandra Herring stating something came up that she has to take care of and canceled appt for this week with therapy J.H.  Patient apologize and request we call her if cancellations any other time.

## 2021-06-21 ENCOUNTER — Telehealth (HOSPITAL_COMMUNITY): Payer: No Payment, Other | Admitting: Psychiatry

## 2021-06-24 ENCOUNTER — Other Ambulatory Visit: Payer: Self-pay

## 2021-06-24 ENCOUNTER — Ambulatory Visit (INDEPENDENT_AMBULATORY_CARE_PROVIDER_SITE_OTHER): Payer: No Payment, Other | Admitting: Psychiatry

## 2021-06-24 DIAGNOSIS — F4310 Post-traumatic stress disorder, unspecified: Secondary | ICD-10-CM

## 2021-06-24 DIAGNOSIS — F411 Generalized anxiety disorder: Secondary | ICD-10-CM

## 2021-06-24 DIAGNOSIS — F3341 Major depressive disorder, recurrent, in partial remission: Secondary | ICD-10-CM | POA: Diagnosis not present

## 2021-06-24 MED ORDER — SERTRALINE HCL 100 MG PO TABS: 150.0000 mg | ORAL_TABLET | Freq: Every day | ORAL | 3 refills | Status: DC

## 2021-06-24 MED ORDER — PROPRANOLOL HCL 20 MG PO TABS: 20.0000 mg | ORAL_TABLET | Freq: Two times a day (BID) | ORAL | 3 refills | Status: DC

## 2021-06-24 MED ORDER — TRAZODONE HCL 50 MG PO TABS: 50.0000 mg | ORAL_TABLET | Freq: Every day | ORAL | 3 refills | Status: DC
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
## 2021-07-12 ENCOUNTER — Ambulatory Visit (HOSPITAL_COMMUNITY): Payer: No Payment, Other | Admitting: Licensed Clinical Social Worker

## 2021-07-22 ENCOUNTER — Telehealth (INDEPENDENT_AMBULATORY_CARE_PROVIDER_SITE_OTHER): Payer: Self-pay | Admitting: Pediatrics

## 2021-07-22 NOTE — Telephone Encounter (Signed)
This pediatric office received two voicemail messages from Vira @ [facility] in reference to a faxed request for med records and disability form related to January 2022 (Incident # [id number]) "to be completed by Laqueta Linden,' at the [facility])."  Although this clinic ([facility]) is co-located with [facility] of the [place] & other community agencies within the [facility], our clinic is pediatric patients only, thus this is not a patient of ours.  Messages routed to [facility] front dest staff member, who advises that there is a staff member in the building named 'Laqueta Linden' who does temporarily work here on a limited basis and will return to this site on Thursday 07/25/21. Staff member advises she did give Leilani a fax recently, which may be the same paperwork referenced in voicemails.  This MD called Vira @ [facility] to advise re: same. Routing this note to more appropriate medical providers.

## 2021-07-26 ENCOUNTER — Telehealth (HOSPITAL_COMMUNITY): Payer: No Payment, Other | Admitting: Psychiatry

## 2021-07-29 ENCOUNTER — Other Ambulatory Visit: Payer: Self-pay

## 2021-07-29 ENCOUNTER — Telehealth: Payer: Self-pay | Admitting: Family Medicine

## 2021-07-29 ENCOUNTER — Ambulatory Visit: Payer: MEDICAID | Attending: Family Medicine | Admitting: Family Medicine

## 2021-07-29 ENCOUNTER — Encounter: Payer: Self-pay | Admitting: Family Medicine

## 2021-07-29 VITALS — BP 151/93 | HR 81 | Ht 68.0 in | Wt 259.0 lb

## 2021-07-29 DIAGNOSIS — F411 Generalized anxiety disorder: Secondary | ICD-10-CM

## 2021-07-29 DIAGNOSIS — H3322 Serous retinal detachment, left eye: Secondary | ICD-10-CM

## 2021-07-29 DIAGNOSIS — L989 Disorder of the skin and subcutaneous tissue, unspecified: Secondary | ICD-10-CM

## 2021-07-29 DIAGNOSIS — F603 Borderline personality disorder: Secondary | ICD-10-CM

## 2021-07-29 DIAGNOSIS — G629 Polyneuropathy, unspecified: Secondary | ICD-10-CM

## 2021-07-29 DIAGNOSIS — F4310 Post-traumatic stress disorder, unspecified: Secondary | ICD-10-CM

## 2021-07-29 DIAGNOSIS — I10 Essential (primary) hypertension: Secondary | ICD-10-CM | POA: Insufficient documentation

## 2021-07-29 LAB — POCT GLYCOSYLATED HEMOGLOBIN (HGB A1C): Hemoglobin A1C: 5.6 % (ref 4.0–5.6)

## 2021-07-29 MED ORDER — PROPRANOLOL HCL 40 MG PO TABS: 40.0000 mg | ORAL_TABLET | Freq: Two times a day (BID) | ORAL | 3 refills | Status: DC

## 2021-07-29 NOTE — Progress Notes (Signed)
Subjective:  Patient ID: Sandra Herring, female    DOB: 1961-10-02  Age: 60 y.o. MRN: [id number]  CC: Loss of Vision   HPI Sandra Herring is a 60 y.o. year old female with a history of anxiety and depression, PTSD, borderline personality disorder, THC abuse. She was previously followed by Dr. Evelene Croon with her last visit in 03/2021.  Notes reviewed and at that visit she was informed to transfer her care to a different clinician as her psychiatrist will be leaving the practice.  Interval History: She had since established with [place] presents NP and her prescriptions refilled; last visit was in 06/24/2021. She has problems with word finding. States she would like me to document everything she has to say as this would help her disability case.  She had retinal detachment in her L eye and also had cataract surgery in her L eye. Also had problems in her R eye and has had surgery. Her R eyes feels like it is foggy. She falls a lot and spills things. Drs Dione Booze and Dr Vanessa Barbara are her ophthalmologist. She has been out of work on disbility since 2020 due to her eyes and her Depression. 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 'She has skin lesions on her L breast and L arm' which she thinks is "skin cancer" Past Medical History:  Diagnosis Date   Anxiety    Blind    Depression    GERD (gastroesophageal reflux disease)    Headache    migraines takes toprol    PTSD (post-traumatic stress disorder)    PTSD (post-traumatic stress disorder)    Seizures (HCC)    febrile seizure at age 49 months   Thyroid nodule    Vaginal infection     Past Surgical History:  Procedure  Laterality Date   25 GAUGE PARS PLANA VITRECTOMY WITH 20 GAUGE MVR PORT FOR MACULAR HOLE Right 12/10/2017   Procedure: PARS PLANA VITRECTOMY WITH 25 GAUGE LAZER AND GAS MACULAR HOLE membrane pale gas;  Surgeon: Rennis Chris, MD;  Location: [facility] OR;  Service: Ophthalmology;  Laterality: Right;   COLONOSCOPY     DORSAL COMPARTMENT RELEASE Left 07/29/2019   Procedure: RELEASE DORSAL COMPARTMENT (DEQUERVAIN) LEFT WRIST;  Surgeon: Betha Loa, MD;  Location: [facility];  Service: Orthopedics;  Laterality: Left;   EYE SURGERY     Dr. Richardean Chimera   HERNIA REPAIR     lumbar     TONSILLECTOMY  2005   TUBAL LIGATION      Family History  Problem Relation Age of Onset   Cataracts Mother    Depression Mother    Cataracts Father    Depression Father    Cataracts Maternal Grandfather    Amblyopia Neg Hx    Blindness Neg Hx    Glaucoma Neg Hx    Diabetes Neg Hx    Macular degeneration Neg Hx    Retinal detachment Neg Hx    Strabismus Neg Hx    Retinitis pigmentosa Neg Hx     Allergies  Allergen Reactions   Serzone [Nefazodone] Shortness Of Breath    Breathing difficulty  Nsaids Other (See Comments)    gastritis   Lamictal [Lamotrigine] Itching and Rash    Outpatient Medications Prior to Visit  Medication Sig Dispense Refill   Ascorbic Acid (VITAMIN C) 1000 MG tablet Take 4,000 mg by mouth daily.     calcium carbonate (TUMS - DOSED IN MG ELEMENTAL CALCIUM) 500 MG chewable tablet Chew by mouth.     diazepam (VALIUM) 5 MG tablet Take one tab at bed time 30 tablet 1   famotidine (PEPCID) 20 MG tablet Take 20 mg by mouth daily as needed for heartburn.      Misc Natural Products (GINSENG-AMERICAN PO) Take by mouth.     propranolol (INDERAL) 20 MG tablet Take 1 tablet (20 mg total) by mouth 2 (two) times daily. 60 tablet 3   sertraline (ZOLOFT) 100 MG tablet Take 1.5 tablets (150 mg total) by mouth daily. 135 tablet 3   traZODone (DESYREL) 50 MG tablet Take 1 tablet (50 mg total)  by mouth at bedtime. 90 tablet 3   Cod Liver Oil OIL Take by mouth. (Patient not taking: Reported on 07/29/2021)     No facility-administered medications prior to visit.     ROS Review of Systems  Constitutional:  Negative for activity change, appetite change and fatigue.  HENT:  Negative for congestion, sinus pressure and sore throat.   Eyes:  Positive for visual disturbance.  Respiratory:  Negative for cough, chest tightness, shortness of breath and wheezing.   Cardiovascular:  Negative for chest pain and palpitations.  Gastrointestinal:  Negative for abdominal distention, abdominal pain and constipation.  Endocrine: Negative for polydipsia.  Genitourinary:  Negative for dysuria and frequency.  Musculoskeletal:  Positive for arthralgias and gait problem. Negative for back pain.  Skin:  Negative for rash.  Neurological:  Positive for numbness. Negative for tremors and light-headedness.  Hematological:  Does not bruise/bleed easily.  Psychiatric/Behavioral:  Negative for agitation and behavioral problems.    Objective:  BP (!) 151/93   Pulse 81   Ht 5\' 8"  (1.727 m)   Wt 259 lb (117.5 kg)   SpO2 96%   BMI 39.38 kg/m   BP/Weight 07/29/2021 06/24/2021 04/09/2021  Systolic BP 151 155 133  Diastolic BP 93 81 97  Wt. (Lbs) 259 255 254  BMI 39.38 38.77 38.62      Physical Exam Constitutional:      Appearance: She is well-developed.  Cardiovascular:     Rate and Rhythm: Normal rate.     Heart sounds: Normal heart sounds. No murmur heard. Pulmonary:     Effort: Pulmonary effort is normal.     Breath sounds: Normal breath sounds. No wheezing or rales.  Chest:     Chest wall: No tenderness.  Abdominal:     General: Bowel sounds are normal. There is no distension.     Palpations: Abdomen is soft. There is no mass.     Tenderness: There is no abdominal tenderness.  Musculoskeletal:        General: Normal range of motion.     Right lower leg: No edema.     Left lower leg: No  edema.     Comments: Normal handgrip bilaterally.  Skin:    Comments: Hyperpigmented lesion triceps of right which is elevated.  Flat papule on right breast.  Neurological:     Mental Status: She is alert and oriented to person, place, and time.  Psychiatric:        Mood and Affect: Mood normal.  CMP Latest Ref Rng & Units 05/12/2020 11/21/2019 12/10/2017  Glucose 70 - 99 mg/dL 810(F) 751(W) 258(N)  BUN 6 - 20 mg/dL 9 11 7   Creatinine 0.44 - 1.00 mg/dL 2.77 8.24  Sodium 135 - 145 mmol/L 135 141 139  Potassium 3.5 - 5.1 mmol/L 3.9 4.1 3.9  Chloride 98 - 111 mmol/L 102 103 105  CO2 22 - 32 mmol/L 22 25 23   Calcium 8.9 - 10.3 mg/dL 8.9 9.6 9.4  Total Protein 6.5 - 8.1 g/dL 6.9 - -  Total Bilirubin 0.3 - 1.2 mg/dL 0.4 - -  Alkaline Phos 38 - 126 U/L 78 - -  AST 15 - 41 U/L 14(L) - -  ALT 0 - 44 U/L 22 - -    Lipid Panel  No results found for: CHOL, TRIG, HDL, CHOLHDL, VLDL, LDLCALC, LDLDIRECT  CBC    Component Value Date/Time   WBC 10.3 05/12/2020 1124   RBC 5.09 05/12/2020 1124   HGB 14.3 05/12/2020 1124   HCT 44.5 05/12/2020 1124   PLT 252 05/12/2020 1124   MCV 87.4 05/12/2020 1124   MCH 28.1 05/12/2020 1124   MCHC 32.1 05/12/2020 1124   RDW 13.5 05/12/2020 1124   LYMPHSABS 1.5 05/12/2020 1124   MONOABS 0.5 05/12/2020 1124   EOSABS 0.2 05/12/2020 1124   BASOSABS 0.0 05/12/2020 1124    Lab Results  Component Value Date   HGBA1C 5.6 07/29/2021     Assessment & Plan:  1. Essential hypertension Uncontrolled Dose of propranolol increased  2. PTSD (post-traumatic stress disorder) Currently on Zoloft, trazodone, propranolol, Valium Followed by behavioral health  3. Borderline personality disorder (HCC) See #2 above  4. GAD (generalized anxiety disorder) Stable See #2 above - propranolol (INDERAL) 40 MG tablet; Take 1 tablet (40 mg total) by mouth 2 (two) times daily.  Dispense: 90 tablet; Refill: 3  5. Neuropathy Uncontrolled Screen for diabetes is  normal with A1c of 5.3 We will check B12 level She declines initiation of gabapentin - Vitamin B12 - POCT glycosylated hemoglobin (Hb A1C)  6. Skin lesions Patient is concerned she might have a malignancy I have referred her accordingly - Ambulatory referral to Dermatology  7. Left retinal detachment Status postsurgery Followed by ophthalmology    No orders of the defined types were placed in this encounter.   Return in about 3 months (around 10/29/2021) for medical conditions.       09/28/2021, MD, FAAFP. [facility] [place], [facility] [place]   07/29/2021, 2:24 PM

## 2021-07-29 NOTE — Patient Instructions (Signed)
Neuropathic Pain Neuropathic pain is pain caused by damage to the nerves that are responsible for certain sensations in your body (sensory nerves). The pain can be caused by: Damage to the sensory nerves that send signals to your spinal cord and brain (peripheral nervous system). Damage to the sensory nerves in your brain or spinal cord (central nervous system). Neuropathic pain can make you more sensitive to pain. Even a minor sensation can feel very painful. This is usually a long-term condition that can be difficult to treat. The type of pain differs from person to person. It may: Start suddenly (acute), or it may develop slowly and last for a long time (chronic). Come and go as damaged nerves heal, or it may stay at the same level for years. Cause emotional distress, loss of sleep, and a lower quality of life. What are the causes? The most common cause of this condition is diabetes. Many other diseases and conditions can also cause neuropathic pain. Causes of neuropathic pain can be classified as: Toxic. This is caused by medicines and chemicals. The most common cause of toxic neuropathic pain is damage from cancer treatments (chemotherapy). Metabolic. This can be caused by: Diabetes. This is the most common disease that damages the nerves. Lack of vitamin B from long-term alcohol abuse. Traumatic. Any injury that cuts, crushes, or stretches a nerve can cause damage and pain. A common example is feeling pain after losing an arm or leg (phantom limb pain). Compression-related. If a sensory nerve gets trapped or compressed for a long period of time, the blood supply to the nerve can be cut off. Vascular. Many blood vessel diseases can cause neuropathic pain by decreasing blood supply and oxygen to nerves. Autoimmune. This type of pain results from diseases in which the body's defense system (immune system) mistakenly attacks sensory nerves. Examples of autoimmune diseases that can cause  neuropathic pain include lupus and multiple sclerosis. Infectious. Many types of viral infections can damage sensory nerves and cause pain. Shingles infection is a common cause of this type of pain. Inherited. Neuropathic pain can be a symptom of many diseases that are passed down through families (genetic). What increases the risk? You are more likely to develop this condition if: You have diabetes. You smoke. You drink too much alcohol. You are taking certain medicines, including medicines that kill cancer cells (chemotherapy) or that treat immune system disorders. What are the signs or symptoms? The main symptom is pain. Neuropathic pain is often described as: Burning. Shock-like. Stinging. Hot or cold. Itching. How is this diagnosed? No single test can diagnose neuropathic pain. It is diagnosed based on: Physical exam and your symptoms. Your health care provider will ask you about your pain. You may be asked to use a pain scale to describe how bad your pain is. Tests. These may be done to see if you have a high sensitivity to pain and to help find the cause and location of any sensory nerve damage. They include: Nerve conduction studies to test how well nerve signals travel through your sensory nerves (electrodiagnostic testing). Stimulating your sensory nerves through electrodes on your skin and measuring the response in your spinal cord and brain (somatosensory evoked potential). Imaging studies, such as: X-rays. CT scan. MRI. How is this treated? Treatment for neuropathic pain may change over time. You may need to try different treatment options or a combination of treatments. Some options include: Treating the underlying cause of the neuropathy, such as diabetes, kidney disease, or vitamin   deficiencies. Stopping medicines that can cause neuropathy, such as chemotherapy. Medicine to relieve pain. Medicines may include: Prescription or over-the-counter pain  medicine. Anti-seizure medicine. Antidepressant medicines. Pain-relieving patches that are applied to painful areas of skin. A medicine to numb the area (local anesthetic), which can be injected as a nerve block. Transcutaneous nerve stimulation. This uses electrical currents to block painful nerve signals. The treatment is painless. Alternative treatments, such as: Acupuncture. Meditation. Massage. Physical therapy. Pain management programs. Counseling. Follow these instructions at home: Medicines  Take over-the-counter and prescription medicines only as told by your health care provider. Do not drive or use heavy machinery while taking prescription pain medicine. If you are taking prescription pain medicine, take actions to prevent or treat constipation. Your health care provider may recommend that you: Drink enough fluid to keep your urine pale yellow. Eat foods that are high in fiber, such as fresh fruits and vegetables, whole grains, and beans. Limit foods that are high in fat and processed sugars, such as fried or sweet foods. Take an over-the-counter or prescription medicine for constipation. Lifestyle  Have a good support system at home. Consider joining a chronic pain support group. Do not use any products that contain nicotine or tobacco, such as cigarettes and e-cigarettes. If you need help quitting, ask your health care provider. Do not drink alcohol. General instructions Learn as much as you can about your condition. Work closely with all your health care providers to find the treatment plan that works best for you. Ask your health care provider what activities are safe for you. Keep all follow-up visits as told by your health care provider. This is important. Contact a health care provider if: Your pain treatments are not working. You are having side effects from your medicines. You are struggling with tiredness (fatigue), mood changes, depression, or  anxiety. Summary Neuropathic pain is pain caused by damage to the nerves that are responsible for certain sensations in your body (sensory nerves). Neuropathic pain may come and go as damaged nerves heal, or it may stay at the same level for years. Neuropathic pain is usually a long-term condition that can be difficult to treat. Consider joining a chronic pain support group. This information is not intended to replace advice given to you by your health care provider. Make sure you discuss any questions you have with your health care provider. Document Revised: 02/03/2019 Document Reviewed: 10/30/2017 Elsevier Patient Education  2022 [employer] Inc.  

## 2021-07-29 NOTE — Telephone Encounter (Signed)
Patient needs housing resources as she is about to be homeless.  Please assist, thank you.

## 2021-07-30 ENCOUNTER — Telehealth: Payer: Self-pay

## 2021-07-30 LAB — VITAMIN B12: Vitamin B-12: 478 pg/mL (ref 232–1245)

## 2021-07-30 NOTE — Telephone Encounter (Signed)
I was able to speak with the patient on 07/30/2021 about her housing concerns. Verified patients name and date of birth. Patient shared her disability is up for review and is trying to be proactive in requesting assistance with rent if she disability is denied. Patient shares that she does not owe back rent and is not in the process of eviction papers being filed on her.   CM did share the resources we have available are to assist if patients need assistance with paying rent Kathreen Cornfield), are in the process of being legally or illegally evicted from their residence Education administrator) or experiencing homelessness Animal nutritionist). CM shared at this time none of these resources are able to benefit the patient. CM encouraged the patient to callback if any of these situations occurred. Patient shared she understood and shared she has faith that she wont have to callback for assistance.

## 2021-07-30 NOTE — Telephone Encounter (Signed)
Followed up with the patient on concerns for assistance with paying rent. Patient shared they were being proactive in requesting assistance and as of now they do not need resources from [facility]. CM encouraged the patient to reach out if assistance is needed in the future.

## 2021-08-02 ENCOUNTER — Ambulatory Visit (HOSPITAL_COMMUNITY): Payer: No Payment, Other | Admitting: Licensed Clinical Social Worker

## 2021-08-08 ENCOUNTER — Ambulatory Visit (INDEPENDENT_AMBULATORY_CARE_PROVIDER_SITE_OTHER): Payer: Self-pay | Admitting: Primary Care

## 2021-08-11 ENCOUNTER — Telehealth: Payer: Self-pay | Admitting: Emergency Medicine

## 2021-08-11 DIAGNOSIS — R051 Acute cough: Secondary | ICD-10-CM

## 2021-08-11 DIAGNOSIS — R0981 Nasal congestion: Secondary | ICD-10-CM

## 2021-08-11 DIAGNOSIS — J329 Chronic sinusitis, unspecified: Secondary | ICD-10-CM

## 2021-08-11 DIAGNOSIS — B9689 Other specified bacterial agents as the cause of diseases classified elsewhere: Secondary | ICD-10-CM

## 2021-08-11 MED ORDER — BENZONATATE 100 MG PO CAPS: 100.0000 mg | ORAL_CAPSULE | Freq: Two times a day (BID) | ORAL | 0 refills | Status: AC | PRN

## 2021-08-11 MED ORDER — AMOXICILLIN-POT CLAVULANATE 875-125 MG PO TABS: 1.0000 | ORAL_TABLET | Freq: Two times a day (BID) | ORAL | 0 refills | Status: AC

## 2021-08-11 NOTE — Patient Instructions (Signed)
Sandra Herring, thank you for joining Rennis Harding, PA-C for today's virtual visit.  While this provider is not your primary care provider (PCP), if your PCP is located in our provider database this encounter information will be shared with them immediately following your visit.  Consent: (Patient) Sandra Herring provided verbal consent for this virtual visit at the beginning of the encounter.  Current Medications:  Current Outpatient Medications:    amoxicillin-clavulanate (AUGMENTIN) 875-125 MG tablet, Take 1 tablet by mouth 2 (two) times daily., Disp: 20 tablet, Rfl: 0   benzonatate (TESSALON) 100 MG capsule, Take 1 capsule (100 mg total) by mouth 2 (two) times daily as needed for cough., Disp: 20 capsule, Rfl: 0   Ascorbic Acid (VITAMIN C) 1000 MG tablet, Take 4,000 mg by mouth daily., Disp: , Rfl:    calcium carbonate (TUMS - DOSED IN MG ELEMENTAL CALCIUM) 500 MG chewable tablet, Chew by mouth., Disp: , Rfl:    Cod Liver Oil OIL, Take by mouth., Disp: , Rfl:    diazepam (VALIUM) 5 MG tablet, Take one tab at bed time, Disp: 30 tablet, Rfl: 1   famotidine (PEPCID) 20 MG tablet, Take 20 mg by mouth daily as needed for heartburn. , Disp: , Rfl:    Misc Natural Products (GINSENG-AMERICAN PO), Take by mouth., Disp: , Rfl:    propranolol (INDERAL) 40 MG tablet, Take 1 tablet (40 mg total) by mouth 2 (two) times daily., Disp: 90 tablet, Rfl: 3   sertraline (ZOLOFT) 100 MG tablet, Take 1.5 tablets (150 mg total) by mouth daily., Disp: 135 tablet, Rfl: 3   traZODone (DESYREL) 50 MG tablet, Take 1 tablet (50 mg total) by mouth at bedtime., Disp: 90 tablet, Rfl: 3   Medications ordered in this encounter:  Meds ordered this encounter  Medications   amoxicillin-clavulanate (AUGMENTIN) 875-125 MG tablet    Sig: Take 1 tablet by mouth 2 (two) times daily.    Dispense:  20 tablet    Refill:  0    Order Specific Question:   Supervising Provider    Answer:   MILLER, BRIAN [[id number]]    benzonatate (TESSALON) 100 MG capsule    Sig: Take 1 capsule (100 mg total) by mouth 2 (two) times daily as needed for cough.    Dispense:  20 capsule    Refill:  0    Order Specific Question:   Supervising Provider    Answer:   Hyacinth Meeker, BRIAN [[id number]]     *If you need refills on other medications prior to your next appointment, please contact your pharmacy*  Follow-Up: Call back or seek an in-person evaluation if the symptoms worsen or if the condition fails to improve as anticipated.  Other Instructions Get plenty of rest and push fluids Augmentin prescribed.  Take as directed and to completion Tessalon Perles prescribed for cough Use OTC zyrtec for nasal congestion, runny nose, and/or sore throat Use OTC flonase for nasal congestion and runny nose Use medications daily for symptom relief Use OTC medications like ibuprofen or tylenol as needed fever or pain Follow up in person with urgent care or go to the ED if you have any new or worsening symptoms such as fever, worsening cough, shortness of breath, chest tightness, chest pain, turning blue, changes in mental status, etc...    If you have been instructed to have an in-person evaluation today at a local Urgent Care facility, please use the link below. It will take you to a list of all  of our available [facility] Urgent Cares, including address, phone number and hours of operation. Please do not delay care.  [facility]  If you or a family member do not have a primary care provider, use the link below to schedule a visit and establish care. When you choose a [facility] primary care physician or advanced practice provider, you gain a long-term partner in health. Find a Primary Care Provider  Learn more about [facility]'s in-office and virtual care options: [facility]
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
## 2021-08-23 ENCOUNTER — Telehealth: Payer: Self-pay | Admitting: Emergency Medicine

## 2021-08-23 ENCOUNTER — Ambulatory Visit (INDEPENDENT_AMBULATORY_CARE_PROVIDER_SITE_OTHER): Payer: No Payment, Other | Admitting: Licensed Clinical Social Worker

## 2021-08-23 DIAGNOSIS — K0889 Other specified disorders of teeth and supporting structures: Secondary | ICD-10-CM

## 2021-08-23 DIAGNOSIS — F418 Other specified anxiety disorders: Secondary | ICD-10-CM

## 2021-08-23 DIAGNOSIS — B0229 Other postherpetic nervous system involvement: Secondary | ICD-10-CM

## 2021-08-23 MED ORDER — GABAPENTIN 300 MG PO CAPS: 300.0000 mg | ORAL_CAPSULE | Freq: Two times a day (BID) | ORAL | 0 refills | Status: AC
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
## 2021-08-23 NOTE — Patient Instructions (Signed)
Sandra Herring, thank you for joining Cathlyn Parsons, NP for today's virtual visit.  While this provider is not your primary care provider (PCP), if your PCP is located in our provider database this encounter information will be shared with them immediately following your visit.  Consent: (Patient) Sandra Herring provided verbal consent for this virtual visit at the beginning of the encounter.  Current Medications:  Current Outpatient Medications:    gabapentin (NEURONTIN) 300 MG capsule, Take 1 capsule (300 mg total) by mouth 2 (two) times daily., Disp: 60 capsule, Rfl: 0   amoxicillin-clavulanate (AUGMENTIN) 875-125 MG tablet, Take 1 tablet by mouth 2 (two) times daily., Disp: 20 tablet, Rfl: 0   Ascorbic Acid (VITAMIN C) 1000 MG tablet, Take 4,000 mg by mouth daily., Disp: , Rfl:    benzonatate (TESSALON) 100 MG capsule, Take 1 capsule (100 mg total) by mouth 2 (two) times daily as needed for cough., Disp: 20 capsule, Rfl: 0   calcium carbonate (TUMS - DOSED IN MG ELEMENTAL CALCIUM) 500 MG chewable tablet, Chew by mouth., Disp: , Rfl:    Cod Liver Oil OIL, Take by mouth., Disp: , Rfl:    diazepam (VALIUM) 5 MG tablet, Take one tab at bed time, Disp: 30 tablet, Rfl: 1   famotidine (PEPCID) 20 MG tablet, Take 20 mg by mouth daily as needed for heartburn. , Disp: , Rfl:    Misc Natural Products (GINSENG-AMERICAN PO), Take by mouth., Disp: , Rfl:    propranolol (INDERAL) 40 MG tablet, Take 1 tablet (40 mg total) by mouth 2 (two) times daily., Disp: 90 tablet, Rfl: 3   sertraline (ZOLOFT) 100 MG tablet, Take 1.5 tablets (150 mg total) by mouth daily., Disp: 135 tablet, Rfl: 3   traZODone (DESYREL) 50 MG tablet, Take 1 tablet (50 mg total) by mouth at bedtime., Disp: 90 tablet, Rfl: 3   Medications ordered in this encounter:  Meds ordered this encounter  Medications   gabapentin (NEURONTIN) 300 MG capsule    Sig: Take 1 capsule (300 mg total) by mouth 2 (two) times daily.     Dispense:  60 capsule    Refill:  0     *If you need refills on other medications prior to your next appointment, please contact your pharmacy*  Follow-Up: Call back or seek an in-person evaluation if the symptoms worsen or if the condition fails to improve as anticipated.  Other Instructions Follow up with a dentist as soon as possible about your broken tooth:   [facility] [phone] [facility] [phone] Ur[facility] [phone] [facility] [phone] [facility] Care [facility] [phone] [facility] [phone] [facility] [phone] [facility] [place] [phone]  Follow up with your primary care provider about your shingles neuropathy   If you have been instructed to have an in-person evaluation today at a local Urgent Care facility, please use the link below. It will take you to a list of all of our available [facility], including address, phone number and hours of operation. Please do not delay care.  [facility]  If you or a family member do not have a primary care provider, use the link below to schedule a visit and establish care. When you choose a [facility] primary care physician or advanced practice provider, you gain a long-term partner in health. Find a Primary Care Provider  Learn more about [facility]'s in-office and virtual care options:  -  Get Care Now

## 2021-08-29 NOTE — Progress Notes (Signed)
THERAPIST PROGRESS NOTE   Virtual Visit via Video Note  I connected with Sandra Herring on 08/23/21 at  9:00 AM EDT by a video enabled telemedicine application and verified that I am speaking with the correct person using two identifiers.  Location: Patient: Home Provider: Home   I discussed the limitations of evaluation and management by telemedicine and the availability of in person appointments. The patient expressed understanding and agreed to proceed. I discussed the assessment and treatment plan with the patient. The patient was provided an opportunity to ask questions and all were answered. The patient agreed with the plan and demonstrated an understanding of the instructions.   The patient was advised to call back or seek an in-person evaluation if the symptoms worsen or if the condition fails to improve as anticipated.  I provided 55 minutes of non-face-to-face time during this encounter.  Participation Level: Active  Behavioral Response: CasualAlertAnxious and Depressed  Type of Therapy: Individual Therapy  Treatment Goals addressed: Communication: anx/dep/coping  Interventions: CBT and Supportive  Summary: [name] is a 60 y.o. female who presents with hx of anx/dep.  Patient returns for video session this date.  Patient last seen August 15 due to patient canceling appointment in September and clinician being unexpectedly absent in October.  Patient has ongoing signs and symptoms of anxiety and depression. Patient states she has been very sick for 3 weeks with an upper respiratory virus, fever and vomiting. Patient has poor eye contact and poor speech flow at times during session.  She coughs intermittently throughout session as well. Patient advised she is taking meds as prescribed.  Patient reports her relationship with [name] continues and he has been living with her for "months".  Patient reports his car broke down, he does not have the money to repair it  although he is continuing to pay his rent in [place].  Patient reports his only income is from giving plasma.  Patient states it is very helpful and comforting to have John in her home and then also states she misses having time alone, worried about finances.  Patient reports she has told John to stop paying his rent in [place] and just move in with her.  Patient reports one of her primary concerns at this time is she still has no answer on the continuation of her disability payments.  She advises this is making her very frustrated and "scared".  Patient reports she was sent to see an eye doctor in [place] a week ago and is hopeful this will support her need for ongoing disability.  LCSW assessed for status of relationship with adult children.  Patient advises she did stop texting her daughter's daily after last session, which has been helpful for her.  Patient reports she is in contact by phone with her son [name] on a regular basis at this point.  LCSW assessed for patient's plan should [name] show up at her home and ask to stay.  She reports she has not thought of a plan but will spend time considering how to address this should it come up.  LCSW provided instruction on self guided imagery using all senses for coping as pt cannot get to [place] with changing weather.  She continues to get support from the family [facility]. Pt using Daily Calm and other online coping supports. LCSW reviewed poc including scheduling prior to close of session. Pt states appreciation for care.   Suicidal/Homicidal: Nowithout intent/plan  Therapist Response: Pt receptive to  care.   Plan: Return again for next avail appt.  Diagnosis: Axis I:  Anxiety with depression   [place], LCSW 08/29/2021

## 2021-09-16 ENCOUNTER — Telehealth (HOSPITAL_COMMUNITY): Payer: Self-pay | Admitting: Psychiatry

## 2021-09-16 NOTE — Telephone Encounter (Signed)
Patient called for REFILL:  VALIUM 5MG  Pharmacy :  [facility] Patient phone number: [phone] Last seen:  06/24/21 COMMENTS:   Currently in [facility] caring for family. RX LAST WRITTEN:  04/23/21 Dr. 04/25/21

## 2021-09-17 ENCOUNTER — Other Ambulatory Visit (HOSPITAL_COMMUNITY): Payer: Self-pay | Admitting: Psychiatry

## 2021-09-17 ENCOUNTER — Telehealth (HOSPITAL_COMMUNITY): Payer: Self-pay | Admitting: *Deleted

## 2021-09-17 DIAGNOSIS — F4310 Post-traumatic stress disorder, unspecified: Secondary | ICD-10-CM

## 2021-09-17 DIAGNOSIS — F411 Generalized anxiety disorder: Secondary | ICD-10-CM

## 2021-09-17 MED ORDER — DIAZEPAM 5 MG PO TABS: ORAL_TABLET | ORAL | 1 refills | Status: DC

## 2021-09-17 NOTE — Telephone Encounter (Signed)
Call from patient who is in [place] caring for her mom since her mom lives with her sister and her sister has recently suffered a stroke. She is requesting her Valium be called in to a pharmacy in [facility]. Explained that was not possible, the NP is not able to prescribe out of [place]. She decided knowing this now, she would have a friend mail it to her overnight.

## 2021-09-17 NOTE — Telephone Encounter (Signed)
Medication refilled and sent to preferred pharmacy

## 2021-09-26 ENCOUNTER — Telehealth (INDEPENDENT_AMBULATORY_CARE_PROVIDER_SITE_OTHER): Payer: No Payment, Other | Admitting: Psychiatry

## 2021-09-26 ENCOUNTER — Encounter (HOSPITAL_COMMUNITY): Payer: Self-pay | Admitting: Psychiatry

## 2021-09-26 DIAGNOSIS — F4310 Post-traumatic stress disorder, unspecified: Secondary | ICD-10-CM

## 2021-09-26 DIAGNOSIS — F411 Generalized anxiety disorder: Secondary | ICD-10-CM

## 2021-09-26 DIAGNOSIS — F1994 Other psychoactive substance use, unspecified with psychoactive substance-induced mood disorder: Secondary | ICD-10-CM

## 2021-09-26 MED ORDER — SERTRALINE HCL 100 MG PO TABS: 150.0000 mg | ORAL_TABLET | Freq: Every day | ORAL | 3 refills | Status: DC

## 2021-09-26 MED ORDER — TRAZODONE HCL 50 MG PO TABS: 50.0000 mg | ORAL_TABLET | Freq: Every day | ORAL | 3 refills | Status: DC
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
## 2021-10-29 ENCOUNTER — Encounter: Payer: Self-pay | Admitting: Family Medicine

## 2021-10-29 ENCOUNTER — Ambulatory Visit: Payer: Self-pay | Attending: Family Medicine | Admitting: Family Medicine

## 2021-10-29 ENCOUNTER — Other Ambulatory Visit: Payer: Self-pay

## 2021-10-29 DIAGNOSIS — F411 Generalized anxiety disorder: Secondary | ICD-10-CM

## 2021-10-29 DIAGNOSIS — I10 Essential (primary) hypertension: Secondary | ICD-10-CM

## 2021-10-29 DIAGNOSIS — G5622 Lesion of ulnar nerve, left upper limb: Secondary | ICD-10-CM

## 2021-10-29 MED ORDER — PROPRANOLOL HCL 40 MG PO TABS: 40.0000 mg | ORAL_TABLET | Freq: Two times a day (BID) | ORAL | 3 refills | Status: AC

## 2021-10-29 MED ORDER — PREDNISONE 20 MG PO TABS: 20.0000 mg | ORAL_TABLET | Freq: Every day | ORAL | 0 refills | Status: AC

## 2021-10-29 NOTE — Progress Notes (Signed)
Virtual Visit via Telephone Note  I connected with Sandra Herring, on 10/29/2021 at 11:04 AM by telephone due to the COVID-19 pandemic and verified that I am speaking with the correct person using two identifiers.   Consent: I discussed the limitations, risks, security and privacy concerns of performing an evaluation and management service by telephone and the availability of in person appointments. I also discussed with the patient that there may be a patient responsible charge related to this service. The patient expressed understanding and agreed to proceed.   Location of Patient: [facility]arry manager of Provider: Clinic   Persons participating in Telemedicine visit: Sandra Herring-CMA Dr. Alvis Lemmings     History of Present Illness: Sandra Herring is a 61 y.o. year old female with history of anxiety and depression, PTSD, borderline personality disorder. She informs me she is not happy I had documented THC abuse in my previous note. She states she uses THC because she has borderline personality to regulate her and she obtains it from a dispensary in [place]. She states she does not abuse THC.  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  Upon review of her chart I see the referral coordinator had sent the patient a letter to apply for the [facility] discount in [place] card so she can be referred to dermatology.  The patient states she never received the letter and also complains of poor vision and the fact that she will be unable to do much reading due to this.  She would rather people call her instead of mailing her an letter.  Complains of 'Ulnar tunnel syndrome' in her L arm from her mid upper arm down to lateral 3 fingers with associated burning pain which makes her miserable.  She is unable to tolerate  gabapentin.  Also has a wrist brace. She has had L wrist release surgery by Dr Merlyn Lot in the past. She is not happy she took a cab to the clinic today only to be informed her visit had been changed to virtual visit due to my not feeling well.  I have apologized to her and explained I do have respiratory symptoms and it would not be wise to have an in person visit with her.   Past Medical History:  Diagnosis Date   Anxiety    Blind    Depression    GERD (gastroesophageal reflux disease)    Headache    migraines takes toprol    PTSD (post-traumatic stress disorder)    PTSD (post-traumatic stress disorder)    Seizures (HCC)    febrile seizure at age 79 months   Thyroid nodule    Vaginal infection    Allergies  Allergen Reactions   Serzone [Nefazodone] Shortness Of Breath    Breathing difficulty   Nsaids Other (See Comments)    gastritis   Lamictal [Lamotrigine] Itching and Rash    Current Outpatient Medications on File Prior to Visit  Medication Sig Dispense Refill   amoxicillin-clavulanate (AUGMENTIN) 875-125 MG tablet Take 1 tablet by mouth 2 (two) times daily. 20 tablet 0   Ascorbic Acid (VITAMIN C) 1000 MG tablet Take 4,000 mg by mouth daily.     benzonatate (TESSALON) 100 MG capsule Take 1 capsule (100 mg total) by mouth 2 (two) times daily as needed for cough. 20 capsule  0   calcium carbonate (TUMS - DOSED IN MG ELEMENTAL CALCIUM) 500 MG chewable tablet Chew by mouth.     Cod Liver Oil OIL Take by mouth.     diazepam (VALIUM) 5 MG tablet Take one tab at bed time 30 tablet 1   famotidine (PEPCID) 20 MG tablet Take 20 mg by mouth daily as needed for heartburn.      gabapentin (NEURONTIN) 300 MG capsule Take 1 capsule (300 mg total) by mouth 2 (two) times daily. 60 capsule 0   Misc Natural Products (GINSENG-AMERICAN PO) Take by mouth.     propranolol (INDERAL) 40 MG tablet Take 1 tablet (40 mg total) by mouth 2 (two) times daily. 90 tablet 3   sertraline (ZOLOFT) 100 MG tablet  Take 1.5 tablets (150 mg total) by mouth daily. 135 tablet 3   traZODone (DESYREL) 50 MG tablet Take 1 tablet (50 mg total) by mouth at bedtime. 90 tablet 3   No current facility-administered medications on file prior to visit.    ROS: See HPI  Observations/Objective: Awake, alert, oriented x3 Not in acute distress Normal mood   CMP Latest Ref Rng & Units 05/12/2020 11/21/2019 12/10/2017  Glucose 70 - 99 mg/dL 191(Y150(H) 782(N120(H) 562(Z103(H)  BUN 6 - 20 mg/dL 9 11 7   Creatinine 0.44 - 1.00 mg/dL 3.080.94 6.570.89 8.460.80  Sodium 135 - 145 mmol/L 135 141 139  Potassium 3.5 - 5.1 mmol/L 3.9 4.1 3.9  Chloride 98 - 111 mmol/L 102 103 105  CO2 22 - 32 mmol/L 22 25 23   Calcium 8.9 - 10.3 mg/dL 8.9 9.6 9.4  Total Protein 6.5 - 8.1 g/dL 6.9 - -  Total Bilirubin 0.3 - 1.2 mg/dL 0.4 - -  Alkaline Phos 38 - 126 U/L 78 - -  AST 15 - 41 U/L 14(L) - -  ALT 0 - 44 U/L 22 - -    Lipid Panel  No results found for: CHOL, TRIG, HDL, CHOLHDL, VLDL, LDLCALC, LDLDIRECT, LABVLDL  Lab Results  Component Value Date   HGBA1C 5.6 07/29/2021    Assessment and Plan: 1. GAD (generalized anxiety disorder) Stable Management as per mental health - propranolol (INDERAL) 40 MG tablet; Take 1 tablet (40 mg total) by mouth 2 (two) times daily.  Dispense: 90 tablet; Refill: 3  2. Essential hypertension Controlled Counseled on blood pressure goal of less than 130/80, low-sodium, DASH diet, medication compliance, 150 minutes of moderate intensity exercise per week. Discussed medication compliance, adverse effects. - propranolol (INDERAL) 40 MG tablet; Take 1 tablet (40 mg total) by mouth 2 (two) times daily.  Dispense: 90 tablet; Refill: 3  3. Ulnar tunnel syndrome of left wrist Unable to tolerate gabapentin Short course of prednisone.  She states she does have some out of body experiences with prednisone but states she cannot take it since she will be home and is not planning to go anywhere.  We have discussed adverse effects  of prednisone. Advised to reach out to me if symptoms are uncontrolled for possible orthopedic referral Continue with wrist brace. - predniSONE (DELTASONE) 20 MG tablet; Take 1 tablet (20 mg total) by mouth daily with breakfast.  Dispense: 5 tablet; Refill: 0   Advised to obtain paperwork for the [facility] discount/ [place] card at the front desk.  This will facilitate referral to dermatology and orthopedic.  Follow Up Instructions: Keep previously scheduled appointment.   I discussed the assessment and treatment plan with the patient. The patient was provided an  opportunity to ask questions and all were answered. The patient agreed with the plan and demonstrated an understanding of the instructions.   The patient was advised to call back or seek an in-person evaluation if the symptoms worsen or if the condition fails to improve as anticipated.     I provided 14 minutes total of non-face-to-face time during this encounter.   Hoy Register, MD, FAAFP. [facility] [place], [place] [phone]   10/29/2021, 11:04 AM

## 2021-11-06 ENCOUNTER — Ambulatory Visit (INDEPENDENT_AMBULATORY_CARE_PROVIDER_SITE_OTHER): Payer: No Payment, Other | Admitting: Licensed Clinical Social Worker

## 2021-11-06 DIAGNOSIS — F411 Generalized anxiety disorder: Secondary | ICD-10-CM

## 2021-11-06 DIAGNOSIS — F332 Major depressive disorder, recurrent severe without psychotic features: Secondary | ICD-10-CM
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
## 2021-11-18 ENCOUNTER — Other Ambulatory Visit (HOSPITAL_COMMUNITY): Payer: Self-pay | Admitting: Psychiatry

## 2021-11-18 DIAGNOSIS — F4310 Post-traumatic stress disorder, unspecified: Secondary | ICD-10-CM

## 2021-11-18 DIAGNOSIS — F411 Generalized anxiety disorder: Secondary | ICD-10-CM

## 2021-11-19 ENCOUNTER — Telehealth (HOSPITAL_COMMUNITY): Payer: Self-pay | Admitting: Psychiatry

## 2021-11-19 ENCOUNTER — Telehealth (HOSPITAL_COMMUNITY): Payer: Self-pay | Admitting: *Deleted

## 2021-11-19 NOTE — Telephone Encounter (Signed)
Medication refilled and sent to preferred pharmacy

## 2021-11-19 NOTE — Telephone Encounter (Signed)
Last dose VALUM should have been 11/17/21 per prescription.  Please send today due to pt leaving today to [facility] and it cannot be called in to [facility].

## 2021-11-19 NOTE — Telephone Encounter (Signed)
Pt delayed Pick up on last refill on script until 10/27/21 due to being out of state, so pharmacy here says provider will have to call them to authorize.

## 2021-11-19 NOTE — Telephone Encounter (Signed)
Sandra Herring has called twice this am before 9 to request her Valium be called in today as she is out, and is going to [facility] to help with some family matters and needs her medicine before she leaves. Will notify Dr Doyne Keel to esign it in for her. She was seen yesterday by the provider.

## 2021-11-27 ENCOUNTER — Ambulatory Visit (HOSPITAL_COMMUNITY): Payer: No Payment, Other | Admitting: Licensed Clinical Social Worker

## 2021-11-27 ENCOUNTER — Encounter (HOSPITAL_COMMUNITY): Payer: Self-pay

## 2021-11-27 ENCOUNTER — Telehealth (HOSPITAL_COMMUNITY): Payer: Self-pay | Admitting: Licensed Clinical Social Worker

## 2021-11-27 NOTE — Telephone Encounter (Signed)
LCSW sent text message for video session per schedule. Pt signs on and is in [facility] visiting fam. LCSW advised of licensing restrictions since she is out of the state of [place]. Pt displeased with regulations but ultimately verbalizes acceptance. LCSW provided info on next appts. Session not completed.

## 2021-12-19 ENCOUNTER — Telehealth (INDEPENDENT_AMBULATORY_CARE_PROVIDER_SITE_OTHER): Payer: No Payment, Other | Admitting: Psychiatry

## 2021-12-19 ENCOUNTER — Encounter (HOSPITAL_COMMUNITY): Payer: Self-pay | Admitting: Psychiatry

## 2021-12-19 DIAGNOSIS — F4310 Post-traumatic stress disorder, unspecified: Secondary | ICD-10-CM | POA: Diagnosis not present

## 2021-12-19 DIAGNOSIS — I10 Essential (primary) hypertension: Secondary | ICD-10-CM

## 2021-12-19 DIAGNOSIS — F1994 Other psychoactive substance use, unspecified with psychoactive substance-induced mood disorder: Secondary | ICD-10-CM | POA: Diagnosis not present

## 2021-12-19 DIAGNOSIS — F411 Generalized anxiety disorder: Secondary | ICD-10-CM

## 2021-12-19 MED ORDER — TRAZODONE HCL 50 MG PO TABS: 50.0000 mg | ORAL_TABLET | Freq: Every day | ORAL | 3 refills | Status: AC

## 2021-12-19 MED ORDER — SERTRALINE HCL 100 MG PO TABS: 200.0000 mg | ORAL_TABLET | Freq: Every day | ORAL | 3 refills | Status: AC

## 2021-12-19 MED ORDER — DIAZEPAM 2 MG PO TABS: ORAL_TABLET | ORAL | 0 refills | Status: AC
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
## 2022-01-10 ENCOUNTER — Ambulatory Visit (HOSPITAL_COMMUNITY): Payer: No Payment, Other | Admitting: Licensed Clinical Social Worker

## 2022-01-21 ENCOUNTER — Telehealth (HOSPITAL_COMMUNITY): Payer: Self-pay | Admitting: Psychiatry

## 2022-01-21 NOTE — Telephone Encounter (Signed)
Patient contacted the office requesting to speak with a medical director or office manager. Writer requested name and number and reason for request. Patient stated that she would like to speak with someone regarding the way some things have happened and were handles, specific incidents mentioned. Patient will be expecting a call from Dr. Lucianne Muss, Minus Liberty, or Arlys John at earliest convenience at [phone]. ?

## 2022-01-22 NOTE — Telephone Encounter (Signed)
Medication management - Telephone message left for patient that this nurse manager did speak to Dr. Lucianne Muss, medical director and shared her concerns about the way her services ended and her transition issues to providers in [place]. Informed Dr. Lucianne Muss had reviewed her record and could not assist with sending in a new Valium order to a pharmacy in [place] with a higher dosage since out so state, they would be taking over her treatment there, and not best practice to go back up on something since she could not see patient for the evaluation.  Informed patient to call back if any questions or more concerns and also shared Dr. Lucianne Muss would follow up with issues shared.  Encouraged patient to keep psychiatric appointment there in [place] as she had stated earlier today, already being now established with a new therapist and would be seeing a nurse 01/23/22.   ?

## 2022-01-22 NOTE — Telephone Encounter (Signed)
Medication management - Telephone call from pt, as she called this nurse manager back to report concerns for how her services ended with nurse practitioner and assigned counselor from the [facility].  Patient stated when she last saw NP, that her Diazepam was reduced from 5 mg down to 2 mg at night.  Patient stated she felt this was abrupt and feels was lowered only because patient informed NP she was moving to live in [place] with family.  Patient also was upset when she last spoke to her assigned counselor, she admitted she was already in [place] and was told she could not be seen since this was out of state for [employer], [employer].  Patient stated she understood this but reported her NP and counselor both knew for months she would most likely be moving but did not prepare her for the abrupt ending of services and changing her medications.  Patient stated this had negatively affected her and caused her much more anxiety, losing her therapist and also not having the antianxiety medication dosage she reported she had been on for over 10 years.  Patient stated she has reestablished a new therapist there in [place], as she has already started seeing them and has an appointment with a nurse at a mental health office scheduled for 02/23/22; however, she still feels she should be returned to her previous dosage of Diazepam.  Agreed to discuss with medical director, per patient request, but informed with her now moved, even though patient reports having an apartment still in [place], she acknowledges she has moved, is currently living in there with no plans of when she will return.  Patient stated understanding but just wanted someone "to advocate" for her due to the way her transition was handled.  Agreed to call patient back and would follow up with her concerns to share with our providers and therapists when patients are leaving their services.  Shared with patient  that her recent therapist was now leaving our practice for another position so if she returns we would have to set her up with someone else.  Patient will keep [place] posted, will reach out to schedule appropriate appointments if she returns to living in the area.  ?

## 2022-01-22 NOTE — Telephone Encounter (Signed)
Medication management - Message left for patient on her identifiable voicemail, to attempt to assess patient concerns.  Requested patient call this nurse manager back and provided direct office number.  Informed if she got a message to leave some times she may be available for a return call.  Per counselor cancellation on 01/10/22, patient had moved to [place].  Will confirm with follow up call when able to connect with patient.   ?

## 2022-01-23 ENCOUNTER — Telehealth (HOSPITAL_COMMUNITY): Payer: Self-pay

## 2022-01-23 NOTE — Telephone Encounter (Signed)
Patient contacted office, returning call to [facility]. Writer attempted to reach RN, but was unsuccessful. Patient was informed that he was unavailable, but was offered to leave a voicemail on his line. Patient was transferred to Rn's line to leave VM for return call.  ?

## 2022-01-29 NOTE — Telephone Encounter (Signed)
Medication refill request - Message left on patient's voicemail, after she left another message today, that this nurse did meet and follow up with Dr. Lucianne Muss on her request for a new Alprazolam order.  Patient requested this order be sent into a pharmacy in [place], [place] as patient had stated the pharmacy there was approximately 45 minutes from where she is now living in [place]. Informed patient on message that the issues was not just that Dr, Lucianne Muss does not have license to practice in [place] and therefore why she would not agree to this request but that knowing patient has now taken up residence in [place] and reported that she has established therapy sessions and was seeing a nurse from a psychiatric facility on 01/23/22, that she could not continue to assist with orders and treatment with patient living outside the state of [place].  Informed patient she would have to follow up with her new provider there for this request and requested she call back if any other concerns or questions.   ?

## 2022-01-29 NOTE — Telephone Encounter (Signed)
Patient left a voicemail on office phone during lunch hours. Writer contacted patient to follow-up. Patient states that she is becoming upset and anxious about her medications still not being refilled. Patient was informed that I did review her chart to see she has been communicating with nurse manager regarding this matter. Writer informed that this message will be sent to nurse manager to follow-up again. She is wanting her Diazepam refilled at 5mg , as it was before.  ?

## 2022-01-31 ENCOUNTER — Ambulatory Visit (HOSPITAL_COMMUNITY): Payer: Self-pay | Admitting: Licensed Clinical Social Worker
